# Patient Record
Sex: Male | Born: 1970 | Race: White | Hispanic: No | Marital: Married | Smoking: Current every day smoker
Health system: Southern US, Community
[De-identification: ages and names within clinical notes are randomized; demographics above are authoritative.]

## PROBLEM LIST (undated history)

## (undated) ENCOUNTER — Emergency Department (HOSPITAL_COMMUNITY)

## (undated) DIAGNOSIS — J449 Chronic obstructive pulmonary disease, unspecified: Secondary | ICD-10-CM

## (undated) DIAGNOSIS — J45909 Unspecified asthma, uncomplicated: Secondary | ICD-10-CM

## (undated) DIAGNOSIS — T7840XA Allergy, unspecified, initial encounter: Secondary | ICD-10-CM

## (undated) DIAGNOSIS — E7800 Pure hypercholesterolemia, unspecified: Secondary | ICD-10-CM

## (undated) DIAGNOSIS — J189 Pneumonia, unspecified organism: Secondary | ICD-10-CM

## (undated) HISTORY — DX: Unspecified asthma, uncomplicated: J45.909

## (undated) HISTORY — PX: OTHER SURGICAL HISTORY: SHX169

## (undated) HISTORY — DX: Allergy, unspecified, initial encounter: T78.40XA

## (undated) HISTORY — PX: LEG SURGERY: SHX1003

---

## 2001-02-25 ENCOUNTER — Emergency Department (HOSPITAL_COMMUNITY): Admission: EM | Admit: 2001-02-25 | Discharge: 2001-02-26 | Payer: Self-pay | Admitting: Emergency Medicine

## 2001-02-26 ENCOUNTER — Encounter: Payer: Self-pay | Admitting: Internal Medicine

## 2002-06-18 ENCOUNTER — Emergency Department (HOSPITAL_COMMUNITY): Admission: EM | Admit: 2002-06-18 | Discharge: 2002-06-19 | Payer: Self-pay | Admitting: Internal Medicine

## 2002-07-11 ENCOUNTER — Emergency Department (HOSPITAL_COMMUNITY): Admission: EM | Admit: 2002-07-11 | Discharge: 2002-07-11 | Payer: Self-pay | Admitting: Emergency Medicine

## 2005-02-10 ENCOUNTER — Emergency Department (HOSPITAL_COMMUNITY): Admission: EM | Admit: 2005-02-10 | Discharge: 2005-02-10 | Payer: Self-pay | Admitting: Emergency Medicine

## 2005-04-18 ENCOUNTER — Emergency Department (HOSPITAL_COMMUNITY): Admission: EM | Admit: 2005-04-18 | Discharge: 2005-04-18 | Payer: Self-pay | Admitting: Emergency Medicine

## 2005-05-11 ENCOUNTER — Emergency Department (HOSPITAL_COMMUNITY): Admission: EM | Admit: 2005-05-11 | Discharge: 2005-05-11 | Payer: Self-pay | Admitting: Emergency Medicine

## 2006-10-29 ENCOUNTER — Emergency Department (HOSPITAL_COMMUNITY): Admission: EM | Admit: 2006-10-29 | Discharge: 2006-10-29 | Payer: Self-pay | Admitting: Emergency Medicine

## 2007-02-20 ENCOUNTER — Emergency Department (HOSPITAL_COMMUNITY): Admission: EM | Admit: 2007-02-20 | Discharge: 2007-02-20 | Payer: Self-pay | Admitting: Emergency Medicine

## 2007-09-12 ENCOUNTER — Emergency Department (HOSPITAL_COMMUNITY): Admission: EM | Admit: 2007-09-12 | Discharge: 2007-09-12 | Payer: Self-pay | Admitting: Emergency Medicine

## 2008-02-01 ENCOUNTER — Ambulatory Visit (HOSPITAL_COMMUNITY): Admission: RE | Admit: 2008-02-01 | Discharge: 2008-02-01 | Payer: Self-pay | Admitting: Family Medicine

## 2008-04-11 ENCOUNTER — Emergency Department (HOSPITAL_COMMUNITY): Admission: EM | Admit: 2008-04-11 | Discharge: 2008-04-11 | Payer: Self-pay | Admitting: Emergency Medicine

## 2009-12-07 ENCOUNTER — Emergency Department (HOSPITAL_COMMUNITY): Admission: EM | Admit: 2009-12-07 | Discharge: 2009-12-07 | Payer: Self-pay | Admitting: Emergency Medicine

## 2010-09-19 ENCOUNTER — Ambulatory Visit (HOSPITAL_COMMUNITY): Admission: RE | Admit: 2010-09-19 | Discharge: 2010-09-19 | Payer: Self-pay | Admitting: Family Medicine

## 2011-02-21 ENCOUNTER — Emergency Department (HOSPITAL_COMMUNITY)
Admission: EM | Admit: 2011-02-21 | Discharge: 2011-02-22 | Disposition: A | Discharge status: Home / Self Care | Payer: PRIVATE HEALTH INSURANCE | Attending: Emergency Medicine | Admitting: Emergency Medicine

## 2011-02-21 DIAGNOSIS — Y929 Unspecified place or not applicable: Secondary | ICD-10-CM | POA: Insufficient documentation

## 2011-02-21 DIAGNOSIS — W57XXXA Bitten or stung by nonvenomous insect and other nonvenomous arthropods, initial encounter: Secondary | ICD-10-CM | POA: Insufficient documentation

## 2011-02-21 DIAGNOSIS — S90569A Insect bite (nonvenomous), unspecified ankle, initial encounter: Secondary | ICD-10-CM | POA: Insufficient documentation

## 2011-02-21 DIAGNOSIS — L089 Local infection of the skin and subcutaneous tissue, unspecified: Secondary | ICD-10-CM | POA: Insufficient documentation

## 2011-02-21 DIAGNOSIS — S30860A Insect bite (nonvenomous) of lower back and pelvis, initial encounter: Secondary | ICD-10-CM | POA: Insufficient documentation

## 2011-03-10 LAB — CBC
Hemoglobin: 16.2 g/dL (ref 13.0–17.0)
RDW: 13 % (ref 11.5–15.5)

## 2011-03-10 LAB — DIFFERENTIAL
Basophils Absolute: 0.1 10*3/uL (ref 0.0–0.1)
Lymphocytes Relative: 16 % (ref 12–46)
Monocytes Absolute: 0.6 10*3/uL (ref 0.1–1.0)
Neutro Abs: 8.5 10*3/uL — ABNORMAL HIGH (ref 1.7–7.7)
Neutrophils Relative %: 77 % (ref 43–77)

## 2011-03-10 LAB — BASIC METABOLIC PANEL
BUN: 5 mg/dL — ABNORMAL LOW (ref 6–23)
GFR calc non Af Amer: >60 mL/min (ref >60)
Potassium: 3.9 mEq/L (ref 3.5–5.1)
Sodium: 140 mEq/L (ref 135–145)
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
## 2012-08-13 ENCOUNTER — Emergency Department (HOSPITAL_COMMUNITY): Payer: PRIVATE HEALTH INSURANCE

## 2012-08-13 ENCOUNTER — Encounter (HOSPITAL_COMMUNITY): Payer: Self-pay | Admitting: *Deleted

## 2012-08-13 ENCOUNTER — Emergency Department (HOSPITAL_COMMUNITY)
Admission: EM | Admit: 2012-08-13 | Discharge: 2012-08-13 | Disposition: A | Discharge status: Home / Self Care | Payer: PRIVATE HEALTH INSURANCE | Attending: Emergency Medicine | Admitting: Emergency Medicine

## 2012-08-13 DIAGNOSIS — F17200 Nicotine dependence, unspecified, uncomplicated: Secondary | ICD-10-CM | POA: Insufficient documentation

## 2012-08-13 DIAGNOSIS — IMO0002 Reserved for concepts with insufficient information to code with codable children: Secondary | ICD-10-CM | POA: Insufficient documentation

## 2012-08-13 DIAGNOSIS — S9030XA Contusion of unspecified foot, initial encounter: Secondary | ICD-10-CM

## 2012-08-13 DIAGNOSIS — S90819A Abrasion, unspecified foot, initial encounter: Secondary | ICD-10-CM

## 2012-08-13 DIAGNOSIS — Y92009 Unspecified place in unspecified non-institutional (private) residence as the place of occurrence of the external cause: Secondary | ICD-10-CM | POA: Insufficient documentation

## 2012-08-13 DIAGNOSIS — W208XXA Other cause of strike by thrown, projected or falling object, initial encounter: Secondary | ICD-10-CM | POA: Insufficient documentation

## 2012-08-13 DIAGNOSIS — Z23 Encounter for immunization: Secondary | ICD-10-CM | POA: Insufficient documentation

## 2012-08-13 MED ORDER — TETANUS-DIPHTHERIA TOXOIDS TD 5-2 LFU IM INJ
0.5000 mL | INJECTION | Freq: Once | INTRAMUSCULAR | Status: AC
  Administered 2012-08-13: 0.5 mL via INTRAMUSCULAR
  Filled 2012-08-13: qty 0.5

## 2012-08-13 MED ORDER — HYDROCODONE-ACETAMINOPHEN 5-325 MG PO TABS: ORAL_TABLET | ORAL | Status: DC

## 2012-08-13 MED ORDER — TETANUS-DIPHTH-ACELL PERTUSSIS 5-2.5-18.5 LF-MCG/0.5 IM SUSP: 0.5000 mL | Freq: Once | INTRAMUSCULAR | Status: DC

## 2012-08-13 MED ORDER — TRAMADOL HCL 50 MG PO TABS: 50.0000 mg | ORAL_TABLET | Freq: Four times a day (QID) | ORAL | Status: AC | PRN

## 2012-08-13 MED ORDER — NAPROXEN 500 MG PO TABS: 500.0000 mg | ORAL_TABLET | Freq: Two times a day (BID) | ORAL | Status: DC

## 2012-08-13 NOTE — ED Notes (Signed)
Plywood board fell on pts foot this pm.  Abrasion present.  Alert, NAD

## 2012-08-13 NOTE — ED Notes (Signed)
vicodin dose pack  Given to pt

## 2012-08-13 NOTE — ED Notes (Signed)
Piece of plywood fell on left foot, c/o left foot pain, swelling noted

## 2012-08-13 NOTE — ED Provider Notes (Signed)
History     CSN: [id number]  Arrival date & time 08/13/12  2122   First MD Initiated Contact with Patient 08/13/12 2225      Chief Complaint  Patient presents with  . Foot Pain    (Consider location/radiation/quality/duration/timing/severity/associated sxs/prior treatment) HPI Comments: Patient c/o pain and abrasion to the dorsal left foot.  States a large piece of plywood fell on his foot.  Was wearing sandals at the time.  Pain is worse with weight bearing.  Has not tried anything to help alleviate the pain.  Denies bleeding, numbness or weakness.  Unsure of last tetanus vaccine  Patient is a 41 y.o. male presenting with foot injury. The history is provided by the patient.  Foot Injury  The incident occurred 1 to 2 hours ago. The incident occurred at home. The injury mechanism was a direct blow. The pain is present in the left foot. The quality of the pain is described as aching. The pain is moderate. The pain has been constant since onset. Pertinent negatives include no numbness, no inability to bear weight, no loss of motion, no muscle weakness, no loss of sensation and no tingling. He reports no foreign bodies present. The symptoms are aggravated by activity, bearing weight and palpation. He has tried nothing for the symptoms. The treatment provided no relief.    History reviewed. No pertinent past medical history.  Past Surgical History  Procedure Date  . Arm surgery   . Leg surgery     History reviewed. No pertinent family history.  History  Substance Use Topics  . Smoking status: Current Everyday Smoker  . Smokeless tobacco: Not on file  . Alcohol Use: No      Review of Systems  Constitutional: Negative for fever and chills.  Genitourinary: Negative for dysuria and difficulty urinating.  Musculoskeletal: Positive for joint swelling and arthralgias.  Skin: Positive for wound. Negative for color change.       Abrasion to the left foot  Neurological: Negative for  tingling and numbness.  All other systems reviewed and are negative.    Allergies  Review of patient's allergies indicates no known allergies.  Home Medications  No current outpatient prescriptions on file.  BP 144/76  Pulse 72  Temp 98.2 F (36.8 C) (Oral)  Resp 20  Ht 5\' 5"  (1.651 m)  Wt 215 lb (97.523 kg)  BMI 35.78 kg/m2  SpO2 100%  Physical Exam  Nursing note and vitals reviewed. Constitutional: He is oriented to person, place, and time. He appears well-developed and well-nourished. No distress.  HENT:  Head: Normocephalic and atraumatic.  Cardiovascular: Normal rate, regular rhythm, normal heart sounds and intact distal pulses.   Pulmonary/Chest: Effort normal and breath sounds normal.  Musculoskeletal: He exhibits edema and tenderness.       Left foot: He exhibits tenderness, bony tenderness and swelling. He exhibits normal range of motion, normal capillary refill, no crepitus and no deformity.       Feet:       ttp of the dorsal left foot, mild abrasion present.  moderate STS is present.  ROM is preserved.  DP pulse is brisk, sensation intact.  No erythema, bruising or deformity.    Neurological: He is alert and oriented to person, place, and time. He exhibits normal muscle tone. Coordination normal.  Skin: Skin is warm and dry.    ED Course  Procedures (including critical care time)  Labs Reviewed - No data to display Dg Foot Complete Left  08/13/2012  *RADIOLOGY REPORT*  Clinical Data: Object fell on foot.  Foot pain and swelling.  LEFT FOOT - COMPLETE 3+ VIEW  Comparison: None.  Findings: Soft tissue swelling is seen along the dorsal aspect of the midfoot.  No evidence of fracture or dislocation.  No other significant bone abnormality identified.  No evidence of soft tissue gas or radiopaque foreign body.  IMPRESSION: Dorsal midfoot soft tissue swelling.  No osseous abnormality.   Original Report Authenticated By: Danae Orleans, M.D.       Abrasion cleaned and  bandaged.  MDM    Abrasion to dorsal left foot, with moderate STS.  No focal neuro deficits.  Pt has own crutches.  Agrees to close f/u with orthopedics   The patient appears reasonably screened and/or stabilized for discharge and I doubt any other medical condition or other [facility] requiring further screening, evaluation, or treatment in the ED at this time prior to discharge.    Prescribed: Naprosyn Ultram #15    Kayanna Mckillop L. Lowry Bala, [place] 08/16/12 1720

## 2012-08-17 MED FILL — Hydrocodone-Acetaminophen Tab 5-325 MG: ORAL | Qty: 6 | Status: AC

## 2012-08-19 NOTE — ED Provider Notes (Signed)
Medical screening examination/treatment/procedure(s) were performed by non-physician practitioner and as supervising physician I was immediately available for consultation/collaboration.   Morganna Styles L Bethany Hirt, MD 08/19/12 1027 

## 2012-08-23 ENCOUNTER — Encounter: Payer: Self-pay | Admitting: Orthopedic Surgery

## 2012-08-23 ENCOUNTER — Ambulatory Visit: Payer: PRIVATE HEALTH INSURANCE | Admitting: Orthopedic Surgery

## 2012-08-23 VITALS — BP 116/80 | Ht 65.0 in | Wt 215.0 lb

## 2012-08-23 DIAGNOSIS — S9030XA Contusion of unspecified foot, initial encounter: Secondary | ICD-10-CM | POA: Insufficient documentation

## 2012-08-23 MED ORDER — NAPROXEN 500 MG PO TABS: 500.0000 mg | ORAL_TABLET | Freq: Two times a day (BID) | ORAL | Status: DC

## 2012-08-23 MED ORDER — HYDROCODONE-ACETAMINOPHEN 5-325 MG PO TABS: 1.0000 | ORAL_TABLET | ORAL | Status: DC | PRN

## 2012-08-23 NOTE — Progress Notes (Signed)
Patient ID: Tony Doyle, male   DOB: January 14, 1971, 41 y.o.   MRN: [id number] Chief complaint pain right foot  Date of injury September 6 The patient dropped wood on his foot ER visit negative x-ray foot and ankle Complains of burning 8/10 pain over the dorsum of the foot with swelling and when the swelling is bad numbness and tingling is also or bruise and a superficial laceration/abrasion. There he listed all of his review of systems is normal  The history has been recorded and reviewed  BP 116/80  Ht 5\' 5"  (1.651 m)  Wt 97.523 kg (215 lb)  BMI 35.78 kg/m2 The exam shows a medium to large male adequate grooming. Adequate hygiene. He is oriented x3 his mood and affect are normal exam to the progress crutches nonweightbearing  His left foot has an abrasion on the dorsum is tender and swollen. His ankle range of motion is normal his ankle is stable his muscle tone is normal skin has an abrasion as stated temperature is normal there is mild edema at the ankle lymph nodes are negative sensations intact no pathologic reflexes his balance is good.  X-rays of the ankle and foot are negative  Impression contusion left foot  Recommend short cam walker progressive weightbearing as tolerated use soaks with Epsom salt to control swelling progressive use of crutches and DCS tolerated  If not improved return in 6 weeks

## 2012-08-23 NOTE — Patient Instructions (Addendum)
Walk on foot as tolerated  Apply weight as tolerated in walking boot  Soak foot in warm water with epsom salt do it for 30 minutes  Should get better in 4-6 weeks if not call us back      Foot Contusion A contusion is a deep bruise. Bruises happen when an injury causes bleeding under the skin. Signs of bruising include pain, puffiness (swelling), and discolored skin. The bruise may turn blue, purple, or yellow. A bruised foot may be painful to walk on. Puffiness may make it hard to move the foot. HOME CARE  Put ice on the injured area.   Put ice in a plastic bag.   Place a towel between your skin and the bag.   Leave the ice on for 15 to 20 minutes, 3 to 4 times a day.   Use an elastic bandage to keep the puffiness down.   Keep the foot raised (elevated) above the level of the heart.   Avoid standing or walking on the foot.   Only take medicine as told by your doctor.   Eat healthy.   See your doctor for a follow-up visit as told.  GET HELP RIGHT AWAY IF:    The pain and puffiness get worse.   You have a headache, muscle ache, or you feel dizzy and ill.   The pain is not controlled with medicine.   You have a temperature by mouth above 102 F (38.9 C), not controlled by medicine.   The foot feels warm and it is painful to move your toes.   The bruise is not getting better.   There is yellowish white fluid (pus) coming from the wound.   You lose feeling (numbness) in the foot.   The foot feels cold.   There are new problems.  MAKE SURE YOU:    Understand these instructions.   Will watch this condition.   Will get help right away if you or your child is not doing well or gets worse.  Document Released: 09/02/2008 Document Revised: 11/13/2011 Document Reviewed: 10/28/2011 [facility] Patient Information 2012 [place], [place].

## 2012-08-23 NOTE — Addendum Note (Signed)
Addended by: Vickki Hearing on: 08/23/2012 02:31 PM   Modules accepted: Level of Service

## 2012-09-03 ENCOUNTER — Telehealth: Payer: Self-pay | Admitting: Orthopedic Surgery

## 2012-09-03 NOTE — Telephone Encounter (Signed)
Tony Doyle wants to know how long he needs to wear the brace.  Not scheduled to return unless he is no better. His # [phone]

## 2012-09-29 NOTE — Telephone Encounter (Signed)
No note from doctor °

## 2012-10-08 ENCOUNTER — Other Ambulatory Visit (HOSPITAL_COMMUNITY): Payer: Self-pay | Admitting: Family Medicine

## 2012-10-08 ENCOUNTER — Ambulatory Visit (HOSPITAL_COMMUNITY)
Admission: RE | Admit: 2012-10-08 | Discharge: 2012-10-08 | Disposition: A | Discharge status: Home / Self Care | Payer: PRIVATE HEALTH INSURANCE | Source: Ambulatory Visit | Attending: Family Medicine | Admitting: Family Medicine

## 2012-10-08 DIAGNOSIS — J4489 Other specified chronic obstructive pulmonary disease: Secondary | ICD-10-CM | POA: Insufficient documentation

## 2012-10-08 DIAGNOSIS — J449 Chronic obstructive pulmonary disease, unspecified: Secondary | ICD-10-CM | POA: Insufficient documentation

## 2012-10-08 DIAGNOSIS — F17200 Nicotine dependence, unspecified, uncomplicated: Secondary | ICD-10-CM | POA: Insufficient documentation

## 2012-10-08 DIAGNOSIS — F17210 Nicotine dependence, cigarettes, uncomplicated: Secondary | ICD-10-CM

## 2013-04-17 ENCOUNTER — Emergency Department (HOSPITAL_COMMUNITY)
Admission: EM | Admit: 2013-04-17 | Discharge: 2013-04-17 | Disposition: A | Discharge status: Home / Self Care | Payer: PRIVATE HEALTH INSURANCE | Attending: Emergency Medicine | Admitting: Emergency Medicine

## 2013-04-17 ENCOUNTER — Encounter (HOSPITAL_COMMUNITY): Payer: Self-pay | Admitting: *Deleted

## 2013-04-17 DIAGNOSIS — S81009A Unspecified open wound, unspecified knee, initial encounter: Secondary | ICD-10-CM | POA: Insufficient documentation

## 2013-04-17 DIAGNOSIS — F17200 Nicotine dependence, unspecified, uncomplicated: Secondary | ICD-10-CM | POA: Insufficient documentation

## 2013-04-17 DIAGNOSIS — Y929 Unspecified place or not applicable: Secondary | ICD-10-CM | POA: Insufficient documentation

## 2013-04-17 DIAGNOSIS — W268XXA Contact with other sharp object(s), not elsewhere classified, initial encounter: Secondary | ICD-10-CM | POA: Insufficient documentation

## 2013-04-17 DIAGNOSIS — Z79899 Other long term (current) drug therapy: Secondary | ICD-10-CM | POA: Insufficient documentation

## 2013-04-17 DIAGNOSIS — Y939 Activity, unspecified: Secondary | ICD-10-CM | POA: Insufficient documentation

## 2013-04-17 DIAGNOSIS — L089 Local infection of the skin and subcutaneous tissue, unspecified: Secondary | ICD-10-CM

## 2013-04-17 MED ORDER — SULFAMETHOXAZOLE-TRIMETHOPRIM 800-160 MG PO TABS: 1.0000 | ORAL_TABLET | Freq: Two times a day (BID) | ORAL | Status: DC

## 2013-04-17 NOTE — ED Notes (Signed)
Pt states that he hit his left lower leg against a piece of treated lumber, was able to remove part of the wood, is uncertain if he was able to remove all of it or not, c/o pain, burning sensation to left lower leg, pt has red area with small scab noted to  Center of left lower leg area. Cms intact distal

## 2013-04-17 NOTE — ED Provider Notes (Signed)
History     CSN: [id number]  Arrival date & time 04/17/13  1516   First MD Initiated Contact with Patient 04/17/13 1607      Chief Complaint  Patient presents with  . Leg Pain    (Consider location/radiation/quality/duration/timing/severity/associated sxs/prior treatment) HPI Comments: Tony Doyle is a 42 y.o. Male presenting with pain in his left lower leg since he had a puncture wound from a 6x6 treated piece of lumber 1 week ago.  He had a large caliber sliver which he removed without difficulty,  But now continues to have pain and redness surrounding the site.  He has been using hydrogen peroxide daily to clean the site.  He squeezed it last night and obtained a small amount of pus.  He denies radiation of pain,  No fevers or chills, no nausea.  He has been taking naproxen which he though was an antibiotic and has not obtained improvement.     The history is provided by the patient.    History reviewed. No pertinent past medical history.  Past Surgical History  Procedure Laterality Date  . Arm surgery      LEFT  . Leg surgery      LEFT    Family History  Problem Relation Age of Onset  . Heart disease    . Arthritis    . Lung disease    . Asthma    . Diabetes      History  Substance Use Topics  . Smoking status: Current Every Day Smoker  . Smokeless tobacco: Not on file  . Alcohol Use: No      Review of Systems  Constitutional: Negative for fever and chills.  HENT: Negative for facial swelling.   Respiratory: Negative for shortness of breath and wheezing.   Skin: Positive for wound.  Neurological: Negative for numbness.    Allergies  Review of patient's allergies indicates no known allergies.  Home Medications   Current Outpatient Rx  Name  Route  Sig  Dispense  Refill  . atorvastatin (LIPITOR) 20 MG tablet   Oral   Take 20 mg by mouth at bedtime.         . sulfamethoxazole-trimethoprim (SEPTRA DS) 800-160 MG per tablet   Oral   Take 1  tablet by mouth every 12 (twelve) hours.   20 tablet   0     BP 148/67  Pulse 73  Temp(Src) 97.1 F (36.2 C) (Oral)  Resp 20  SpO2 99%  Physical Exam  Constitutional: He appears well-developed and well-nourished. No distress.  HENT:  Head: Normocephalic.  Neck: Neck supple.  Cardiovascular: Normal rate.   Pulmonary/Chest: Effort normal. He has no wheezes.  Musculoskeletal: Normal range of motion. He exhibits no edema.  Skin: There is erythema.  Central scab surrounding a 1 cm surrounding area of erythema.  No fluctuance,  No induration,  No active drainage, no red streaking.    ED Course  Procedures (including critical care time)  Labs Reviewed - No data to display No results found.   1. Skin infection    Bedside US used to assess for retained fb and abscess pocket,  Neither of which was found.   MDM  Pt advised to stop using peroxide,  Instead,  Warm soap and water,  Warm epsom salt soaks.  Placed on bactrim.  To see pcp for recheck if not improving,  Or if sx worsen over the next several days.        Raynelle Fanning  Arminda Foglio, PA-C 04/17/13 1727

## 2013-04-18 NOTE — ED Provider Notes (Signed)
Medical screening examination/treatment/procedure(s) were performed by non-physician practitioner and as supervising physician I was immediately available for consultation/collaboration.   Carleene Cooper III, MD 04/18/13 1540

## 2013-10-29 ENCOUNTER — Encounter (HOSPITAL_COMMUNITY): Payer: Self-pay | Admitting: Emergency Medicine

## 2013-10-29 ENCOUNTER — Emergency Department (HOSPITAL_COMMUNITY): Payer: Medicare Other

## 2013-10-29 ENCOUNTER — Emergency Department (HOSPITAL_COMMUNITY)
Admission: EM | Admit: 2013-10-29 | Discharge: 2013-10-29 | Disposition: A | Discharge status: Home / Self Care | Payer: Medicare Other | Attending: Emergency Medicine | Admitting: Emergency Medicine

## 2013-10-29 DIAGNOSIS — Z792 Long term (current) use of antibiotics: Secondary | ICD-10-CM | POA: Insufficient documentation

## 2013-10-29 DIAGNOSIS — IMO0001 Reserved for inherently not codable concepts without codable children: Secondary | ICD-10-CM | POA: Insufficient documentation

## 2013-10-29 DIAGNOSIS — J069 Acute upper respiratory infection, unspecified: Secondary | ICD-10-CM | POA: Insufficient documentation

## 2013-10-29 DIAGNOSIS — F17200 Nicotine dependence, unspecified, uncomplicated: Secondary | ICD-10-CM | POA: Insufficient documentation

## 2013-10-29 DIAGNOSIS — R6883 Chills (without fever): Secondary | ICD-10-CM | POA: Insufficient documentation

## 2013-10-29 DIAGNOSIS — Z79899 Other long term (current) drug therapy: Secondary | ICD-10-CM | POA: Insufficient documentation

## 2013-10-29 MED ORDER — DOXYCYCLINE HYCLATE 100 MG PO TABS
100.0000 mg | ORAL_TABLET | Freq: Once | ORAL | Status: AC
  Administered 2013-10-29: 100 mg via ORAL
  Filled 2013-10-29: qty 1

## 2013-10-29 MED ORDER — MINOCYCLINE HCL 100 MG PO CAPS: 100.0000 mg | ORAL_CAPSULE | Freq: Two times a day (BID) | ORAL | Status: DC

## 2013-10-29 MED ORDER — PSEUDOEPHED HCL-CPM-DM HBR TAN 30-4-30 MG/5ML PO SUSP: ORAL | Status: DC

## 2013-10-29 MED ORDER — ALBUTEROL SULFATE HFA 108 (90 BASE) MCG/ACT IN AERS
2.0000 | INHALATION_SPRAY | Freq: Once | RESPIRATORY_TRACT | Status: AC
  Administered 2013-10-29: 2 via RESPIRATORY_TRACT
  Filled 2013-10-29: qty 6.7

## 2013-10-29 MED ORDER — PREDNISONE 10 MG PO TABS: ORAL_TABLET | ORAL | Status: DC

## 2013-10-29 NOTE — ED Notes (Signed)
Pt cough nonproductive for 3 days, fever unknown, deneis N/v/d

## 2013-10-29 NOTE — ED Provider Notes (Signed)
CSN: [id number]     Arrival date & time 10/29/13  1635 History   First MD Initiated Contact with Patient 10/29/13 1648     Chief Complaint  Patient presents with  . Cough   (Consider location/radiation/quality/duration/timing/severity/associated sxs/prior Treatment) Patient is a 42 y.o. male presenting with cough. The history is provided by the patient.  Cough Cough characteristics:  Non-productive Severity:  Moderate Onset quality:  Gradual Duration:  3 days Timing:  Intermittent Progression:  Worsening Chronicity:  New Smoker: yes   Context: upper respiratory infection and weather changes   Relieved by:  Nothing Worsened by:  Nothing tried Ineffective treatments:  None tried Associated symptoms: chills, myalgias, rhinorrhea and sinus congestion   Associated symptoms: no chest pain, no eye discharge, no rash, no shortness of breath and no wheezing     History reviewed. No pertinent past medical history. Past Surgical History  Procedure Laterality Date  . Arm surgery      LEFT  . Leg surgery      LEFT   Family History  Problem Relation Age of Onset  . Heart disease    . Arthritis    . Lung disease    . Asthma    . Diabetes     History  Substance Use Topics  . Smoking status: Current Every Day Smoker    Types: Cigarettes  . Smokeless tobacco: Not on file  . Alcohol Use: No    Review of Systems  Constitutional: Positive for chills. Negative for activity change.       All ROS Neg except as noted in HPI  HENT: Positive for rhinorrhea. Negative for nosebleeds.   Eyes: Negative for photophobia and discharge.  Respiratory: Positive for cough. Negative for shortness of breath and wheezing.   Cardiovascular: Negative for chest pain and palpitations.  Gastrointestinal: Negative for abdominal pain and blood in stool.  Genitourinary: Negative for dysuria, frequency and hematuria.  Musculoskeletal: Positive for arthralgias and myalgias. Negative for back pain and neck  pain.  Skin: Negative.  Negative for rash.  Neurological: Negative for dizziness, seizures and speech difficulty.  Psychiatric/Behavioral: Negative for hallucinations and confusion.    Allergies  Review of patient's allergies indicates no known allergies.  Home Medications   Current Outpatient Rx  Name  Route  Sig  Dispense  Refill  . atorvastatin (LIPITOR) 20 MG tablet   Oral   Take 20 mg by mouth at bedtime.         . sulfamethoxazole-trimethoprim (SEPTRA DS) 800-160 MG per tablet   Oral   Take 1 tablet by mouth every 12 (twelve) hours.   20 tablet   0    BP 131/72  Pulse 78  Temp(Src) 97.9 F (36.6 C) (Oral)  Resp 20  Ht 5\' 3"  (1.6 m)  Wt 220 lb (99.791 kg)  BMI 38.98 kg/m2  SpO2 97% Physical Exam  Nursing note and vitals reviewed. Constitutional: He is oriented to person, place, and time. He appears well-developed and well-nourished.  Non-toxic appearance.  HENT:  Head: Normocephalic.  Right Ear: Tympanic membrane and external ear normal.  Left Ear: Tympanic membrane and external ear normal.  Mouth/Throat: No oropharyngeal exudate.  Nasal congestion.  Eyes: EOM and lids are normal. Pupils are equal, round, and reactive to light.  Neck: Normal range of motion. Neck supple. Carotid bruit is not present.  Cardiovascular: Normal rate, regular rhythm, normal heart sounds, intact distal pulses and normal pulses.   Pulmonary/Chest: Breath sounds normal. No respiratory distress.  Course breath sounds present.  Abdominal: Soft. Bowel sounds are normal. There is no tenderness. There is no guarding.  Musculoskeletal: Normal range of motion.  Lymphadenopathy:       Head (right side): No submandibular adenopathy present.       Head (left side): No submandibular adenopathy present.    He has no cervical adenopathy.  Neurological: He is alert and oriented to person, place, and time. He has normal strength. No cranial nerve deficit or sensory deficit.  Skin: Skin is warm  and dry. No rash noted.  Psychiatric: He has a normal mood and affect. His speech is normal.    ED Course  Procedures (including critical care time) Labs Review Labs Reviewed - No data to display Imaging Review No results found.  EKG Interpretation   None       MDM  No diagnosis found. *I have reviewed nursing notes, vital signs, and all appropriate lab and imaging results for this patient.  Exam/history  is consistent with URI and bronchitis. Vital signs stable. Pulse ox 97% on room air. WNL by my interpretation.  No high fever or heart rate or low pulse ox, doubt pneumonia. Pt is a smoker and has hx of asthma. Plan: Rx for minocycline, dextromethorphan cough medication, and prednisone. Albuterol inhaler given. Pt to follow up with PCP next week.   Kathie Dike, PA-C 10/29/13 1732

## 2013-10-30 NOTE — ED Provider Notes (Signed)
Medical screening examination/treatment/procedure(s) were performed by non-physician practitioner and as supervising physician I was immediately available for consultation/collaboration.  EKG Interpretation   None       Dayan Desa, MD, FACEP   Vona Whiters L Kyleeann Cremeans, MD 10/30/13 0036 

## 2014-02-10 ENCOUNTER — Ambulatory Visit (HOSPITAL_COMMUNITY)
Admission: RE | Admit: 2014-02-10 | Discharge: 2014-02-10 | Disposition: A | Discharge status: Home / Self Care | Payer: Medicare Other | Source: Ambulatory Visit | Attending: Family Medicine | Admitting: Family Medicine

## 2014-02-10 ENCOUNTER — Other Ambulatory Visit (HOSPITAL_COMMUNITY): Payer: Self-pay | Admitting: Family Medicine

## 2014-02-10 DIAGNOSIS — R059 Cough, unspecified: Secondary | ICD-10-CM | POA: Insufficient documentation

## 2014-02-10 DIAGNOSIS — R05 Cough: Secondary | ICD-10-CM | POA: Insufficient documentation

## 2014-02-10 DIAGNOSIS — F17210 Nicotine dependence, cigarettes, uncomplicated: Secondary | ICD-10-CM

## 2014-02-10 DIAGNOSIS — R079 Chest pain, unspecified: Secondary | ICD-10-CM | POA: Insufficient documentation

## 2014-02-19 ENCOUNTER — Emergency Department (HOSPITAL_COMMUNITY): Payer: Medicare Other

## 2014-02-19 ENCOUNTER — Encounter (HOSPITAL_COMMUNITY): Payer: Self-pay | Admitting: Emergency Medicine

## 2014-02-19 ENCOUNTER — Emergency Department (HOSPITAL_COMMUNITY)
Admission: EM | Admit: 2014-02-19 | Discharge: 2014-02-19 | Disposition: A | Discharge status: Home / Self Care | Payer: Medicare Other | Attending: Emergency Medicine | Admitting: Emergency Medicine

## 2014-02-19 DIAGNOSIS — Z79899 Other long term (current) drug therapy: Secondary | ICD-10-CM | POA: Insufficient documentation

## 2014-02-19 DIAGNOSIS — F17200 Nicotine dependence, unspecified, uncomplicated: Secondary | ICD-10-CM | POA: Insufficient documentation

## 2014-02-19 DIAGNOSIS — R079 Chest pain, unspecified: Secondary | ICD-10-CM | POA: Insufficient documentation

## 2014-02-19 DIAGNOSIS — R0781 Pleurodynia: Secondary | ICD-10-CM

## 2014-02-19 LAB — CBC
HCT: 43.2 % (ref 39.0–52.0)
Hemoglobin: 14.9 g/dL (ref 13.0–17.0)
MCH: 29.2 pg (ref 26.0–34.0)
MCHC: 34.5 g/dL (ref 30.0–36.0)
MCV: 84.5 fL (ref 78.0–100.0)
PLATELETS: 169 10*3/uL (ref 150–400)
RBC: 5.11 MIL/uL (ref 4.22–5.81)
RDW: 12.9 % (ref 11.5–15.5)
WBC: 7.2 10*3/uL (ref 4.0–10.5)

## 2014-02-19 LAB — BASIC METABOLIC PANEL
BUN: 8 mg/dL (ref 6–23)
CALCIUM: 9.5 mg/dL (ref 8.4–10.5)
CO2: 27 mEq/L (ref 19–32)
Chloride: 101 mEq/L (ref 96–112)
Creatinine, Ser: 0.88 mg/dL (ref 0.50–1.35)
GLUCOSE: 122 mg/dL — AB (ref 70–99)
Potassium: 3.9 mEq/L (ref 3.7–5.3)
Sodium: 138 mEq/L (ref 137–147)

## 2014-02-19 MED ORDER — KETOROLAC TROMETHAMINE 30 MG/ML IJ SOLN
30.0000 mg | Freq: Once | INTRAMUSCULAR | Status: AC
  Administered 2014-02-19: 30 mg via INTRAMUSCULAR
  Filled 2014-02-19: qty 1

## 2014-02-19 MED ORDER — IBUPROFEN 800 MG PO TABS
800.0000 mg | ORAL_TABLET | Freq: Three times a day (TID) | ORAL | Status: DC
Start: 2014-02-19 — End: 2014-02-21

## 2014-02-19 NOTE — Discharge Instructions (Signed)
As discussed, your evaluation is largely reassuring.  However, with your pain in your x-ray changes it is important that you follow up with our pulmonologists for further evaluation and management.  Return here for concerning changes in your condition, otherwise physical medication as directed, and monitor your condition carefully.   Chest Pain (Nonspecific) It is often hard to give a specific diagnosis for the cause of chest pain. There is always a chance that your pain could be related to something serious, such as a heart attack or a blood clot in the lungs. You need to follow up with your caregiver for further evaluation. CAUSES   Heartburn.  Pneumonia or bronchitis.  Anxiety or stress.  Inflammation around your heart (pericarditis) or lung (pleuritis or pleurisy).  A blood clot in the lung.  A collapsed lung (pneumothorax). It can develop suddenly on its own (spontaneous pneumothorax) or from injury (trauma) to the chest.  Shingles infection (herpes zoster virus). The chest wall is composed of bones, muscles, and cartilage. Any of these can be the source of the pain.  The bones can be bruised by injury.  The muscles or cartilage can be strained by coughing or overwork.  The cartilage can be affected by inflammation and become sore (costochondritis). DIAGNOSIS  Lab tests or other studies, such as X-rays, electrocardiography, stress testing, or cardiac imaging, may be needed to find the cause of your pain.  TREATMENT   Treatment depends on what may be causing your chest pain. Treatment may include:  Acid blockers for heartburn.  Anti-inflammatory medicine.  Pain medicine for inflammatory conditions.  Antibiotics if an infection is present.  You may be advised to change lifestyle habits. This includes stopping smoking and avoiding alcohol, caffeine, and chocolate.  You may be advised to keep your head raised (elevated) when sleeping. This reduces the chance of acid  going backward from your stomach into your esophagus.  Most of the time, nonspecific chest pain will improve within 2 to 3 days with rest and mild pain medicine. HOME CARE INSTRUCTIONS   If antibiotics were prescribed, take your antibiotics as directed. Finish them even if you start to feel better.  For the next few days, avoid physical activities that bring on chest pain. Continue physical activities as directed.  Do not smoke.  Avoid drinking alcohol.  Only take over-the-counter or prescription medicine for pain, discomfort, or fever as directed by your caregiver.  Follow your caregiver's suggestions for further testing if your chest pain does not go away.  Keep any follow-up appointments you made. If you do not go to an appointment, you could develop lasting (chronic) problems with pain. If there is any problem keeping an appointment, you must call to reschedule. SEEK MEDICAL CARE IF:   You think you are having problems from the medicine you are taking. Read your medicine instructions carefully.  Your chest pain does not go away, even after treatment.  You develop a rash with blisters on your chest. SEEK IMMEDIATE MEDICAL CARE IF:   You have increased chest pain or pain that spreads to your arm, neck, jaw, back, or abdomen.  You develop shortness of breath, an increasing cough, or you are coughing up blood.  You have severe back or abdominal pain, feel nauseous, or vomit.  You develop severe weakness, fainting, or chills.  You have a fever. THIS IS AN EMERGENCY. Do not wait to see if the pain will go away. Get medical help at once. Call your local emergency services (  911 in [place] Do not drive yourself to the hospital. MAKE SURE YOU:   Understand these instructions.  Will watch your condition.  Will get help right away if you are not doing well or get worse. Document Released: 09/03/2005 Document Revised: 02/16/2012 Document Reviewed: 06/29/2008 [facility] Patient  Information 2014 [place], [place].

## 2014-02-19 NOTE — ED Provider Notes (Signed)
CSN: [id number]     Arrival date & time 02/19/14  2114 History  This chart was scribed for Gerhard Munchobert Kathyrn Warmuth, MD by Bennett Scrapehristina Taylor, ED Scribe. This patient was seen in room APA12/APA12 and the patient's care was started at 10:08 PM.  Chief Complaint  Patient presents with  . Abdominal Pain     The history is provided by the patient. No language interpreter was used.    HPI Comments: Tony Doyle is a 43 y.o. male who presents to the Emergency Department complaining of LUQ abdominal pain/lower left inferior rib cage. He states that coughing, changing positions make the pain worse. He denies any nausea, vomiting or diarrhea, rash, SOB. He is a current everyday smoker but denies any alcohol use. He denies any recent fall or injury. He is currently on disability for arm and leg fx from [employer]. He states that he was prescribed antibiotics with no improvement. He denies being giving anything for pain. hemoptysis one week ago. CXR was normal. Was advised to try destin with improvement  He denies any pulmonary disease.   History reviewed. No pertinent past medical history. Past Surgical History  Procedure Laterality Date  . Arm surgery      LEFT  . Leg surgery      LEFT   Family History  Problem Relation Age of Onset  . Heart disease    . Arthritis    . Lung disease    . Asthma    . Diabetes     History  Substance Use Topics  . Smoking status: Current Every Day Smoker    Types: Cigarettes  . Smokeless tobacco: Not on file  . Alcohol Use: No    Review of Systems  Constitutional:       Per HPI, otherwise negative  HENT:       Per HPI, otherwise negative  Respiratory:       Per HPI, otherwise negative  Cardiovascular:       Per HPI, otherwise negative  Gastrointestinal: Negative for vomiting.  Endocrine:       Negative aside from HPI  Genitourinary:       Neg aside from HPI   Musculoskeletal:       Per HPI, otherwise negative  Skin: Negative.   Neurological: Negative for  syncope.      Allergies  Review of patient's allergies indicates no known allergies.  Home Medications   Current Outpatient Rx  Name  Route  Sig  Dispense  Refill  . atorvastatin (LIPITOR) 20 MG tablet   Oral   Take 20 mg by mouth at bedtime.         . predniSONE (DELTASONE) 10 MG tablet      5,4,3,2,1 - take with food   15 tablet   0    Triage Vitals: BP 129/81  Pulse 79  Temp(Src) 97.7 F (36.5 C) (Oral)  Resp 20  Ht 5\' 4"  (1.626 m)  Wt 227 lb 2 oz (103.023 kg)  BMI 38.97 kg/m2  SpO2 96%  Physical Exam  Nursing note and vitals reviewed. Constitutional: He is oriented to person, place, and time. He appears well-developed and well-nourished. No distress.  HENT:  Head: Normocephalic and atraumatic.  Eyes: Conjunctivae and EOM are normal.  Cardiovascular: Normal rate and regular rhythm.   Pulmonary/Chest: Effort normal and breath sounds normal. No stridor. No respiratory distress. He exhibits tenderness (tenderness to left inferior rib cage).  Abdominal: He exhibits no distension.  Musculoskeletal: Normal range of  motion. He exhibits no edema.  Neurological: He is alert and oriented to person, place, and time.  Skin: Skin is warm and dry. No rash noted.  Psychiatric: He has a normal mood and affect.    ED Course  Procedures (including critical care time)  DIAGNOSTIC STUDIES: Oxygen Saturation is 96% on RA, adequate by my interpretation.    COORDINATION OF CARE: 10:11 PM-Reviewed pt's prior CXR in the room with pt. interstitial lung disease changes noted. Discussed treatment plan which includes  (CXR, CBC panel, CMP) with pt at bedside and pt agreed to plan.   11:36 PM Patient substantially improved with parietal. I reviewed his x-ray, all findings with him.  With his improvement he will be discharged.  He voices understanding of the followup instructions, return precautions.  Labs Review Labs Reviewed  BASIC METABOLIC PANEL - Abnormal; Notable for the  following:    Glucose, Bld 122 (*)    All other components within normal limits  CBC   Imaging Review Dg Chest 2 View  02/19/2014   CLINICAL DATA:  Pain.  EXAM: CHEST  2 VIEW  COMPARISON:  DG CHEST 2 VIEW dated 02/10/2014; DG CHEST 2 VIEW dated 10/29/2013; DG CHEST 2 VIEW dated 10/08/2012  FINDINGS: Mediastinum and hilar structures are normal. Increased interstitial markings in the lung bases suggesting pneumonitis. Underlying chronic interstitial fibrosis most likely present. No pleural effusion or pneumothorax. Heart size and pulmonary vascularity normal. No acute bony abnormality.  IMPRESSION: 1. Increased interstitial markings both lung bases suggesting pneumonitis. 2. Underlying chronic interstitial lung disease.   Electronically Signed   By: Maisie Fus  Register   On: 02/19/2014 22:33    MDM    I personally performed the services described in this documentation, which was scribed in my presence. The recorded information has been reviewed and is accurate.   Patient presents with ongoing left lower rib pain.  On exam he is awake alert, hemodynamically stable. Patient is tenderness to palpation about the left lower ribs, but no other notable physical exam findings. Patient is a smoker, and there was counseled on the need to stop this. Chart review demonstrates the patient has had x-ray evidence of interstitial changes in the past, though he is unaware of this. I discussed this with the patient at length, and absent distress, with reassuring findings, improvement subjectively, stable labs are discharged in a stable condition. With no other CP, resolution of Sx, and the chronicity of his pain atypical acs seems unlikely. Patient is not hypoxic / tachypnic, tachycardic - all reassuring for the low suspicion of PE as well.    Gerhard Munch, MD 02/19/14 2340

## 2014-02-19 NOTE — ED Notes (Signed)
Pt c/o luq pin that feels like a knife is stabbing him. Pt saw Dr. Renard Matter and had xrays and everything checked out fine. Pt c/o continued pain.

## 2014-02-21 ENCOUNTER — Ambulatory Visit (INDEPENDENT_AMBULATORY_CARE_PROVIDER_SITE_OTHER): Payer: Medicare Other | Admitting: Internal Medicine

## 2014-02-21 ENCOUNTER — Encounter: Payer: Self-pay | Admitting: Internal Medicine

## 2014-02-21 ENCOUNTER — Telehealth: Payer: Self-pay | Admitting: Internal Medicine

## 2014-02-21 VITALS — BP 142/86 | HR 77 | Temp 97.9°F | Ht 69.0 in | Wt 224.6 lb

## 2014-02-21 DIAGNOSIS — F17200 Nicotine dependence, unspecified, uncomplicated: Secondary | ICD-10-CM

## 2014-02-21 DIAGNOSIS — R0789 Other chest pain: Secondary | ICD-10-CM

## 2014-02-21 MED ORDER — HYDROCODONE-ACETAMINOPHEN 5-325 MG PO TABS: 1.0000 | ORAL_TABLET | ORAL | Status: DC | PRN

## 2014-02-21 MED ORDER — HYDROCODONE-ACETAMINOPHEN 5-300 MG PO TABS: ORAL_TABLET | ORAL | Status: DC

## 2014-02-21 NOTE — Telephone Encounter (Signed)
Yes of course

## 2014-02-21 NOTE — Telephone Encounter (Signed)
Called spoke with Tony Doyle. She reports they do not have vicodin 5-300 but they do have the 5-325 mg. Can they use this instead? Please advise thanks

## 2014-02-21 NOTE — Telephone Encounter (Signed)
Tony Doyle w/ [facility] is calling back.  Tony Doyle

## 2014-02-21 NOTE — Telephone Encounter (Signed)
Called spoke with Tammy. Advised her this was fine. Nothing further needed

## 2014-02-21 NOTE — Progress Notes (Signed)
Subjective:     Patient ID: Tony Doyle, male   DOB: 07-Mar-1971     MRN: [id number]  HPI  39 yowm smoker on disability since 1995 for L leg injury with no resp problem until  bad bronchitis in Nov 2014 treated in er with abx/pred/saba and 100% recovered and didn't need to use saba then acute cough/L CP with green mucus around 02/10/14 saw [facility] rx cefnir then to ER 02/19/14 for L CP rx with advil 800 mg tid but much worse am 02/21/2014 > referred for pulmonary eval  02/21/2014 1st [facility] office visit/ Tony Doyle cc severe positional > pleuritic L CP in setting of uri with green mucus turning clearer on omnicef to date. Worst when tries to sit up from a sitting position Not limited by breathing from desired activities .  No obvious day to day or daytime variabilty or assoc  chest tightness, subjective wheeze overt sinus or hb symptoms. No unusual exp hx or h/o childhood pna/ asthma or knowledge of premature birth.  Sleeping ok without nocturnal  or early am exacerbation  of respiratory  c/o's or need for noct saba. Also denies any obvious fluctuation of symptoms with weather or environmental changes or other aggravating or alleviating factors except as outlined above   Current Medications, Allergies, Complete Past Medical History, Past Surgical History, Family History, and Social History were reviewed in Tony Doyle record.  ROS  The following are not active complaints unless bolded sore throat, dysphagia, dental problems, itching, sneezing,  nasal congestion or excess/ purulent secretions, ear ache,   fever, chills, sweats, unintended wt loss, pleuritic or exertional cp, hemoptysis,  orthopnea pnd or leg swelling, presyncope, palpitations, heartburn, abdominal pain, anorexia, nausea, vomiting, diarrhea  or change in bowel or urinary habits, change in stools or urine, dysuria,hematuria,  rash, arthralgias, visual complaints, headache, numbness weakness or ataxia or  problems with walking or coordination,  change in mood/affect or memory.            Review of Systems     Objective:   Physical Exam  amb wm nad clutching L flank below rib cage   Wt Readings from Last 3 Encounters:  02/21/14 224 lb 9.6 oz (101.878 kg)  02/19/14 227 lb 2 oz (103.023 kg)  10/29/13 220 lb (99.791 kg)      HEENT: nl dentition, turbinates, and orophanx. Nl external ear canals without cough reflex   NECK :  without JVD/Nodes/TM/ nl carotid upstrokes bilaterally   LUNGS: no acc muscle use, clear to A and P bilaterally without cough on insp or exp maneuvers   CV:  RRR  no s3 or murmur or increase in P2, no edema   ABD:  soft and nontender with nl excursion in the supine position. No bruits or organomegaly, bowel sounds nl  MS:  warm without deformities, calf tenderness, cyanosis or clubbing  SKIN: warm and dry without lesions    NEURO:  alert, approp, no deficits    cxr 02/19/14 1. Increased interstitial markings both lung bases suggesting  pneumonitis.  2. Underlying chronic interstitial lung disease.     Assessment:

## 2014-02-21 NOTE — Patient Instructions (Addendum)
Vicodin 5 one every 4 hours as needed for cough or pain   The key is to stop smoking completely before smoking completely stops you!   Please schedule a follow up office visit in 4 weeks, sooner if needed with pfts

## 2014-02-22 DIAGNOSIS — F17200 Nicotine dependence, unspecified, uncomplicated: Secondary | ICD-10-CM | POA: Insufficient documentation

## 2014-02-22 DIAGNOSIS — R0789 Other chest pain: Secondary | ICD-10-CM | POA: Insufficient documentation

## 2014-02-22 NOTE — Assessment & Plan Note (Addendum)
Pain more positional than pleuritic but probably brought on by coughing initially secondary to bronchitis which has already been treated appropriately by Dr Megan Mans   Will try to control cough with vicodin short term and f/u with pfts

## 2014-02-22 NOTE — Assessment & Plan Note (Signed)

## 2014-03-02 DIAGNOSIS — F17200 Nicotine dependence, unspecified, uncomplicated: Secondary | ICD-10-CM | POA: Insufficient documentation

## 2014-03-02 DIAGNOSIS — Z79899 Other long term (current) drug therapy: Secondary | ICD-10-CM | POA: Insufficient documentation

## 2014-03-02 DIAGNOSIS — R071 Chest pain on breathing: Secondary | ICD-10-CM | POA: Insufficient documentation

## 2014-03-03 ENCOUNTER — Encounter (HOSPITAL_COMMUNITY): Payer: Self-pay | Admitting: Emergency Medicine

## 2014-03-03 ENCOUNTER — Emergency Department (HOSPITAL_COMMUNITY)
Admission: EM | Admit: 2014-03-03 | Discharge: 2014-03-03 | Disposition: A | Discharge status: Home / Self Care | Payer: Medicare Other | Attending: Emergency Medicine | Admitting: Emergency Medicine

## 2014-03-03 DIAGNOSIS — R0781 Pleurodynia: Secondary | ICD-10-CM

## 2014-03-03 MED ORDER — NAPROXEN 500 MG PO TABS: 500.0000 mg | ORAL_TABLET | Freq: Two times a day (BID) | ORAL | Status: DC

## 2014-03-03 NOTE — Discharge Instructions (Signed)
If your rash becomes blistery, see your PCP or return to the ED. You can take the Hydrocodone-Acetaminophen prescribed by Dr. Sherene SiresWert for more severe pain.  Pleurisy Pleurisy is an inflammation and swelling of the lining of the lungs (pleura). Because of this inflammation, it hurts to breathe. It can be aggravated by coughing, laughing, or deep breathing. Pleurisy is often caused by an underlying infection or disease.  HOME CARE INSTRUCTIONS  Monitor your pleurisy for any changes. The following actions may help to alleviate any discomfort you are experiencing:  Medicine may help with pain. Only take over-the-counter or prescription medicines for pain, discomfort, or fever as directed by your health care provider.  Only take antibiotic medicine as directed. Make sure to finish it even if you start to feel better. SEEK MEDICAL CARE IF:   Your pain is not controlled with medicine or is increasing.  You have an increase in pus-like (purulent) secretions brought up with coughing. SEEK IMMEDIATE MEDICAL CARE IF:   You have blue or dark lips, fingernails, or toenails.  You are coughing up blood.  You have increased difficulty breathing.  You have continuing pain unrelieved by medicine or pain lasting more than 1 week.  You have pain that radiates into your neck, arms, or jaw.  You develop increased shortness of breath or wheezing.  You develop a fever, rash, vomiting, fainting, or other serious symptoms. MAKE SURE YOU:  Understand these instructions.   Will watch your condition.   Will get help right away if you are not doing well or get worse.  Document Released: 11/24/2005 Document Revised: 07/27/2013 Document Reviewed: 05/08/2013 [facility] Patient Information 2014 [employer], [place].  Naproxen and naproxen sodium oral immediate-release tablets What is this medicine? NAPROXEN (na PROX en) is a non-steroidal anti-inflammatory drug (NSAID). It is used to reduce swelling and to treat  pain. This medicine may be used for dental pain, headache, or painful monthly periods. It is also used for painful joint and muscular problems such as arthritis, tendinitis, bursitis, and gout. This medicine may be used for other purposes; ask your health care provider or pharmacist if you have questions. COMMON BRAND NAME(S): Aflaxen, Aleve Arthritis, Aleve, All Day Relief, Anaprox DS, Anaprox, Naprosyn What should I tell my health care provider before I take this medicine? They need to know if you have any of these conditions: -asthma -cigarette smoker -drink more than 3 alcohol containing drinks a day -heart disease or circulation problems such as heart failure or leg edema (fluid retention) -high blood pressure -kidney disease -liver disease -stomach bleeding or ulcers -an unusual or allergic reaction to naproxen, aspirin, other NSAIDs, other medicines, foods, dyes, or preservatives -pregnant or trying to get pregnant -breast-feeding How should I use this medicine? Take this medicine by mouth with a glass of water. Follow the directions on the prescription label. Take it with food if your stomach gets upset. Try to not lie down for at least 10 minutes after you take it. Take your medicine at regular intervals. Do not take your medicine more often than directed. Long-term, continuous use may increase the risk of heart attack or stroke. A special MedGuide will be given to you by the pharmacist with each prescription and refill. Be sure to read this information carefully each time. Talk to your pediatrician regarding the use of this medicine in children. Special care may be needed. Overdosage: If you think you have taken too much of this medicine contact a poison control center or emergency room  at once. NOTE: This medicine is only for you. Do not share this medicine with others. What if I miss a dose? If you miss a dose, take it as soon as you can. If it is almost time for your next dose,  take only that dose. Do not take double or extra doses. What may interact with this medicine? -alcohol -aspirin -cidofovir -diuretics -lithium -methotrexate -other drugs for inflammation like ketorolac or prednisone -pemetrexed -probenecid -warfarin This list may not describe all possible interactions. Give your health care provider a list of all the medicines, herbs, non-prescription drugs, or dietary supplements you use. Also tell them if you smoke, drink alcohol, or use illegal drugs. Some items may interact with your medicine. What should I watch for while using this medicine? Tell your doctor or health care professional if your pain does not get better. Talk to your doctor before taking another medicine for pain. Do not treat yourself. This medicine does not prevent heart attack or stroke. In fact, this medicine may increase the chance of a heart attack or stroke. The chance may increase with longer use of this medicine and in people who have heart disease. If you take aspirin to prevent heart attack or stroke, talk with your doctor or health care professional. Do not take other medicines that contain aspirin, ibuprofen, or naproxen with this medicine. Side effects such as stomach upset, nausea, or ulcers may be more likely to occur. Many medicines available without a prescription should not be taken with this medicine. This medicine can cause ulcers and bleeding in the stomach and intestines at any time during treatment. Do not smoke cigarettes or drink alcohol. These increase irritation to your stomach and can make it more susceptible to damage from this medicine. Ulcers and bleeding can happen without warning symptoms and can cause death. You may get drowsy or dizzy. Do not drive, use machinery, or do anything that needs mental alertness until you know how this medicine affects you. Do not stand or sit up quickly, especially if you are an older patient. This reduces the risk of dizzy or  fainting spells. This medicine can cause you to bleed more easily. Try to avoid damage to your teeth and gums when you brush or floss your teeth. What side effects may I notice from receiving this medicine? Side effects that you should report to your doctor or health care professional as soon as possible: -black or bloody stools, blood in the urine or vomit -blurred vision -chest pain -difficulty breathing or wheezing -nausea or vomiting -severe stomach pain -skin rash, skin redness, blistering or peeling skin, hives, or itching -slurred speech or weakness on one side of the body -swelling of eyelids, throat, lips -unexplained weight gain or swelling -unusually weak or tired -yellowing of eyes or skin Side effects that usually do not require medical attention (report to your doctor or health care professional if they continue or are bothersome): -constipation -headache -heartburn This list may not describe all possible side effects. Call your doctor for medical advice about side effects. You may report side effects to FDA at 1-800-FDA-1088. Where should I keep my medicine? Keep out of the reach of children. Store at room temperature between 15 and 30 degrees C (59 and 86 degrees F). Keep container tightly closed. Throw away any unused medicine after the expiration date. NOTE: This sheet is a summary. It may not cover all possible information. If you have questions about this medicine, talk to your doctor, pharmacist, or  health care provider.  2014, Elsevier/Gold Standard. (2009-11-26 20:10:16)

## 2014-03-03 NOTE — ED Notes (Signed)
Seen here 3/15 for rib pain, now has painful burning rash on left side epigastric area with pain radiating around left torso.

## 2014-03-03 NOTE — ED Provider Notes (Addendum)
CSN: [id number]     Arrival date & time 03/02/14  2341 History   First MD Initiated Contact with Patient 03/03/14 0101     Chief Complaint  Patient presents with  . Rash     (Consider location/radiation/quality/duration/timing/severity/associated sxs/prior Treatment) Patient is a 43 y.o. male presenting with rash. The history is provided by the patient.  Rash He has been having pain across his lower rib cage and upper abdomen for about the last 2 weeks. He was seen by his PCP and then seen in the ED on March 15. He states that the pain was initially on his right side and is now on his left side. His main complain is a sharp pain when he coughs and an adult, burning pain residual. He rates the pain at 7/10 although it goes up to 10/10 with a cough. He denies dyspnea or fever. He's noted a rash developing tonight. He states that he had been taking ibuprofen 800 mg which had been giving him relief but he has run out. He denies fever or chills. Cough is nonproductive. He is and a cigarette smoker.  History reviewed. No pertinent past medical history. Past Surgical History  Procedure Laterality Date  . Arm surgery      LEFT  . Leg surgery      LEFT   Family History  Problem Relation Age of Onset  . Asthma Father   . Asthma Mother   . Emphysema Mother     smoked  . Emphysema Father     smoked   History  Substance Use Topics  . Smoking status: Current Every Day Smoker -- 0.50 packs/day for 27 years    Types: Cigarettes  . Smokeless tobacco: Never Used  . Alcohol Use: No    Review of Systems  Skin: Positive for rash.  All other systems reviewed and are negative.      Allergies  Review of patient's allergies indicates no known allergies.  Home Medications   Current Outpatient Rx  Name  Route  Sig  Dispense  Refill  . cefdinir (OMNICEF) 300 MG capsule   Oral   Take 300 mg by mouth daily.         Marland Kitchen. HYDROcodone-acetaminophen (NORCO) 5-325 MG per tablet   Oral   Take  1 tablet by mouth every 4 (four) hours as needed for moderate pain.   40 tablet   0   . naproxen (NAPROSYN) 500 MG tablet   Oral   Take 1 tablet (500 mg total) by mouth 2 (two) times daily.   30 tablet   0    BP 123/84  Pulse 70  Temp(Src) 98.3 F (36.8 C) (Oral)  Resp 16  Ht 5\' 9"  (1.753 m)  Wt 224 lb (101.606 kg)  BMI 33.06 kg/m2  SpO2 96% Physical Exam  Nursing note and vitals reviewed.  43 year old male, resting comfortably and in no acute distress. Vital signs are normal. Oxygen saturation is 96%, which is normal. Head is normocephalic and atraumatic. PERRLA, EOMI. Oropharynx is clear. Neck is nontender and supple without adenopathy or JVD. Back is nontender and there is no CVA tenderness. Lungs are clear without rales, wheezes, or rhonchi. Chest is nontender. Heart has regular rate and rhythm without murmur. Abdomen is soft, flat, nontender without masses or hepatosplenomegaly and peristalsis is normoactive. Extremities have no cyanosis or edema, full range of motion is present. Skin is warm and dry. There is a very faint rash present in the  left flank area but is nonspecific. It is slightly erythematous but without any macules or papules or vesicles. This area is nontender. Neurologic: Mental status is normal, cranial nerves are intact, there are no motor or sensory deficits.  ED Course  Procedures (including critical care time) Labs Review  MDM   Final diagnoses:  Pleuritic chest pain    Ongoing pleuritic chest pain. Old records are reviewed and in addition to ED visit on March 15, he was seen in the pulmonary clinic on March 17 and given a prescription for hydrocodone acetaminophen. Prescription was written for 40 tablets. I asked the patient he had been taken any of these and he states that the pharmacy had not filled the prescription for him. I reviewed his record on [place] controlled substance reporting website and found that the prescription had  indeed been filled and advised him of this. I have advised him that he should take the hydrocodone-acetaminophen for more severe pain in his given a prescription for naproxen. Although there is a rash present over this area, it is not typical of herpes zoster. He is advised to keep an eye on the rash and she did develop into the vesicles, that he should seek medical attention at that point to be started on antiviral treatment.    Dione Booze, MD 03/03/14 [id number]  Dione Booze, MD 03/03/14 [phone]

## 2014-03-28 ENCOUNTER — Ambulatory Visit: Payer: Medicare Other | Admitting: Internal Medicine

## 2014-07-10 ENCOUNTER — Other Ambulatory Visit (HOSPITAL_COMMUNITY): Payer: Self-pay | Admitting: Family Medicine

## 2014-07-10 ENCOUNTER — Ambulatory Visit (HOSPITAL_COMMUNITY)
Admission: RE | Admit: 2014-07-10 | Discharge: 2014-07-10 | Disposition: A | Discharge status: Home / Self Care | Payer: Medicare Other | Source: Ambulatory Visit | Attending: Family Medicine | Admitting: Family Medicine

## 2014-07-10 DIAGNOSIS — R0602 Shortness of breath: Secondary | ICD-10-CM | POA: Diagnosis not present

## 2014-07-10 DIAGNOSIS — F17210 Nicotine dependence, cigarettes, uncomplicated: Secondary | ICD-10-CM

## 2014-07-10 DIAGNOSIS — F17200 Nicotine dependence, unspecified, uncomplicated: Secondary | ICD-10-CM | POA: Diagnosis not present

## 2015-04-26 ENCOUNTER — Other Ambulatory Visit (HOSPITAL_COMMUNITY): Payer: Self-pay | Admitting: Family Medicine

## 2015-04-26 ENCOUNTER — Ambulatory Visit (HOSPITAL_COMMUNITY)
Admission: RE | Admit: 2015-04-26 | Discharge: 2015-04-26 | Disposition: A | Discharge status: Home / Self Care | Payer: Medicare Other | Source: Ambulatory Visit | Attending: Family Medicine | Admitting: Family Medicine

## 2015-04-26 DIAGNOSIS — M25552 Pain in left hip: Secondary | ICD-10-CM | POA: Insufficient documentation

## 2015-04-26 DIAGNOSIS — M79652 Pain in left thigh: Secondary | ICD-10-CM | POA: Diagnosis not present

## 2015-04-26 DIAGNOSIS — R52 Pain, unspecified: Secondary | ICD-10-CM

## 2015-05-03 ENCOUNTER — Ambulatory Visit (HOSPITAL_COMMUNITY)
Admission: RE | Admit: 2015-05-03 | Discharge: 2015-05-03 | Disposition: A | Discharge status: Home / Self Care | Payer: Medicare Other | Source: Ambulatory Visit | Attending: Family Medicine | Admitting: Family Medicine

## 2015-05-03 ENCOUNTER — Other Ambulatory Visit (HOSPITAL_COMMUNITY): Payer: Self-pay | Admitting: Family Medicine

## 2015-05-03 DIAGNOSIS — R05 Cough: Secondary | ICD-10-CM | POA: Insufficient documentation

## 2015-05-03 DIAGNOSIS — R058 Other specified cough: Secondary | ICD-10-CM

## 2015-05-15 ENCOUNTER — Other Ambulatory Visit (HOSPITAL_COMMUNITY): Payer: Self-pay | Admitting: Family Medicine

## 2015-05-15 ENCOUNTER — Ambulatory Visit (HOSPITAL_COMMUNITY)
Admission: RE | Admit: 2015-05-15 | Discharge: 2015-05-15 | Disposition: A | Discharge status: Home / Self Care | Payer: Medicare Other | Source: Ambulatory Visit | Attending: Family Medicine | Admitting: Family Medicine

## 2015-05-15 DIAGNOSIS — J189 Pneumonia, unspecified organism: Secondary | ICD-10-CM | POA: Diagnosis present

## 2015-05-15 DIAGNOSIS — R918 Other nonspecific abnormal finding of lung field: Secondary | ICD-10-CM | POA: Diagnosis not present

## 2015-08-31 ENCOUNTER — Ambulatory Visit (HOSPITAL_COMMUNITY)
Admission: RE | Admit: 2015-08-31 | Discharge: 2015-08-31 | Disposition: A | Discharge status: Home / Self Care | Payer: Medicare Other | Source: Ambulatory Visit | Attending: Family Medicine | Admitting: Family Medicine

## 2015-08-31 ENCOUNTER — Other Ambulatory Visit (HOSPITAL_COMMUNITY): Payer: Self-pay | Admitting: Family Medicine

## 2015-08-31 DIAGNOSIS — R509 Fever, unspecified: Secondary | ICD-10-CM

## 2015-08-31 DIAGNOSIS — R05 Cough: Secondary | ICD-10-CM | POA: Insufficient documentation

## 2015-08-31 DIAGNOSIS — R053 Chronic cough: Secondary | ICD-10-CM

## 2015-09-01 ENCOUNTER — Encounter (HOSPITAL_COMMUNITY): Payer: Self-pay | Admitting: *Deleted

## 2015-09-01 ENCOUNTER — Emergency Department (HOSPITAL_COMMUNITY)
Admission: EM | Admit: 2015-09-01 | Discharge: 2015-09-01 | Disposition: A | Discharge status: Home / Self Care | Payer: Medicare Other | Attending: Emergency Medicine | Admitting: Emergency Medicine

## 2015-09-01 ENCOUNTER — Emergency Department (HOSPITAL_COMMUNITY): Payer: Medicare Other

## 2015-09-01 DIAGNOSIS — Z8701 Personal history of pneumonia (recurrent): Secondary | ICD-10-CM | POA: Diagnosis not present

## 2015-09-01 DIAGNOSIS — Z79899 Other long term (current) drug therapy: Secondary | ICD-10-CM | POA: Insufficient documentation

## 2015-09-01 DIAGNOSIS — J209 Acute bronchitis, unspecified: Secondary | ICD-10-CM | POA: Diagnosis not present

## 2015-09-01 DIAGNOSIS — Z8639 Personal history of other endocrine, nutritional and metabolic disease: Secondary | ICD-10-CM | POA: Diagnosis not present

## 2015-09-01 DIAGNOSIS — Z791 Long term (current) use of non-steroidal anti-inflammatories (NSAID): Secondary | ICD-10-CM | POA: Insufficient documentation

## 2015-09-01 DIAGNOSIS — Z720 Tobacco use: Secondary | ICD-10-CM | POA: Diagnosis not present

## 2015-09-01 DIAGNOSIS — R05 Cough: Secondary | ICD-10-CM | POA: Diagnosis present

## 2015-09-01 HISTORY — DX: Pneumonia, unspecified organism: J18.9

## 2015-09-01 HISTORY — DX: Pure hypercholesterolemia, unspecified: E78.00

## 2015-09-01 MED ORDER — BENZONATATE 100 MG PO CAPS
100.0000 mg | ORAL_CAPSULE | Freq: Three times a day (TID) | ORAL | Status: DC
Start: 2015-09-01 — End: 2016-10-27

## 2015-09-01 MED ORDER — AZITHROMYCIN 250 MG PO TABS
250.0000 mg | ORAL_TABLET | Freq: Every day | ORAL | Status: DC
Start: 2015-09-01 — End: 2016-02-08

## 2015-09-01 NOTE — Discharge Instructions (Signed)

## 2015-09-01 NOTE — ED Notes (Signed)
Pt states Dr. Lorenso Courier office called him on Friday stating he had pneumonia. Pt has not been started on antibiotics. Pt states the nurse called and told him but the physician had left the office for the day.

## 2015-09-01 NOTE — ED Provider Notes (Signed)
CSN: [id number]     Arrival date & time 09/01/15  1835 History   First MD Initiated Contact with Patient 09/01/15 1847     Chief Complaint  Patient presents with  . Cough     (Consider location/radiation/quality/duration/timing/severity/associated sxs/prior Treatment) Patient is a 44 y.o. male presenting with cough. The history is provided by the patient.  Cough Cough characteristics:  Non-productive Severity:  Moderate Onset quality:  Gradual Duration:  1 week Timing:  Constant Progression:  Worsening Chronicity:  Recurrent Smoker: yes   Context: smoke exposure   Relieved by:  Nothing Worsened by:  Nothing tried Ineffective treatments:  None tried Associated symptoms: wheezing   Associated symptoms: no fever and no shortness of breath     Past Medical History  Diagnosis Date  . Pneumonia   . Hypercholesterolemia    Past Surgical History  Procedure Laterality Date  . Arm surgery      LEFT  . Leg surgery      LEFT   Family History  Problem Relation Age of Onset  . Asthma Father   . Asthma Mother   . Emphysema Mother     smoked  . Emphysema Father     smoked   Social History  Substance Use Topics  . Smoking status: Current Every Day Smoker -- 0.50 packs/day for 27 years    Types: Cigarettes  . Smokeless tobacco: Never Used  . Alcohol Use: No    Review of Systems  Constitutional: Negative for fever.  Respiratory: Positive for cough and wheezing. Negative for shortness of breath.   All other systems reviewed and are negative.     Allergies  Review of patient's allergies indicates no known allergies.  Home Medications   Prior to Admission medications   Medication Sig Start Date End Date Taking? Authorizing Jamila Slatten  azithromycin (ZITHROMAX) 250 MG tablet Take 1 tablet (250 mg total) by mouth daily. Take first 2 tablets together, then 1 every day until finished. 09/01/15   Lyndal Pulley, MD  benzonatate (TESSALON) 100 MG capsule Take 1 capsule (100 mg  total) by mouth every 8 (eight) hours. 09/01/15   Lyndal Pulley, MD  cefdinir (OMNICEF) 300 MG capsule Take 300 mg by mouth daily. 02/16/14   Historical Shrita Thien, MD  HYDROcodone-acetaminophen (NORCO) 5-325 MG per tablet Take 1 tablet by mouth every 4 (four) hours as needed for moderate pain. 02/21/14   Nyoka Cowden, MD  naproxen (NAPROSYN) 500 MG tablet Take 1 tablet (500 mg total) by mouth 2 (two) times daily. 03/03/14   Dione Booze, MD   BP 139/74 mmHg  Pulse 92  Temp(Src) 97.7 F (36.5 C) (Oral)  Resp 18  Ht  (1.727 m)  Wt 224 lb (101.606 kg)  BMI 34.07 kg/m2  SpO2 92% Physical Exam  Constitutional: He is oriented to person, place, and time. He appears well-developed and well-nourished. No distress.  HENT:  Head: Normocephalic and atraumatic.  Eyes: Conjunctivae are normal.  Neck: Neck supple. No tracheal deviation present.  Cardiovascular: Normal rate, regular rhythm and normal heart sounds.   Pulmonary/Chest: Effort normal. No respiratory distress. He has wheezes (diffuse end espiratory on forsed exhalation). He has no rhonchi. He has no rales.  Abdominal: Soft. He exhibits no distension.  Neurological: He is alert and oriented to person, place, and time.  Skin: Skin is warm and dry.  Psychiatric: He has a normal mood and affect.    ED Course  Procedures (including critical care time) Labs Review Labs  Reviewed - No data to display  Imaging Review Dg Chest 2 View  08/31/2015   CLINICAL DATA:  Productive cough.  EXAM: CHEST  2 VIEW  COMPARISON:  05/15/2015.  07/10/2014 .  FINDINGS: Mediastinum hilar structures normal. No focal infiltrate. Stable mild interstitial prominence noted bilaterally most likely related to chronic interstitial lung disease. Active pneumonitis cannot be excluded. Heart size normal. No pleural effusion or pneumothorax. No acute bony abnormality .  IMPRESSION: Persistent mild bilateral interstitial prominence, a component which is most likely chronic.  Active interstitial pneumonitis cannot be completely excluded . No focal alveolar infiltrate noted.   Electronically Signed   By: Maisie Fus  Register   On: 08/31/2015 11:04   I have personally reviewed and evaluated these images and lab results as part of my medical decision-making.   EKG Interpretation None      MDM   Final diagnoses:  Acute bronchitis, unspecified organism    44 year old male presents with low-grade temperature, cough, mild shortness of breath. He was told he had possible pneumonia during a recent outpatient visit and is presenting to ask about possible need for therapy. Patient well-appearing, breathing appropriately, oxygen saturation is normal given his smoking status, has mild end expiratory wheezing but is taking in normal tidal volumes.  Recent x-ray from yesterday demonstrates some interstitial prominence concerning for acute bronchitis given his clinical picture. He will be placed on azithromycin to cover atypicals and can follow-up with his primary care physician after supportive care. Patient advised on smoking cessation as the only effective remedy for this condition.  Lyndal Pulley, MD 09/01/15 [phone]

## 2015-09-01 NOTE — ED Notes (Signed)
Discharge instructions given, pt demonstrated teach back and verbal understanding. No concerns voiced.  

## 2016-02-06 ENCOUNTER — Other Ambulatory Visit (HOSPITAL_COMMUNITY): Payer: Self-pay | Admitting: Internal Medicine

## 2016-02-06 ENCOUNTER — Ambulatory Visit (HOSPITAL_COMMUNITY)
Admission: RE | Admit: 2016-02-06 | Discharge: 2016-02-06 | Disposition: A | Discharge status: Home / Self Care | Payer: Medicare Other | Source: Ambulatory Visit | Attending: Internal Medicine | Admitting: Internal Medicine

## 2016-02-06 DIAGNOSIS — R05 Cough: Secondary | ICD-10-CM | POA: Insufficient documentation

## 2016-02-06 DIAGNOSIS — R059 Cough, unspecified: Secondary | ICD-10-CM

## 2016-02-08 ENCOUNTER — Encounter (HOSPITAL_COMMUNITY): Payer: Self-pay | Admitting: Emergency Medicine

## 2016-02-08 ENCOUNTER — Emergency Department (HOSPITAL_COMMUNITY)
Admission: EM | Admit: 2016-02-08 | Discharge: 2016-02-08 | Disposition: A | Discharge status: Home / Self Care | Payer: Medicare Other | Attending: Emergency Medicine | Admitting: Emergency Medicine

## 2016-02-08 ENCOUNTER — Emergency Department (HOSPITAL_COMMUNITY): Payer: Medicare Other

## 2016-02-08 DIAGNOSIS — F17210 Nicotine dependence, cigarettes, uncomplicated: Secondary | ICD-10-CM | POA: Insufficient documentation

## 2016-02-08 DIAGNOSIS — Z8639 Personal history of other endocrine, nutritional and metabolic disease: Secondary | ICD-10-CM | POA: Diagnosis not present

## 2016-02-08 DIAGNOSIS — Z791 Long term (current) use of non-steroidal anti-inflammatories (NSAID): Secondary | ICD-10-CM | POA: Diagnosis not present

## 2016-02-08 DIAGNOSIS — Z8701 Personal history of pneumonia (recurrent): Secondary | ICD-10-CM | POA: Insufficient documentation

## 2016-02-08 DIAGNOSIS — J40 Bronchitis, not specified as acute or chronic: Secondary | ICD-10-CM | POA: Diagnosis not present

## 2016-02-08 DIAGNOSIS — R197 Diarrhea, unspecified: Secondary | ICD-10-CM | POA: Insufficient documentation

## 2016-02-08 DIAGNOSIS — Z792 Long term (current) use of antibiotics: Secondary | ICD-10-CM | POA: Insufficient documentation

## 2016-02-08 DIAGNOSIS — R05 Cough: Secondary | ICD-10-CM | POA: Diagnosis present

## 2016-02-08 LAB — CBC WITH DIFFERENTIAL/PLATELET
BASOS PCT: 0 %
Basophils Absolute: 0 10*3/uL (ref 0.0–0.1)
Eosinophils Absolute: 0.1 10*3/uL (ref 0.0–0.7)
Eosinophils Relative: 2 %
HEMATOCRIT: 49.2 % (ref 39.0–52.0)
HEMOGLOBIN: 17.4 g/dL — AB (ref 13.0–17.0)
Lymphocytes Relative: 41 %
Lymphs Abs: 3 10*3/uL (ref 0.7–4.0)
MCH: 29.9 pg (ref 26.0–34.0)
MCHC: 35.4 g/dL (ref 30.0–36.0)
MCV: 84.5 fL (ref 78.0–100.0)
MONOS PCT: 6 %
Monocytes Absolute: 0.4 10*3/uL (ref 0.1–1.0)
NEUTROS ABS: 3.8 10*3/uL (ref 1.7–7.7)
NEUTROS PCT: 51 %
Platelets: 165 10*3/uL (ref 150–400)
RBC: 5.82 MIL/uL — ABNORMAL HIGH (ref 4.22–5.81)
RDW: 12.9 % (ref 11.5–15.5)
WBC: 7.5 10*3/uL (ref 4.0–10.5)

## 2016-02-08 LAB — BRAIN NATRIURETIC PEPTIDE: B Natriuretic Peptide: 12 pg/mL (ref 0.0–100.0)

## 2016-02-08 LAB — COMPREHENSIVE METABOLIC PANEL
ALBUMIN: 4.1 g/dL (ref 3.5–5.0)
ALK PHOS: 77 U/L (ref 38–126)
ALT: 59 U/L (ref 17–63)
ANION GAP: 7 (ref 5–15)
AST: 73 U/L — ABNORMAL HIGH (ref 15–41)
BUN: 9 mg/dL (ref 6–20)
CHLORIDE: 99 mmol/L — AB (ref 101–111)
CO2: 30 mmol/L (ref 22–32)
Calcium: 9.4 mg/dL (ref 8.9–10.3)
Creatinine, Ser: 0.84 mg/dL (ref 0.61–1.24)
GFR calc Af Amer: >60 mL/min (ref >60)
GFR calc non Af Amer: >60 mL/min (ref >60)
GLUCOSE: 95 mg/dL (ref 65–99)
Potassium: 3.9 mmol/L (ref 3.5–5.1)
SODIUM: 136 mmol/L (ref 135–145)
TOTAL PROTEIN: 8.2 g/dL — AB (ref 6.5–8.1)
Total Bilirubin: 0.6 mg/dL (ref 0.3–1.2)

## 2016-02-08 LAB — LIPASE, BLOOD: Lipase: 34 U/L (ref 11–51)

## 2016-02-08 MED ORDER — SODIUM CHLORIDE 0.9 % IV BOLUS (SEPSIS)
1000.0000 mL | Freq: Once | INTRAVENOUS | Status: AC
  Administered 2016-02-08: 1000 mL via INTRAVENOUS

## 2016-02-08 MED ORDER — ALBUTEROL SULFATE HFA 108 (90 BASE) MCG/ACT IN AERS
2.0000 | INHALATION_SPRAY | Freq: Four times a day (QID) | RESPIRATORY_TRACT | Status: DC
  Administered 2016-02-08: 2 via RESPIRATORY_TRACT
  Filled 2016-02-08 (×2): qty 6.7

## 2016-02-08 MED ORDER — ALBUTEROL SULFATE (2.5 MG/3ML) 0.083% IN NEBU
2.5000 mg | INHALATION_SOLUTION | Freq: Once | RESPIRATORY_TRACT | Status: AC
  Administered 2016-02-08: 2.5 mg via RESPIRATORY_TRACT

## 2016-02-08 MED ORDER — PREDNISONE 20 MG PO TABS: 40.0000 mg | ORAL_TABLET | Freq: Every day | ORAL | Status: DC

## 2016-02-08 MED ORDER — METHYLPREDNISOLONE SODIUM SUCC 125 MG IJ SOLR
125.0000 mg | Freq: Once | INTRAMUSCULAR | Status: AC
  Administered 2016-02-08: 125 mg via INTRAVENOUS
  Filled 2016-02-08: qty 2

## 2016-02-08 MED ORDER — ALBUTEROL SULFATE (2.5 MG/3ML) 0.083% IN NEBU
INHALATION_SOLUTION | RESPIRATORY_TRACT | Status: AC
  Filled 2016-02-08: qty 3

## 2016-02-08 MED ORDER — IPRATROPIUM-ALBUTEROL 0.5-2.5 (3) MG/3ML IN SOLN
RESPIRATORY_TRACT | Status: AC
  Filled 2016-02-08: qty 3

## 2016-02-08 MED ORDER — IPRATROPIUM-ALBUTEROL 0.5-2.5 (3) MG/3ML IN SOLN
3.0000 mL | Freq: Once | RESPIRATORY_TRACT | Status: AC
  Administered 2016-02-08: 3 mL via RESPIRATORY_TRACT

## 2016-02-08 NOTE — ED Notes (Addendum)
Pt reports productive cough with green sputum x 2 weeks. Pt states he saw PCP and received a chest xray, but has not heard any results. States cough is only getting worse. Pt also reports general malaise and fever/chills.

## 2016-02-08 NOTE — Discharge Instructions (Signed)
How to Use an Inhaler °Proper inhaler technique is very important. Good technique ensures that the medicine reaches the lungs. Poor technique results in depositing the medicine on the tongue and back of the throat rather than in the airways. If you do not use the inhaler with good technique, the medicine will not help you. °STEPS TO FOLLOW IF USING AN INHALER WITHOUT AN EXTENSION TUBE °1. Remove the cap from the inhaler. °2. If you are using the inhaler for the first time, you will need to prime it. Shake the inhaler for 5 seconds and release four puffs into the air, away from your face. Ask your health care provider or pharmacist if you have questions about priming your inhaler. °3. Shake the inhaler for 5 seconds before each breath in (inhalation). °4. Position the inhaler so that the top of the canister faces up. °5. Put your index finger on the top of the medicine canister. Your thumb supports the bottom of the inhaler. °6. Open your mouth. °7. Either place the inhaler between your teeth and place your lips tightly around the mouthpiece, or hold the inhaler 1-2 inches away from your open mouth. If you are unsure of which technique to use, ask your health care provider. °8. Breathe out (exhale) normally and as completely as possible. °9. Press the canister down with your index finger to release the medicine. °10. At the same time as the canister is pressed, inhale deeply and slowly until your lungs are completely filled. This should take 4-6 seconds. Keep your tongue down. °11. Hold the medicine in your lungs for 5-10 seconds (10 seconds is best). This helps the medicine get into the small airways of your lungs. °12. Breathe out slowly, through pursed lips. Whistling is an example of pursed lips. °13. Wait at least 15-30 seconds between puffs. Continue with the above steps until you have taken the number of puffs your health care provider has ordered. Do not use the inhaler more than your health care provider  tells you. °14. Replace the cap on the inhaler. °15. Follow the directions from your health care provider or the inhaler insert for cleaning the inhaler. °STEPS TO FOLLOW IF USING AN INHALER WITH AN EXTENSION (SPACER) °1. Remove the cap from the inhaler. °2. If you are using the inhaler for the first time, you will need to prime it. Shake the inhaler for 5 seconds and release four puffs into the air, away from your face. Ask your health care provider or pharmacist if you have questions about priming your inhaler. °3. Shake the inhaler for 5 seconds before each breath in (inhalation). °4. Place the open end of the spacer onto the mouthpiece of the inhaler. °5. Position the inhaler so that the top of the canister faces up and the spacer mouthpiece faces you. °6. Put your index finger on the top of the medicine canister. Your thumb supports the bottom of the inhaler and the spacer. °7. Breathe out (exhale) normally and as completely as possible. °8. Immediately after exhaling, place the spacer between your teeth and into your mouth. Close your lips tightly around the spacer. °9. Press the canister down with your index finger to release the medicine. °10. At the same time as the canister is pressed, inhale deeply and slowly until your lungs are completely filled. This should take 4-6 seconds. Keep your tongue down and out of the way. °11. Hold the medicine in your lungs for 5-10 seconds (10 seconds is best). This helps the   medicine get into the small airways of your lungs. Exhale. °12. Repeat inhaling deeply through the spacer mouthpiece. Again hold that breath for up to 10 seconds (10 seconds is best). Exhale slowly. If it is difficult to take this second deep breath through the spacer, breathe normally several times through the spacer. Remove the spacer from your mouth. °13. Wait at least 15-30 seconds between puffs. Continue with the above steps until you have taken the number of puffs your health care provider has  ordered. Do not use the inhaler more than your health care provider tells you. °14. Remove the spacer from the inhaler, and place the cap on the inhaler. °15. Follow the directions from your health care provider or the inhaler insert for cleaning the inhaler and spacer. °If you are using different kinds of inhalers, use your quick relief medicine to open the airways 10-15 minutes before using a steroid if instructed to do so by your health care provider. If you are unsure which inhalers to use and the order of using them, ask your health care provider, nurse, or respiratory therapist. °If you are using a steroid inhaler, always rinse your mouth with water after your last puff, then gargle and spit out the water. Do not swallow the water. °AVOID: °· Inhaling before or after starting the spray of medicine. It takes practice to coordinate your breathing with triggering the spray. °· Inhaling through the nose (rather than the mouth) when triggering the spray. °HOW TO DETERMINE IF YOUR INHALER IS FULL OR NEARLY EMPTY °You cannot know when an inhaler is empty by shaking it. A few inhalers are now being made with dose counters. Ask your health care provider for a prescription that has a dose counter if you feel you need that extra help. If your inhaler does not have a counter, ask your health care provider to help you determine the date you need to refill your inhaler. Write the refill date on a calendar or your inhaler canister. Refill your inhaler 7-10 days before it runs out. Be sure to keep an adequate supply of medicine. This includes making sure it is not expired, and that you have a spare inhaler.  °SEEK MEDICAL CARE IF:  °· Your symptoms are only partially relieved with your inhaler. °· You are having trouble using your inhaler. °· You have some increase in phlegm. °SEEK IMMEDIATE MEDICAL CARE IF:  °· You feel little or no relief with your inhalers. You are still wheezing and are feeling shortness of breath or  tightness in your chest or both. °· You have dizziness, headaches, or a fast heart rate. °· You have chills, fever, or night sweats. °· You have a noticeable increase in phlegm production, or there is blood in the phlegm. °MAKE SURE YOU:  °· Understand these instructions. °· Will watch your condition. °· Will get help right away if you are not doing well or get worse. °  °This information is not intended to replace advice given to you by your health care provider. Make sure you discuss any questions you have with your health care provider. °  °Document Released: 11/21/2000 Document Revised: 09/14/2013 Document Reviewed: 06/23/2013 °Elsevier Interactive Patient Education ©2016 [employer] ° °

## 2016-02-08 NOTE — ED Provider Notes (Signed)
CSN: [id number]     Arrival date & time 02/08/16  1711 History   First MD Initiated Contact with Patient 02/08/16 1720     Chief Complaint  Patient presents with  . Cough     HPI Patient presents with 2 weeks of generalized illness, and persistent cough. Since onset, patient has had persistent symptoms, no relief with OTC medication, nor with initiation of levofloxacin yesterday. He denies fever, acknowledges chills. He denies abdominal pain, vomiting, acknowledges diarrhea. Patient continues to smoke, but denies chest pain.   Smoking cessation provided, particularly in light of this patient's evaluation in the ED.     Past Medical History  Diagnosis Date  . Pneumonia   . Hypercholesterolemia    Past Surgical History  Procedure Laterality Date  . Arm surgery      LEFT  . Leg surgery      LEFT   Family History  Problem Relation Age of Onset  . Asthma Father   . Asthma Mother   . Emphysema Mother     smoked  . Emphysema Father     smoked   Social History  Substance Use Topics  . Smoking status: Current Every Day Smoker -- 0.50 packs/day for 27 years    Types: Cigarettes  . Smokeless tobacco: Never Used  . Alcohol Use: No    Review of Systems  Constitutional:       Per HPI, otherwise negative  HENT:       Per HPI, otherwise negative  Respiratory: Positive for wheezing.   Cardiovascular:       Per HPI, otherwise negative  Gastrointestinal: Positive for diarrhea. Negative for vomiting and abdominal pain.  Endocrine:       Negative aside from HPI  Genitourinary:       Neg aside from HPI   Musculoskeletal:       Per HPI, otherwise negative  Skin: Negative.   Neurological: Negative for syncope.      Allergies  Review of patient's allergies indicates no known allergies.  Home Medications   Prior to Admission medications   Medication Sig Start Date End Date Taking? Authorizing Provider  azithromycin (ZITHROMAX) 250 MG tablet Take 1 tablet (250 mg  total) by mouth daily. Take first 2 tablets together, then 1 every day until finished. 09/01/15   Lyndal Pulley, MD  benzonatate (TESSALON) 100 MG capsule Take 1 capsule (100 mg total) by mouth every 8 (eight) hours. 09/01/15   Lyndal Pulley, MD  cefdinir (OMNICEF) 300 MG capsule Take 300 mg by mouth daily. 02/16/14   Historical Provider, MD  HYDROcodone-acetaminophen (NORCO) 5-325 MG per tablet Take 1 tablet by mouth every 4 (four) hours as needed for moderate pain. 02/21/14   Nyoka Cowden, MD  naproxen (NAPROSYN) 500 MG tablet Take 1 tablet (500 mg total) by mouth 2 (two) times daily. 03/03/14   Dione Booze, MD   BP 156/84 mmHg  Pulse 93  Temp(Src) 98 F (36.7 C) (Oral)  Resp 22  Ht 5\' 7"  (1.702 m)  Wt 225 lb (102.059 kg)  BMI 35.23 kg/m2  SpO2 97% Physical Exam  Constitutional: He is oriented to person, place, and time. He appears well-developed. No distress.  HENT:  Head: Normocephalic and atraumatic.  Eyes: Conjunctivae and EOM are normal.  Cardiovascular: Normal rate and regular rhythm.   Pulmonary/Chest: No stridor. Tachypnea noted. He has wheezes.  Abdominal: He exhibits no distension.  Musculoskeletal: He exhibits no edema.  Neurological: He is alert and oriented  to person, place, and time.  Skin: Skin is warm and dry.  Psychiatric: He has a normal mood and affect.  Nursing note and vitals reviewed.   ED Course  Procedures (including critical care time) Labs Review Labs Reviewed  COMPREHENSIVE METABOLIC PANEL - Abnormal; Notable for the following:    Chloride 99 (*)    Total Protein 8.2 (*)    AST 73 (*)    All other components within normal limits  CBC WITH DIFFERENTIAL/PLATELET - Abnormal; Notable for the following:    RBC 5.82 (*)    Hemoglobin 17.4 (*)    All other components within normal limits  LIPASE, BLOOD  BRAIN NATRIURETIC PEPTIDE    Imaging Review Dg Chest 2 View  02/08/2016  CLINICAL DATA:  Two weeks of coughing. EXAM: CHEST  2 VIEW COMPARISON:   02/06/2016 FINDINGS: Cardiac silhouette normal in size and configuration. Normal mediastinal and hilar contours. Prominent bronchovascular markings are similar to the prior exam. There is no evidence of pneumonia or pulmonary edema. No pleural effusion or pneumothorax. Skeletal structures are unremarkable. IMPRESSION: No acute cardiopulmonary disease. Electronically Signed   By: Amie Portland M.D.   On: 02/08/2016 17:53   I have personally reviewed and evaluated these images and lab results as part of my medical decision-making.  8:37 PM Patient appears better, breath sounds are improved, he states that he feels better as well. We reviewed all findings, need to follow-up with primary care, need to stop smoking, again.  MDM  Patient presents with concern of ongoing cough, and most discomfort, weakness. Here the patient is awake, alert, but has tachypnea, wheezing on exam. Patient is a smoker. Here, no evidence for pneumonia, but there is evidence for bronchitis. Patient improved here after initiation of steroids, albuterol. Patient discharged in stable condition with outpatient follow-up.   Gerhard Munch, MD 02/08/16 2037

## 2016-03-01 IMAGING — DX DG CHEST 2V
2 series · 2 of 2 positions shown · non-contrast
Comparison: 07/10/2014.

CLINICAL DATA: Cough.

EXAM:
CHEST  2 VIEW

[chest pa]
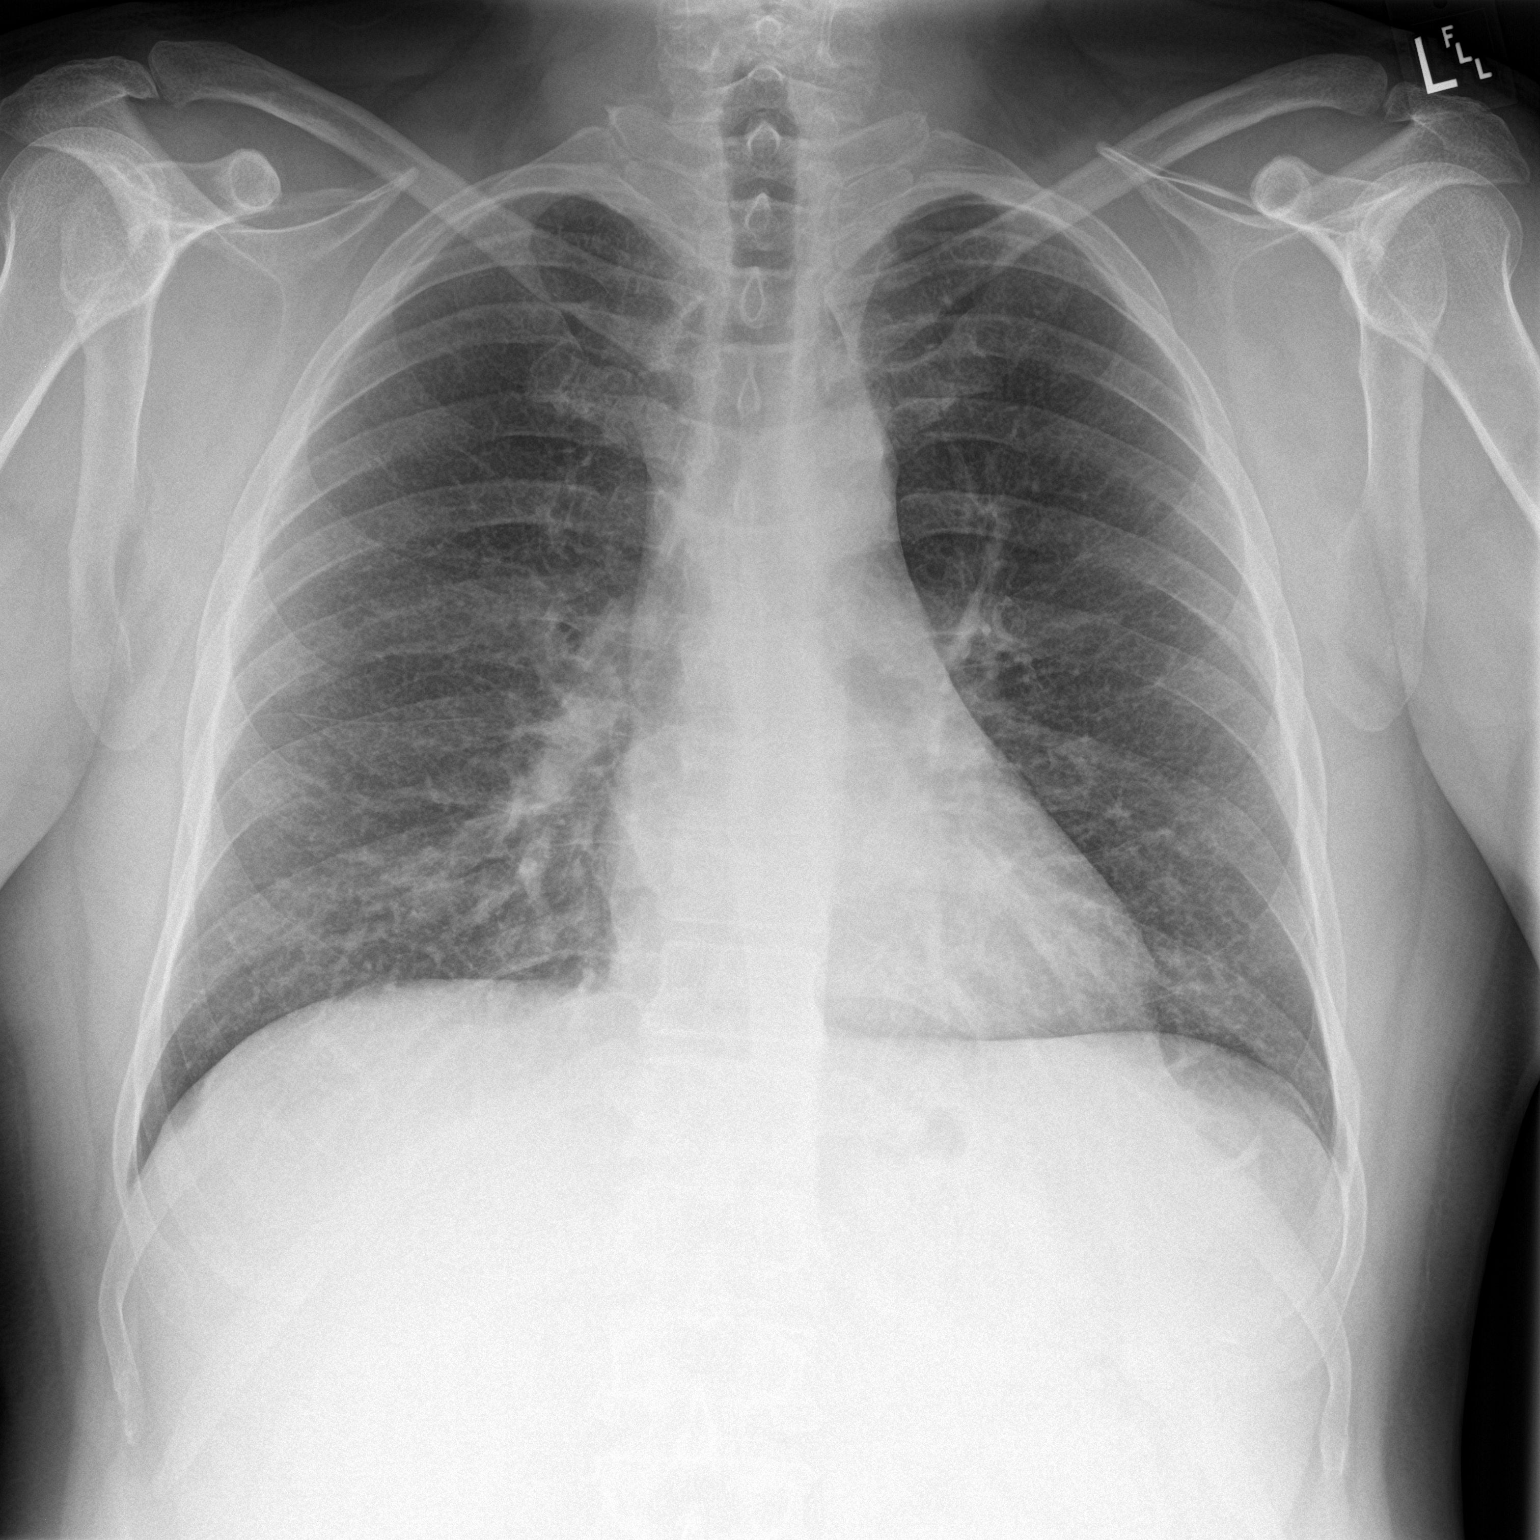

[chest lat]
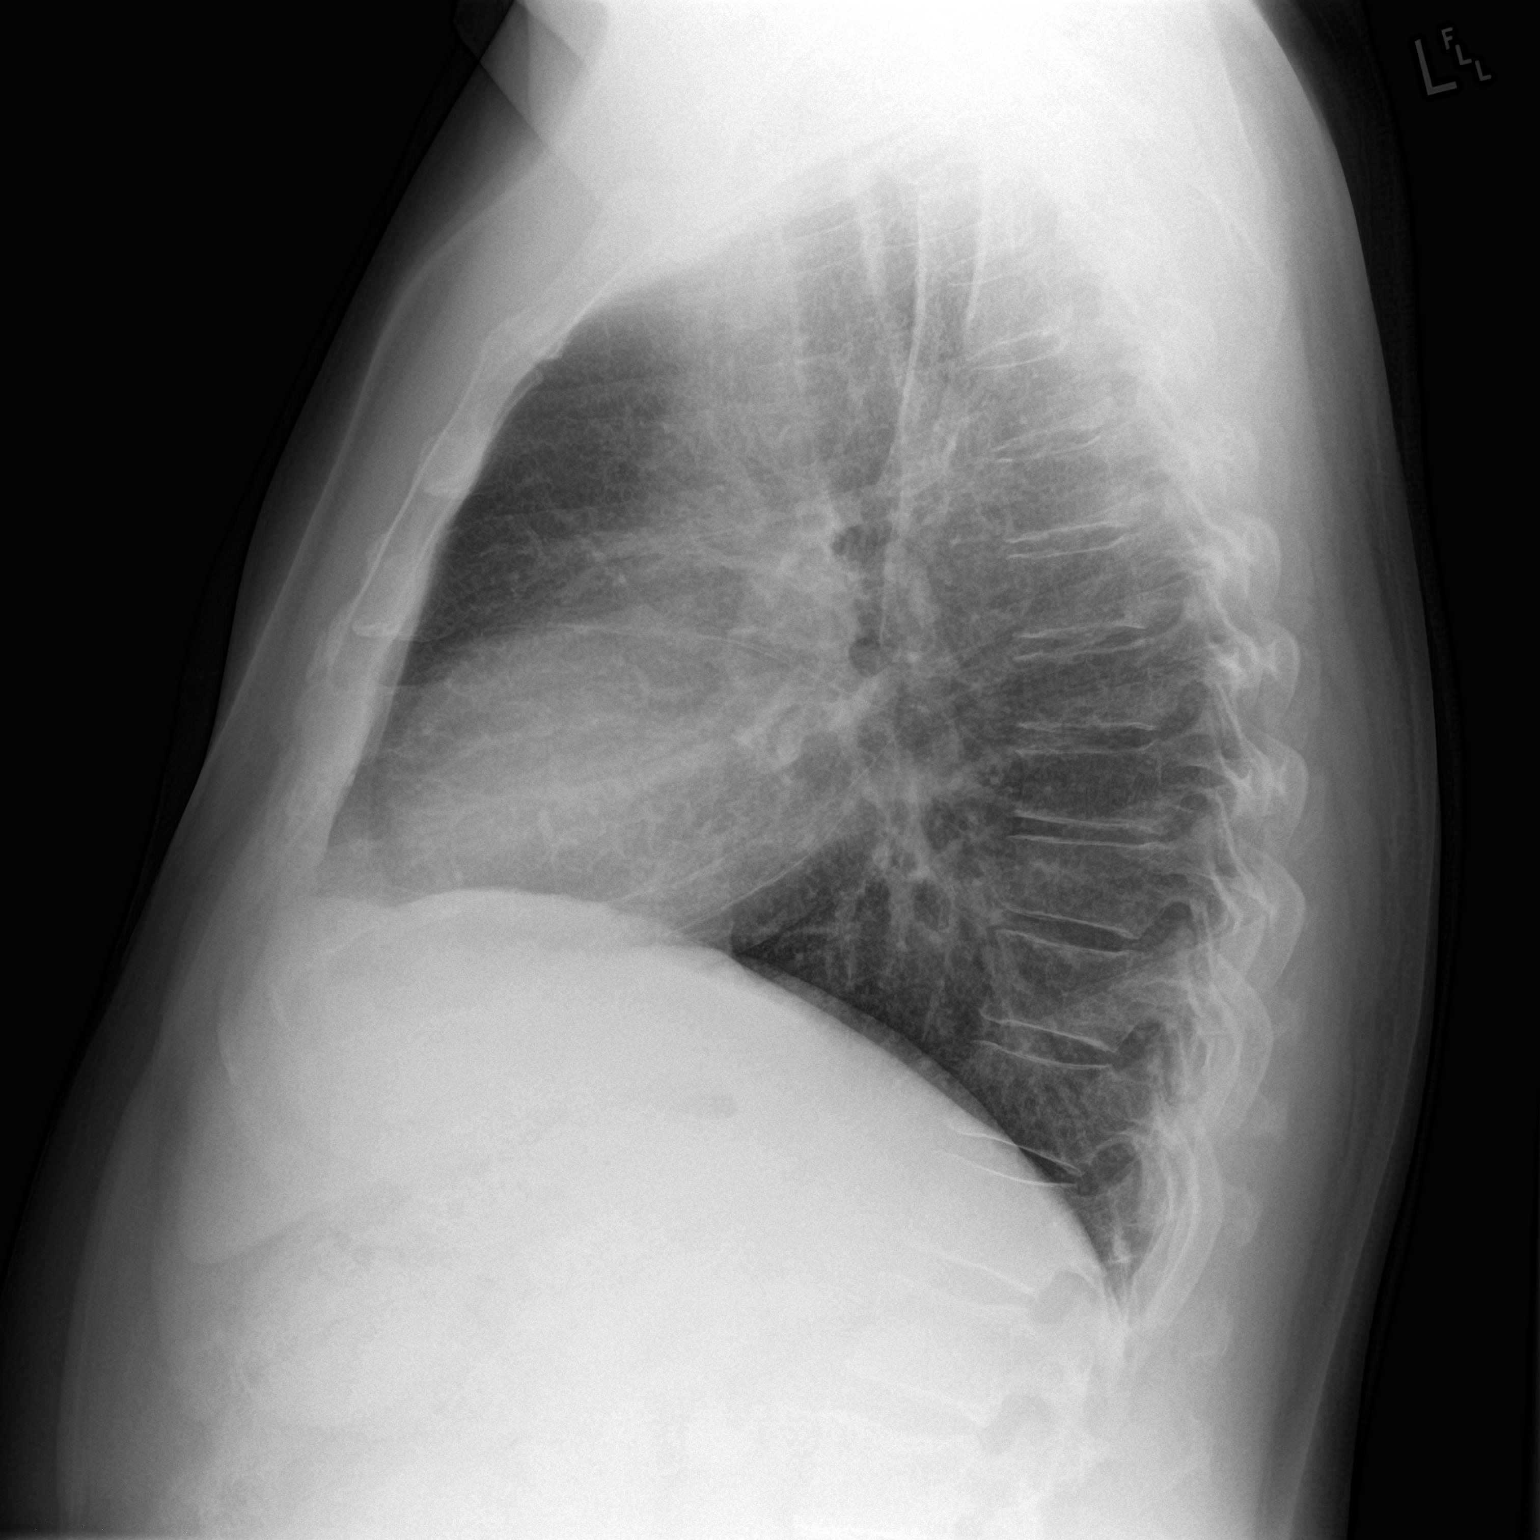

[2 of 2 positions shown; findings below may reference images not displayed]

FINDINGS: Mediastinum and hilar structures are normal. Mild bilateral from
interstitial prominence noted consistent pneumonitis. Heart size
normal. No pleural effusion or pneumothorax. No acute bony
abnormality identified.
IMPRESSION: Mild bilateral pulmonary interstitial prominence noted consistent
with pneumonitis.

## 2016-04-13 ENCOUNTER — Emergency Department (HOSPITAL_COMMUNITY)
Admission: EM | Admit: 2016-04-13 | Discharge: 2016-04-13 | Disposition: A | Discharge status: Home / Self Care | Payer: Medicare Other | Attending: Emergency Medicine | Admitting: Emergency Medicine

## 2016-04-13 ENCOUNTER — Encounter (HOSPITAL_COMMUNITY): Payer: Self-pay | Admitting: Emergency Medicine

## 2016-04-13 ENCOUNTER — Emergency Department (HOSPITAL_COMMUNITY): Payer: Medicare Other

## 2016-04-13 DIAGNOSIS — R109 Unspecified abdominal pain: Secondary | ICD-10-CM | POA: Diagnosis present

## 2016-04-13 DIAGNOSIS — F17210 Nicotine dependence, cigarettes, uncomplicated: Secondary | ICD-10-CM | POA: Diagnosis not present

## 2016-04-13 DIAGNOSIS — R1083 Colic: Secondary | ICD-10-CM

## 2016-04-13 DIAGNOSIS — R1084 Generalized abdominal pain: Secondary | ICD-10-CM | POA: Insufficient documentation

## 2016-04-13 DIAGNOSIS — R197 Diarrhea, unspecified: Secondary | ICD-10-CM | POA: Insufficient documentation

## 2016-04-13 LAB — URINE MICROSCOPIC-ADD ON

## 2016-04-13 LAB — CBC
HEMATOCRIT: 45.4 % (ref 39.0–52.0)
Hemoglobin: 15.2 g/dL (ref 13.0–17.0)
MCH: 29.4 pg (ref 26.0–34.0)
MCHC: 33.5 g/dL (ref 30.0–36.0)
MCV: 87.8 fL (ref 78.0–100.0)
PLATELETS: 150 10*3/uL (ref 150–400)
RBC: 5.17 MIL/uL (ref 4.22–5.81)
RDW: 13.7 % (ref 11.5–15.5)
WBC: 6 10*3/uL (ref 4.0–10.5)

## 2016-04-13 LAB — COMPREHENSIVE METABOLIC PANEL
ALBUMIN: 4 g/dL (ref 3.5–5.0)
ALT: 40 U/L (ref 17–63)
AST: 29 U/L (ref 15–41)
Alkaline Phosphatase: 66 U/L (ref 38–126)
Anion gap: 7 (ref 5–15)
BUN: 8 mg/dL (ref 6–20)
CHLORIDE: 104 mmol/L (ref 101–111)
CO2: 28 mmol/L (ref 22–32)
CREATININE: 0.99 mg/dL (ref 0.61–1.24)
Calcium: 9.2 mg/dL (ref 8.9–10.3)
GFR calc Af Amer: >60 mL/min (ref >60)
GFR calc non Af Amer: >60 mL/min (ref >60)
GLUCOSE: 73 mg/dL (ref 65–99)
POTASSIUM: 3.8 mmol/L (ref 3.5–5.1)
Sodium: 139 mmol/L (ref 135–145)
Total Bilirubin: 0.6 mg/dL (ref 0.3–1.2)
Total Protein: 7.5 g/dL (ref 6.5–8.1)

## 2016-04-13 LAB — URINALYSIS, ROUTINE W REFLEX MICROSCOPIC
BILIRUBIN URINE: NEGATIVE
GLUCOSE, UA: NEGATIVE mg/dL
HGB URINE DIPSTICK: NEGATIVE
KETONES UR: NEGATIVE mg/dL
LEUKOCYTES UA: NEGATIVE
Nitrite: NEGATIVE
Specific Gravity, Urine: 1.025 (ref 1.005–1.030)
pH: 6 (ref 5.0–8.0)

## 2016-04-13 LAB — LIPASE, BLOOD: LIPASE: 31 U/L (ref 11–51)

## 2016-04-13 MED ORDER — SODIUM CHLORIDE 0.9 % IV BOLUS (SEPSIS)
1000.0000 mL | Freq: Once | INTRAVENOUS | Status: AC
  Administered 2016-04-13: 1000 mL via INTRAVENOUS

## 2016-04-13 MED ORDER — DICYCLOMINE HCL 20 MG PO TABS: 20.0000 mg | ORAL_TABLET | Freq: Two times a day (BID) | ORAL | Status: DC

## 2016-04-13 MED ORDER — DIPHENOXYLATE-ATROPINE 2.5-0.025 MG PO TABS: 1.0000 | ORAL_TABLET | Freq: Four times a day (QID) | ORAL | Status: DC | PRN

## 2016-04-13 MED ORDER — ONDANSETRON HCL 4 MG/2ML IJ SOLN
4.0000 mg | Freq: Once | INTRAMUSCULAR | Status: AC
  Administered 2016-04-13: 4 mg via INTRAVENOUS
  Filled 2016-04-13: qty 2

## 2016-04-13 MED ORDER — HYDROMORPHONE HCL 1 MG/ML IJ SOLN
1.0000 mg | Freq: Once | INTRAMUSCULAR | Status: AC
  Administered 2016-04-13: 1 mg via INTRAVENOUS
  Filled 2016-04-13: qty 1

## 2016-04-13 NOTE — ED Notes (Signed)
E-signature pad is not working, pt verbalized understanding of d/c instructions and medications

## 2016-04-13 NOTE — ED Provider Notes (Signed)
CSN: [id number]     Arrival date & time 04/13/16  1424 History   First MD Initiated Contact with Patient 04/13/16 1501     Chief Complaint  Patient presents with  . Flank Pain    HPI  Patient presents for evaluation of left flank and anterior abdominal pain. This had diarrhea for last 3-4 days. Worsening pain for the last 24 hours in his left midabdomen. He goes to his axillary line but not into his flank. It is crampy and intermittent. He has no nausea vomiting or poor appetite. No dysuria or hematuria. Diarrhea for the last 4 days. No blood pus or mucus. No history of stones. No history of known diverticular disease.  Past Medical History  Diagnosis Date  . Pneumonia   . Hypercholesterolemia    Past Surgical History  Procedure Laterality Date  . Arm surgery      LEFT  . Leg surgery      LEFT   Family History  Problem Relation Age of Onset  . Asthma Father   . Asthma Mother   . Emphysema Mother     smoked  . Emphysema Father     smoked   Social History  Substance Use Topics  . Smoking status: Current Every Day Smoker -- 0.50 packs/day for 27 years    Types: Cigarettes  . Smokeless tobacco: Never Used  . Alcohol Use: No    Review of Systems  Constitutional: Negative for fever, chills, diaphoresis, appetite change and fatigue.  HENT: Negative for mouth sores, sore throat and trouble swallowing.   Eyes: Negative for visual disturbance.  Respiratory: Negative for cough, chest tightness, shortness of breath and wheezing.   Cardiovascular: Negative for chest pain.  Gastrointestinal: Positive for abdominal pain and diarrhea. Negative for nausea, vomiting and abdominal distention.  Endocrine: Negative for polydipsia, polyphagia and polyuria.  Genitourinary: Negative for dysuria, frequency and hematuria.  Musculoskeletal: Negative for gait problem.  Skin: Negative for color change, pallor and rash.  Neurological: Negative for dizziness, syncope, light-headedness and  headaches.  Hematological: Does not bruise/bleed easily.  Psychiatric/Behavioral: Negative for behavioral problems and confusion.      Allergies  Review of patient's allergies indicates no known allergies.  Home Medications   Prior to Admission medications   Medication Sig Start Date End Date Taking? Authorizing Provider  benzonatate (TESSALON) 100 MG capsule Take 1 capsule (100 mg total) by mouth every 8 (eight) hours. 09/01/15   Lyndal Pulley, MD  dicyclomine (BENTYL) 20 MG tablet Take 1 tablet (20 mg total) by mouth 2 (two) times daily. 04/13/16   Rolland Porter, MD  diphenoxylate-atropine (LOMOTIL) 2.5-0.025 MG tablet Take 1 tablet by mouth 4 (four) times daily as needed for diarrhea or loose stools. 04/13/16   Rolland Porter, MD  predniSONE (DELTASONE) 20 MG tablet Take 2 tablets (40 mg total) by mouth daily with breakfast. For the next four days 02/08/16   Gerhard Munch, MD   BP 104/61 mmHg  Pulse 62  Temp(Src) 97.5 F (36.4 C) (Oral)  Resp 16  Ht  (1.676 m)  Wt 216 lb 4.8 oz (98.113 kg)  BMI 34.93 kg/m2  SpO2 96% Physical Exam  Constitutional: He is oriented to person, place, and time. He appears well-developed and well-nourished. No distress.  HENT:  Head: Normocephalic.  Eyes: Conjunctivae are normal. Pupils are equal, round, and reactive to light. No scleral icterus.  Neck: Normal range of motion. Neck supple. No thyromegaly present.  Cardiovascular: Normal rate and regular  rhythm.  Exam reveals no gallop and no friction rub.   No murmur heard. Pulmonary/Chest: Effort normal and breath sounds normal. No respiratory distress. He has no wheezes. He has no rales.  Abdominal: Soft. Bowel sounds are normal. He exhibits no distension. There is no tenderness. There is no rebound.    Musculoskeletal: Normal range of motion.  Neurological: He is alert and oriented to person, place, and time.  Skin: Skin is warm and dry. No rash noted.  Psychiatric: He has a normal mood and affect.  His behavior is normal.    ED Course  Procedures (including critical care time) Labs Review Labs Reviewed  URINALYSIS, ROUTINE W REFLEX MICROSCOPIC (NOT AT [facility]) - Abnormal; Notable for the following:    Protein, ur TRACE (*)    All other components within normal limits  URINE MICROSCOPIC-ADD ON - Abnormal; Notable for the following:    Squamous Epithelial / LPF 0-5 (*)    Bacteria, UA FEW (*)    All other components within normal limits  LIPASE, BLOOD  COMPREHENSIVE METABOLIC PANEL  CBC    Imaging Review Ct Abdomen Pelvis Wo Contrast  04/13/2016  CLINICAL DATA:  45 year old male with acute left flank and abdominal pain with diarrhea for 4 days. EXAM: CT ABDOMEN AND PELVIS WITHOUT CONTRAST TECHNIQUE: Multidetector CT imaging of the abdomen and pelvis was performed following the standard protocol without IV contrast. COMPARISON:  04/11/2008 CT FINDINGS: Please note that parenchymal abnormalities may be missed without intravenous contrast. Lower chest:  No acute abnormality. Hepatobiliary: Hepatic steatosis noted without focal hepatic abnormality. The gallbladder is unremarkable. There is no evidence of biliary dilatation. Pancreas: Unremarkable Spleen: Unremarkable Adrenals/Urinary Tract: The kidneys, adrenal glands and bladder are unremarkable. Stomach/Bowel: There is no evidence of bowel obstruction or definite focal bowel wall thickening. The appendix is normal. Vascular/Lymphatic: No aneurysm or suspicious enlarged lymph nodes. Reproductive: Unremarkable Other: No free fluid, focal collection or pneumoperitoneum. Musculoskeletal: No acute or suspicious focal bony abnormality. IMPRESSION: No evidence of acute abnormality. Hepatic steatosis. Electronically Signed   By: Harmon Pier M.D.   On: 04/13/2016 16:30   I have personally reviewed and evaluated these images and lab results as part of my medical decision-making.   EKG Interpretation None      MDM   Final diagnoses:  Diarrhea,  unspecified type  Intestinal colic    Patient describes his pain as "15/10".  I have taken this non-reduced fraction to mean severe pain. He does not appear distressed he is conversant and smiling and interactive. He does not have appreciable tenderness. Does not have difficulty holding still, is not writhing.  Plan will be blood in urine CT scan pain control reevaluation.  17:20:  Reassuring studies. Relief of symptoms. Plans discharge home Lomotil and Bentyl for diarrhea and intestinal colic    Rolland Porter, MD 04/13/16 1720

## 2016-04-13 NOTE — ED Notes (Signed)
Pt c/o Lt sided flank pain that has progressively increased with diarrhea x 4 days. Denies hx of kidney stones. Denies n/v. Denies urinary symptoms.

## 2016-04-13 NOTE — Discharge Instructions (Signed)
Diarrhea  Diarrhea is watery poop (stool). It can make you feel weak, tired, thirsty, or give you a dry mouth (signs of dehydration). Watery poop is a sign of another problem, most often an infection. It often lasts 2-3 days. It can last longer if it is a sign of something serious. Take care of yourself as told by your doctor.  HOME CARE   · Drink 1 cup (8 ounces) of fluid each time you have watery poop.  · Do not drink the following fluids:    Those that contain simple sugars (fructose, glucose, galactose, lactose, sucrose, maltose).    Sports drinks.    Fruit juices.    Whole milk products.    Sodas.    Drinks with caffeine (coffee, tea, soda) or alcohol.  · Oral rehydration solution may be used if the doctor says it is okay. You may make your own solution. Follow this recipe:    ?-? teaspoon table salt.    ¾ teaspoon baking soda.    ? teaspoon salt substitute containing potassium chloride.    1 ? tablespoons sugar.    1 liter (34 ounces) of water.  · Avoid the following foods:    High fiber foods, such as raw fruits and vegetables.    Nuts, seeds, and whole grain breads and cereals.     Those that are sweetened with sugar alcohols (xylitol, sorbitol, mannitol).  · Try eating the following foods:    Starchy foods, such as rice, toast, pasta, low-sugar cereal, oatmeal, baked potatoes, crackers, and bagels.    Bananas.    Applesauce.  · Eat probiotic-rich foods, such as yogurt and milk products that are fermented.  · Wash your hands well after each time you have watery poop.  · Only take medicine as told by your doctor.  · Take a warm bath to help lessen burning or pain from having watery poop.  GET HELP RIGHT AWAY IF:   · You cannot drink fluids without throwing up (vomiting).  · You keep throwing up.  · You have blood in your poop, or your poop looks black and tarry.  · You do not pee (urinate) in 6-8 hours, or there is only a small amount of very dark pee.  · You have belly (abdominal) pain that gets worse or  stays in the same spot (localizes).  · You are weak, dizzy, confused, or light-headed.  · You have a very bad headache.  · Your watery poop gets worse or does not get better.  · You have a fever or lasting symptoms for more than 2-3 days.  · You have a fever and your symptoms suddenly get worse.  MAKE SURE YOU:   · Understand these instructions.  · Will watch your condition.  · Will get help right away if you are not doing well or get worse.     This information is not intended to replace advice given to you by your health care provider. Make sure you discuss any questions you have with your health care provider.     Document Released: 05/12/2008 Document Revised: 12/15/2014 Document Reviewed: 08/01/2012  Elsevier Interactive Patient Education ©2016 [employer]

## 2016-10-27 ENCOUNTER — Emergency Department (HOSPITAL_COMMUNITY): Payer: Medicare Other

## 2016-10-27 ENCOUNTER — Emergency Department (HOSPITAL_COMMUNITY)
Admission: EM | Admit: 2016-10-27 | Discharge: 2016-10-27 | Disposition: A | Discharge status: Home / Self Care | Payer: Medicare Other | Attending: Emergency Medicine | Admitting: Emergency Medicine

## 2016-10-27 DIAGNOSIS — F17210 Nicotine dependence, cigarettes, uncomplicated: Secondary | ICD-10-CM | POA: Insufficient documentation

## 2016-10-27 DIAGNOSIS — J329 Chronic sinusitis, unspecified: Secondary | ICD-10-CM | POA: Diagnosis not present

## 2016-10-27 DIAGNOSIS — R05 Cough: Secondary | ICD-10-CM | POA: Diagnosis present

## 2016-10-27 DIAGNOSIS — J069 Acute upper respiratory infection, unspecified: Secondary | ICD-10-CM | POA: Insufficient documentation

## 2016-10-27 MED ORDER — BENZONATATE 200 MG PO CAPS: 200.0000 mg | ORAL_CAPSULE | Freq: Two times a day (BID) | ORAL | 1 refills | Status: DC

## 2016-10-27 MED ORDER — DEXAMETHASONE 4 MG PO TABS: 4.0000 mg | ORAL_TABLET | Freq: Two times a day (BID) | ORAL | 0 refills | Status: DC

## 2016-10-27 MED ORDER — FEXOFENADINE-PSEUDOEPHED ER 60-120 MG PO TB12: 1.0000 | ORAL_TABLET | Freq: Two times a day (BID) | ORAL | 0 refills | Status: DC

## 2016-10-27 NOTE — ED Triage Notes (Signed)
Cough x 1 week green sputum pain in left side of chest

## 2016-10-27 NOTE — ED Provider Notes (Signed)
[facility]-EMER[facility]Y DEPT Provider Note    By signing my name below, I, Tony Doyle, attest that this documentation has been prepared under the direction and in the presence of Tony Quale, PA-C. Electronically Signed: Earmon Doyle, ED Scribe. 10/27/16. 7:40 PM.    History   Chief Complaint Chief Complaint  Patient presents with  . Cough    The history is provided by the patient and medical records. No language interpreter was used.    HPI Comments:  Tony Doyle is a 45 y.o. male who presents to the Emergency Department complaining of productive cough of green sputum that began about one week ago. He reports associated body aches with the cough. He has been using his MDI with minimal relief of the symptoms. There are no modifying factors noted. He denies fever, chills, nausea, vomiting. He is a smoker. His PCP is Dr. Sherryll Burger and he does not have an appt with him until next month. He states he has "a little" asthma and "a tiny bit" of COPD.   Past Medical History:  Diagnosis Date  . Hypercholesterolemia   . Pneumonia     Patient Active Problem List   Diagnosis Date Noted  . Chest pain, musculoskeletal 02/22/2014  . Smoker 02/22/2014  . Foot contusion 08/23/2012    Past Surgical History:  Procedure Laterality Date  . arm surgery     LEFT  . LEG SURGERY     LEFT       Home Medications    Prior to Admission medications   Medication Sig Start Date End Date Taking? Authorizing Provider  benzonatate (TESSALON) 100 MG capsule Take 1 capsule (100 mg total) by mouth every 8 (eight) hours. 09/01/15   Lyndal Pulley, MD  dicyclomine (BENTYL) 20 MG tablet Take 1 tablet (20 mg total) by mouth 2 (two) times daily. 04/13/16   Rolland Porter, MD  diphenoxylate-atropine (LOMOTIL) 2.5-0.025 MG tablet Take 1 tablet by mouth 4 (four) times daily as needed for diarrhea or loose stools. 04/13/16   Rolland Porter, MD  predniSONE (DELTASONE) 20 MG tablet Take 2 tablets (40 mg total) by mouth  daily with breakfast. For the next four days 02/08/16   Gerhard Munch, MD    Family History Family History  Problem Relation Age of Onset  . Asthma Father   . Asthma Mother   . Emphysema Mother     smoked  . Emphysema Father     smoked    Social History Social History  Substance Use Topics  . Smoking status: Current Every Day Smoker    Packs/day: 0.50    Years: 27.00    Types: Cigarettes  . Smokeless tobacco: Never Used  . Alcohol use No     Allergies   Patient has no known allergies.   Review of Systems Review of Systems  Constitutional: Negative for chills and fever.  HENT: Positive for congestion.   Respiratory: Positive for cough.   Gastrointestinal: Negative for nausea and vomiting.  Musculoskeletal: Positive for myalgias.  All other systems reviewed and are negative.    Physical Exam Updated Vital Signs BP 136/79 (BP Location: Left Arm)   Pulse 70   Temp 97.8 F (36.6 C) (Oral)   Resp 20   Ht 5\' 8"  (1.727 m)   Wt 216 lb (98 kg)   SpO2 98%   BMI 32.84 kg/m   Physical Exam  Constitutional: He is oriented to person, place, and time. He appears well-developed and well-nourished.  HENT:  Head:  Normocephalic and atraumatic.  Nasal congestion present.  Neck: Normal range of motion.  Cardiovascular: Normal rate.   Pulmonary/Chest: Effort normal.  Pt speaks in complete sentences. Symmetrical rise and fall of the chest. No accessory muscle use. Lungs clear bilaterally.  Musculoskeletal: Normal range of motion.  Neurological: He is alert and oriented to person, place, and time.  Skin: Skin is warm and dry. Capillary refill takes less than 2 seconds.  Psychiatric: He has a normal mood and affect. His behavior is normal.  Nursing note and vitals reviewed.    ED Treatments / Results  DIAGNOSTIC STUDIES: Oxygen Saturation is 98% on RA, normal by my interpretation.   COORDINATION OF CARE: 7:37 PM- Will prescribe decongestant and steroid. Pt verbalizes  understanding and agrees to plan.  Medications - No data to display  Labs (all labs ordered are listed, but only abnormal results are displayed) Labs Reviewed - No data to display  EKG  EKG Interpretation  Date/Time:  Monday October 27 2016 17:26:44 EST Ventricular Rate:  65 PR Interval:  150 QRS Duration: 96 QT Interval:  402 QTC Calculation: 418 R Axis:   59 Text Interpretation:  Normal sinus rhythm Normal ECG No previous ECGs available Confirmed by ZACKOWSKI  MD, SCOTT [phone]([id number]) on 10/27/2016 5:47:17 PM       Radiology Dg Chest 2 View  Result Date: 10/27/2016 CLINICAL DATA:  Productive cough with green sputum EXAM: CHEST  2 VIEW COMPARISON:  02/08/2016 FINDINGS: Normal heart size. Lungs clear. No pneumothorax. No pleural effusion. IMPRESSION: No active cardiopulmonary disease. Electronically Signed   By: Jolaine ClickArthur  Hoss M.D.   On: 10/27/2016 18:17    Procedures Procedures (including critical care time)  Medications Ordered in ED Medications - No data to display   Initial Impression / Assessment and Plan / ED Course  I have reviewed the triage vital signs and the nursing notes.  Pertinent labs & imaging results that were available during my care of the patient were reviewed by me and considered in my medical decision making (see chart for details).  Clinical Course     **I have reviewed nursing notes, vital signs, and all appropriate lab and imaging results for this patient.*  I personally performed the services described in this documentation, which was scribed in my presence. The recorded information has been reviewed and is accurate.  Final Clinical Impressions(s) / ED Diagnoses  Chest x-ray is negative for acute problem. The exam favors sinusitis and upper respiratory infection. Prescription for Decadron and Allegra-D given to the patient. Patient is to continue current medications. Patient is to follow-up with primary physician or return to the emergency  department if not improving.    Final diagnoses:  Other sinusitis, unspecified chronicity  Upper respiratory tract infection, unspecified type    New Prescriptions Discharge Medication List as of 10/27/2016  8:55 PM    START taking these medications   Details  dexamethasone (DECADRON) 4 MG tablet Take 1 tablet (4 mg total) by mouth 2 (two) times daily with a meal., Starting Mon 10/27/2016, Print    fexofenadine-pseudoephedrine (ALLEGRA-D) 60-120 MG 12 hr tablet Take 1 tablet by mouth every 12 (twelve) hours., Starting Mon 10/27/2016, Print         Tony QualeHobson Nakiyah Beverley, PA-C 10/28/16 0100    Vanetta MuldersScott Zackowski, MD 11/03/16 1343

## 2016-10-27 NOTE — Discharge Instructions (Signed)
Your chest x-ray is negative for acute problem. Your vital signs within normal limits. Your oxygen level is also within normal limits. Please use Tessalon and Decadron 2 times daily with food. Use Allegra-D for congestion. Please increase fluids, wash hands frequently.

## 2016-11-03 ENCOUNTER — Emergency Department (HOSPITAL_COMMUNITY)
Admission: EM | Admit: 2016-11-03 | Discharge: 2016-11-03 | Disposition: A | Discharge status: Home / Self Care | Payer: Medicare Other | Attending: Dermatology | Admitting: Dermatology

## 2016-11-03 ENCOUNTER — Encounter (HOSPITAL_COMMUNITY): Payer: Self-pay | Admitting: *Deleted

## 2016-11-03 DIAGNOSIS — Z5321 Procedure and treatment not carried out due to patient leaving prior to being seen by health care provider: Secondary | ICD-10-CM | POA: Insufficient documentation

## 2016-11-03 DIAGNOSIS — F17210 Nicotine dependence, cigarettes, uncomplicated: Secondary | ICD-10-CM | POA: Insufficient documentation

## 2016-11-03 DIAGNOSIS — J069 Acute upper respiratory infection, unspecified: Secondary | ICD-10-CM | POA: Insufficient documentation

## 2016-11-03 NOTE — ED Triage Notes (Signed)
Pt is here for follow up as his URI has not improved.  Pt continues to have congestion and doesn't feel well.  Pt is a smoker.  No acute distress at this time.  No fever or sob

## 2016-11-03 NOTE — ED Notes (Signed)
Called pt. No answer °

## 2016-11-03 NOTE — ED Notes (Signed)
Called for pt no answer

## 2016-11-03 NOTE — ED Notes (Signed)
Call for pt, no answer 

## 2017-06-30 ENCOUNTER — Emergency Department (HOSPITAL_COMMUNITY)
Admission: EM | Admit: 2017-06-30 | Discharge: 2017-06-30 | Disposition: A | Discharge status: Home / Self Care | Payer: Medicare Other | Attending: Emergency Medicine | Admitting: Emergency Medicine

## 2017-06-30 ENCOUNTER — Encounter (HOSPITAL_COMMUNITY): Payer: Self-pay | Admitting: Emergency Medicine

## 2017-06-30 DIAGNOSIS — F17210 Nicotine dependence, cigarettes, uncomplicated: Secondary | ICD-10-CM | POA: Insufficient documentation

## 2017-06-30 DIAGNOSIS — K0889 Other specified disorders of teeth and supporting structures: Secondary | ICD-10-CM | POA: Diagnosis present

## 2017-06-30 DIAGNOSIS — Z79899 Other long term (current) drug therapy: Secondary | ICD-10-CM | POA: Insufficient documentation

## 2017-06-30 DIAGNOSIS — K047 Periapical abscess without sinus: Secondary | ICD-10-CM | POA: Diagnosis not present

## 2017-06-30 MED ORDER — HYDROCODONE-ACETAMINOPHEN 5-325 MG PO TABS: 1.0000 | ORAL_TABLET | Freq: Four times a day (QID) | ORAL | 0 refills | Status: DC | PRN

## 2017-06-30 MED ORDER — HYDROCODONE-ACETAMINOPHEN 5-325 MG PO TABS
1.0000 | ORAL_TABLET | Freq: Once | ORAL | Status: AC
  Administered 2017-06-30: 1 via ORAL
  Filled 2017-06-30: qty 1

## 2017-06-30 NOTE — Discharge Instructions (Signed)
Take the entire course of the antibiotic your doctor prescribed your yesterday.  You may take the hydrocodone prescribed for pain relief.  This will make you drowsy - do not drive within 4 hours of taking this medication.

## 2017-06-30 NOTE — ED Notes (Signed)
Pt alert & oriented x4, stable gait. Patient given discharge instructions, paperwork & prescription(s). Patient informed not to drive, operate any equipment & handel any important documents 4 hours after taking pain medication. Patient  instructed to stop at the registration desk to finish any additional paperwork. Patient  verbalized understanding. Pt left department w/ no further questions. 

## 2017-06-30 NOTE — ED Triage Notes (Signed)
Pt c/o dental pain x 3 days and has pcp for the same. Pt states the ibuprofen is not helping his pain.

## 2017-06-30 NOTE — ED Provider Notes (Signed)
[facility] Provider Note   CSN: [id number] Arrival date & time: 06/30/17  1949     History   Chief Complaint Chief Complaint  Patient presents with  . Dental Pain    HPI Tony Doyle is a 46 y.o. male presenting with a 3 day history of dental pain and gingival swelling.   The patient has a history of advanced dental decay in the tooth involved which fractured last week and started to cause increased  pain.  There has been no fevers, chills, nausea or vomiting, also no complaint of difficulty swallowing, although chewing makes pain worse.  The patient has tried ibuprofen without relief of symptoms.  He was placed on clindamycin by his pcp yesterday in anticipation of infection improvement so he can have his teeth pulled and fitted for dentures which should start taking place next week once the infection is better.  He has had 6 doses of the clindamycin since yesterday.  .  The history is provided by the patient.    Past Medical History:  Diagnosis Date  . Hypercholesterolemia   . Pneumonia     Patient Active Problem List   Diagnosis Date Noted  . Chest pain, musculoskeletal 02/22/2014  . Smoker 02/22/2014  . Foot contusion 08/23/2012    Past Surgical History:  Procedure Laterality Date  . arm surgery     LEFT  . LEG SURGERY     LEFT       Home Medications    Prior to Admission medications   Medication Sig Start Date End Date Taking? Authorizing Provider  benzonatate (TESSALON) 200 MG capsule Take 1 capsule (200 mg total) by mouth 2 (two) times daily. 10/27/16   [name], PA-C  dexamethasone (DECADRON) 4 MG tablet Take 1 tablet (4 mg total) by mouth 2 (two) times daily with a meal. 10/27/16   [name], PA-C  dicyclomine (BENTYL) 20 MG tablet Take 1 tablet (20 mg total) by mouth 2 (two) times daily. 04/13/16   Rolland Porter, MD  diphenoxylate-atropine (LOMOTIL) 2.5-0.025 MG tablet Take 1 tablet by mouth 4 (four) times daily as needed for diarrhea  or loose stools. 04/13/16   Rolland Porter, MD  fexofenadine-pseudoephedrine (ALLEGRA-D) 60-120 MG 12 hr tablet Take 1 tablet by mouth every 12 (twelve) hours. 10/27/16   [name], PA-C  HYDROcodone-acetaminophen (NORCO/VICODIN) 5-325 MG tablet Take 1 tablet by mouth every 6 (six) hours as needed. 06/30/17   Burgess Amor, PA-C  predniSONE (DELTASONE) 20 MG tablet Take 2 tablets (40 mg total) by mouth daily with breakfast. For the next four days 02/08/16   Gerhard Munch, MD    Family History Family History  Problem Relation Age of Onset  . Asthma Father   . Emphysema Father        smoked  . Asthma Mother   . Emphysema Mother        smoked    Social History Social History  Substance Use Topics  . Smoking status: Current Every Day Smoker    Packs/day: 0.50    Years: 27.00    Types: Cigarettes  . Smokeless tobacco: Never Used  . Alcohol use No     Allergies   Penicillins   Review of Systems Review of Systems  Constitutional: Negative for fever.  HENT: Positive for dental problem. Negative for facial swelling and sore throat.   Respiratory: Negative for shortness of breath.   Musculoskeletal: Negative for neck pain and neck stiffness.     Physical Exam  Updated Vital Signs BP (!) 146/100 (BP Location: Right Arm)   Pulse (!) 57   Temp 97.6 F (36.4 C) (Oral)   Resp 18   Ht 5\' 9"  (1.753 m)   Wt 97.1 kg (214 lb)   SpO2 97%   BMI 31.60 kg/m   Physical Exam  Constitutional: He is oriented to person, place, and time. He appears well-developed and well-nourished. No distress.  HENT:  Head: Normocephalic and atraumatic.  Right Ear: Tympanic membrane and external ear normal.  Left Ear: Tympanic membrane and external ear normal.  Mouth/Throat: Oropharynx is clear and moist and mucous membranes are normal. No oral lesions. No trismus in the jaw. Dental abscesses present.    Eyes: Conjunctivae are normal.  Neck: Normal range of motion. Neck supple.  Cardiovascular:  Normal rate and normal heart sounds.   Pulmonary/Chest: Effort normal.  Abdominal: He exhibits no distension.  Musculoskeletal: Normal range of motion.  Lymphadenopathy:    He has no cervical adenopathy.  Neurological: He is alert and oriented to person, place, and time.  Skin: Skin is warm and dry. No erythema.  Psychiatric: He has a normal mood and affect.     ED Treatments / Results  Labs (all labs ordered are listed, but only abnormal results are displayed) Labs Reviewed - No data to display  EKG  EKG Interpretation None       Radiology No results found.  Procedures Procedures (including critical care time)  Medications Ordered in ED Medications  HYDROcodone-acetaminophen (NORCO/VICODIN) 5-325 MG per tablet 1 tablet (not administered)     Initial Impression / Assessment and Plan / ED Course  I have reviewed the triage vital signs and the nursing notes.  Pertinent labs & imaging results that were available during my care of the patient were reviewed by me and considered in my medical decision making (see chart for details).     Planned f/u with A1 Dentures as planned for definitive tx and dentures. Finish abx, pt was given a small quantity of hydrocodone.   controlled substance database reviewed, no entries.  Obvious painful abscess. No trismus or oral edema compromising oropharynx.  Final Clinical Impressions(s) / ED Diagnoses   Final diagnoses:  Dental abscess    New Prescriptions New Prescriptions   HYDROCODONE-ACETAMINOPHEN (NORCO/VICODIN) 5-325 MG TABLET    Take 1 tablet by mouth every 6 (six) hours as needed.     Burgess Amor, PA-C 06/30/17 2131    Samuel Jester, DO 07/03/17 [phone]

## 2017-06-30 NOTE — ED Notes (Signed)
Pt c/o of left lower dental pain. Seen by PCP & started on abx. And ibuprofen. Pt says it feels like its swelling down throat.

## 2017-11-30 ENCOUNTER — Emergency Department (HOSPITAL_COMMUNITY): Payer: Medicare Other

## 2017-11-30 ENCOUNTER — Encounter (HOSPITAL_COMMUNITY): Payer: Self-pay | Admitting: *Deleted

## 2017-11-30 ENCOUNTER — Emergency Department (HOSPITAL_COMMUNITY)
Admission: EM | Admit: 2017-11-30 | Discharge: 2017-11-30 | Disposition: A | Discharge status: Home / Self Care | Payer: Medicare Other | Attending: Emergency Medicine | Admitting: Emergency Medicine

## 2017-11-30 DIAGNOSIS — J209 Acute bronchitis, unspecified: Secondary | ICD-10-CM | POA: Diagnosis not present

## 2017-11-30 DIAGNOSIS — Z79899 Other long term (current) drug therapy: Secondary | ICD-10-CM | POA: Diagnosis not present

## 2017-11-30 DIAGNOSIS — F17210 Nicotine dependence, cigarettes, uncomplicated: Secondary | ICD-10-CM | POA: Insufficient documentation

## 2017-11-30 DIAGNOSIS — R05 Cough: Secondary | ICD-10-CM | POA: Diagnosis present

## 2017-11-30 DIAGNOSIS — J449 Chronic obstructive pulmonary disease, unspecified: Secondary | ICD-10-CM

## 2017-11-30 HISTORY — DX: Chronic obstructive pulmonary disease, unspecified: J44.9

## 2017-11-30 MED ORDER — PREDNISONE 20 MG PO TABS
ORAL_TABLET | ORAL | Status: AC
  Administered 2017-11-30: 40 mg
  Filled 2017-11-30: qty 2

## 2017-11-30 MED ORDER — AZITHROMYCIN 250 MG PO TABS: 250.0000 mg | ORAL_TABLET | Freq: Every day | ORAL | 0 refills | Status: DC

## 2017-11-30 MED ORDER — PREDNISONE 20 MG PO TABS
40.0000 mg | ORAL_TABLET | Freq: Once | ORAL | Status: AC
  Administered 2017-11-30: 40 mg via ORAL
  Filled 2017-11-30: qty 2

## 2017-11-30 MED ORDER — PREDNISONE 20 MG PO TABS: 40.0000 mg | ORAL_TABLET | Freq: Every day | ORAL | 0 refills | Status: DC

## 2017-11-30 MED ORDER — ALBUTEROL SULFATE HFA 108 (90 BASE) MCG/ACT IN AERS
2.0000 | INHALATION_SPRAY | RESPIRATORY_TRACT | Status: DC | PRN
  Administered 2017-11-30: 2 via RESPIRATORY_TRACT
  Filled 2017-11-30: qty 6.7

## 2017-11-30 NOTE — ED Provider Notes (Signed)
[facility] EMERGENCY DEPARTMENT Provider Note   CSN: [id number] Arrival date & time: 11/30/17  6644     History   Chief Complaint Chief Complaint  Patient presents with  . Cough    HPI Tony Doyle is a 46 y.o. male.  HPI Patient with history of COPD.  Has been coughing for the last 2-1/2 days with increased sputum production.  States it started clear then went to green and is now clear again.  States his voice has gotten more harsh also.  Slight chest tightness.  No edema in his legs.  He does continue to smoke.  States he is only been able to smoke around 7 cigarettes in the last 2 days however. Past Medical History:  Diagnosis Date  . COPD (chronic obstructive pulmonary disease) (HCC)   . Hypercholesterolemia   . Pneumonia     Patient Active Problem List   Diagnosis Date Noted  . Chest pain, musculoskeletal 02/22/2014  . Smoker 02/22/2014  . Foot contusion 08/23/2012    Past Surgical History:  Procedure Laterality Date  . arm surgery     LEFT  . LEG SURGERY     LEFT       Home Medications    Prior to Admission medications   Medication Sig Start Date End Date Taking? Authorizing Provider  azithromycin (ZITHROMAX) 250 MG tablet Take 1 tablet (250 mg total) by mouth daily. Take first 2 tablets together, then 1 every day until finished. 11/30/17   Benjiman Core, MD  benzonatate (TESSALON) 200 MG capsule Take 1 capsule (200 mg total) by mouth 2 (two) times daily. 10/27/16   [name], PA-C  dicyclomine (BENTYL) 20 MG tablet Take 1 tablet (20 mg total) by mouth 2 (two) times daily. 04/13/16   Rolland Porter, MD  diphenoxylate-atropine (LOMOTIL) 2.5-0.025 MG tablet Take 1 tablet by mouth 4 (four) times daily as needed for diarrhea or loose stools. 04/13/16   Rolland Porter, MD  fexofenadine-pseudoephedrine (ALLEGRA-D) 60-120 MG 12 hr tablet Take 1 tablet by mouth every 12 (twelve) hours. 10/27/16   [name], PA-C  HYDROcodone-acetaminophen (NORCO/VICODIN)  5-325 MG tablet Take 1 tablet by mouth every 6 (six) hours as needed. 06/30/17   Burgess Amor, PA-C  predniSONE (DELTASONE) 20 MG tablet Take 2 tablets (40 mg total) by mouth daily. 12/01/17   Benjiman Core, MD  predniSONE (DELTASONE) 20 MG tablet Take 2 tablets (40 mg total) by mouth daily. 12/02/17   Benjiman Core, MD    Family History Family History  Problem Relation Age of Onset  . Asthma Father   . Emphysema Father        smoked  . Asthma Mother   . Emphysema Mother        smoked    Social History Social History   Tobacco Use  . Smoking status: Current Every Day Smoker    Packs/day: 0.50    Years: 27.00    Pack years: 13.50    Types: Cigarettes  . Smokeless tobacco: Never Used  Substance Use Topics  . Alcohol use: No  . Drug use: No     Allergies   Penicillins   Review of Systems Review of Systems  Constitutional: Negative for chills and fatigue.  HENT: Positive for voice change. Negative for sinus pain and trouble swallowing.   Respiratory: Positive for cough and shortness of breath.   Cardiovascular: Negative for chest pain and leg swelling.  Gastrointestinal: Negative for abdominal pain.  Genitourinary: Negative for flank pain.  Musculoskeletal: Negative for back pain.  Psychiatric/Behavioral: Negative for confusion.     Physical Exam Updated Vital Signs BP (!) 141/84   Pulse 73   Temp 98.7 F (37.1 C) (Temporal)   Resp 18   Ht 5\' 8"  (1.727 m)   Wt 98.9 kg (218 lb)   SpO2 97%   BMI 33.15 kg/m   Physical Exam  Constitutional: He appears well-developed.  HENT:  Head: Normocephalic.  Patient has a hoarse voice.  Minimal posterior pharyngeal erythema without exudate.  Eyes: Pupils are equal, round, and reactive to light.  Neck: Neck supple.  Cardiovascular: Normal rate.  Pulmonary/Chest:  Harsh voice without focal rales or rhonchi.  No respiratory distress.  Abdominal: Soft. There is no tenderness.  Musculoskeletal: He exhibits no  edema.  Neurological: He is alert.  Skin: Skin is warm. Capillary refill takes less than 2 seconds.     ED Treatments / Results  Labs (all labs ordered are listed, but only abnormal results are displayed) Labs Reviewed - No data to display  EKG  EKG Interpretation None       Radiology Dg Chest 2 View  Result Date: 11/30/2017 CLINICAL DATA:  Chest pain shortness of breath EXAM: CHEST  2 VIEW COMPARISON:  10/27/2016 FINDINGS: The heart size and mediastinal contours are within normal limits. Both lungs are clear. The visualized skeletal structures are unremarkable. IMPRESSION: No active cardiopulmonary disease. Electronically Signed   By: Jasmine PangKim  Fujinaga M.D.   On: 11/30/2017 19:47    Procedures Procedures (including critical care time)  Medications Ordered in ED Medications  albuterol (PROVENTIL HFA;VENTOLIN HFA) 108 (90 Base) MCG/ACT inhaler 2 puff (not administered)  predniSONE (DELTASONE) tablet 40 mg (not administered)     Initial Impression / Assessment and Plan / ED Course  I have reviewed the triage vital signs and the nursing notes.  Pertinent labs & imaging results that were available during my care of the patient were reviewed by me and considered in my medical decision making (see chart for details).     Patient with shortness of breath.  COPD history.  With increased sputum production will treat with steroids and antibiotics.  Steroids given here and for home.  Given new inhaler since his albuterol is run out.  Discharge home.  Well-appearing. Final Clinical Impressions(s) / ED Diagnoses   Final diagnoses:  Acute bronchitis, unspecified organism  Chronic obstructive pulmonary disease, unspecified COPD type [facility](HCC)    ED Discharge Orders        Ordered    predniSONE (DELTASONE) 20 MG tablet  Daily     11/30/17 2045    predniSONE (DELTASONE) 20 MG tablet  Daily     11/30/17 2043    azithromycin (ZITHROMAX) 250 MG tablet  Daily     11/30/17 2045         Benjiman CorePickering, Arely Tinner, MD 11/30/17 2057

## 2017-11-30 NOTE — ED Triage Notes (Signed)
Pt with cough-productive at times, chest pain with coughing, voice hoarse for 2.5 days.

## 2018-03-08 DIAGNOSIS — R69 Illness, unspecified: Secondary | ICD-10-CM | POA: Diagnosis not present

## 2018-03-08 DIAGNOSIS — E7800 Pure hypercholesterolemia, unspecified: Secondary | ICD-10-CM | POA: Diagnosis not present

## 2018-03-08 DIAGNOSIS — J449 Chronic obstructive pulmonary disease, unspecified: Secondary | ICD-10-CM | POA: Diagnosis not present

## 2018-04-25 ENCOUNTER — Emergency Department (HOSPITAL_COMMUNITY): Payer: Medicare HMO

## 2018-04-25 ENCOUNTER — Encounter (HOSPITAL_COMMUNITY): Payer: Self-pay | Admitting: Emergency Medicine

## 2018-04-25 ENCOUNTER — Emergency Department (HOSPITAL_COMMUNITY)
Admission: EM | Admit: 2018-04-25 | Discharge: 2018-04-25 | Disposition: A | Discharge status: Home / Self Care | Payer: Medicare HMO | Attending: Emergency Medicine | Admitting: Emergency Medicine

## 2018-04-25 DIAGNOSIS — Z79899 Other long term (current) drug therapy: Secondary | ICD-10-CM | POA: Insufficient documentation

## 2018-04-25 DIAGNOSIS — M25512 Pain in left shoulder: Secondary | ICD-10-CM | POA: Diagnosis not present

## 2018-04-25 DIAGNOSIS — W2209XA Striking against other stationary object, initial encounter: Secondary | ICD-10-CM | POA: Insufficient documentation

## 2018-04-25 DIAGNOSIS — Y92007 Garden or yard of unspecified non-institutional (private) residence as the place of occurrence of the external cause: Secondary | ICD-10-CM | POA: Diagnosis not present

## 2018-04-25 DIAGNOSIS — J449 Chronic obstructive pulmonary disease, unspecified: Secondary | ICD-10-CM | POA: Insufficient documentation

## 2018-04-25 DIAGNOSIS — Y93H9 Activity, other involving exterior property and land maintenance, building and construction: Secondary | ICD-10-CM | POA: Insufficient documentation

## 2018-04-25 DIAGNOSIS — Y999 Unspecified external cause status: Secondary | ICD-10-CM | POA: Diagnosis not present

## 2018-04-25 DIAGNOSIS — R69 Illness, unspecified: Secondary | ICD-10-CM | POA: Diagnosis not present

## 2018-04-25 DIAGNOSIS — F17210 Nicotine dependence, cigarettes, uncomplicated: Secondary | ICD-10-CM | POA: Diagnosis not present

## 2018-04-25 DIAGNOSIS — S4992XA Unspecified injury of left shoulder and upper arm, initial encounter: Secondary | ICD-10-CM | POA: Diagnosis not present

## 2018-04-25 MED ORDER — TRAMADOL HCL 50 MG PO TABS
50.0000 mg | ORAL_TABLET | Freq: Once | ORAL | Status: AC
  Administered 2018-04-25: 50 mg via ORAL
  Filled 2018-04-25: qty 1

## 2018-04-25 MED ORDER — IBUPROFEN 800 MG PO TABS
800.0000 mg | ORAL_TABLET | Freq: Once | ORAL | Status: AC
  Administered 2018-04-25: 800 mg via ORAL
  Filled 2018-04-25: qty 1

## 2018-04-25 NOTE — ED Triage Notes (Signed)
Pt was mowing grass and hit L. Shoulder on side of a tree and thinks he may have dislocated it. Pt can raise shoulder but heard a popping sound.

## 2018-04-25 NOTE — Discharge Instructions (Addendum)
Your x-rays are negative tonight for fracture or dislocation.  I suspect you may have strained or sprained your upper bicep tendons.  Rest your arm using the sling as discussed.  Use medications as prescribed in addition to icing this area 10 minutes every 2 hours through the day.  Plan to see Dr. Romeo Apple for follow-up evaluation if your pain does not improve with this treatment.

## 2018-04-26 DIAGNOSIS — R51 Headache: Secondary | ICD-10-CM | POA: Diagnosis not present

## 2018-04-26 DIAGNOSIS — Z803 Family history of malignant neoplasm of breast: Secondary | ICD-10-CM | POA: Diagnosis not present

## 2018-04-26 DIAGNOSIS — Z791 Long term (current) use of non-steroidal anti-inflammatories (NSAID): Secondary | ICD-10-CM | POA: Diagnosis not present

## 2018-04-26 DIAGNOSIS — Z8249 Family history of ischemic heart disease and other diseases of the circulatory system: Secondary | ICD-10-CM | POA: Diagnosis not present

## 2018-04-26 DIAGNOSIS — Z825 Family history of asthma and other chronic lower respiratory diseases: Secondary | ICD-10-CM | POA: Diagnosis not present

## 2018-04-26 DIAGNOSIS — R69 Illness, unspecified: Secondary | ICD-10-CM | POA: Diagnosis not present

## 2018-04-26 DIAGNOSIS — E785 Hyperlipidemia, unspecified: Secondary | ICD-10-CM | POA: Diagnosis not present

## 2018-04-26 DIAGNOSIS — J449 Chronic obstructive pulmonary disease, unspecified: Secondary | ICD-10-CM | POA: Diagnosis not present

## 2018-04-26 DIAGNOSIS — Z833 Family history of diabetes mellitus: Secondary | ICD-10-CM | POA: Diagnosis not present

## 2018-04-26 DIAGNOSIS — Z720 Tobacco use: Secondary | ICD-10-CM | POA: Diagnosis not present

## 2018-04-27 NOTE — ED Provider Notes (Signed)
[facility] EMERGENCY DEPARTMENT Provider Note   CSN: [id number] Arrival date & time: 04/25/18  1914     History   Chief Complaint Chief Complaint  Patient presents with  . Shoulder Injury    HPI Tony Doyle is a 47 y.o. male describing persistent left shoulder pain which started from an injury 1 week ago.  He was on his riding lawn more and struck his left shoulder against a tree trunk as he was trying to mow underneath the low limits of his tree.  At that time he truly felt he had a shoulder dislocation but with movement it popped back into place.  He has been sore in the shoulder over this week but felt it was improving until he tried to lift an object above shoulder height level today.  With this activity he had a sharp popping sensation and now has worsening pain at the site.  His pain is worsened with attempts at overhead movements, improved but not gone with rest.  There is no radiation of pain he denies weakness or numbness in the arm or hand.  Patient is right-handed.  He has had no treatment prior to arrival..  The history is provided by the patient.    Past Medical History:  Diagnosis Date  . COPD (chronic obstructive pulmonary disease) (HCC)   . Hypercholesterolemia   . Pneumonia     Patient Active Problem List   Diagnosis Date Noted  . Chest pain, musculoskeletal 02/22/2014  . Smoker 02/22/2014  . Foot contusion 08/23/2012    Past Surgical History:  Procedure Laterality Date  . arm surgery     LEFT  . LEG SURGERY     LEFT        Home Medications    Prior to Admission medications   Medication Sig Start Date End Date Taking? Authorizing Provider  azithromycin (ZITHROMAX) 250 MG tablet Take 1 tablet (250 mg total) by mouth daily. Take first 2 tablets together, then 1 every day until finished. 11/30/17   Benjiman Core, MD  benzonatate (TESSALON) 200 MG capsule Take 1 capsule (200 mg total) by mouth 2 (two) times daily. 10/27/16   [name],  PA-C  dicyclomine (BENTYL) 20 MG tablet Take 1 tablet (20 mg total) by mouth 2 (two) times daily. 04/13/16   Rolland Porter, MD  diphenoxylate-atropine (LOMOTIL) 2.5-0.025 MG tablet Take 1 tablet by mouth 4 (four) times daily as needed for diarrhea or loose stools. 04/13/16   Rolland Porter, MD  fexofenadine-pseudoephedrine (ALLEGRA-D) 60-120 MG 12 hr tablet Take 1 tablet by mouth every 12 (twelve) hours. 10/27/16   [name], PA-C  HYDROcodone-acetaminophen (NORCO/VICODIN) 5-325 MG tablet Take 1 tablet by mouth every 6 (six) hours as needed. 06/30/17   Burgess Amor, PA-C  predniSONE (DELTASONE) 20 MG tablet Take 2 tablets (40 mg total) by mouth daily. 12/01/17   Benjiman Core, MD  predniSONE (DELTASONE) 20 MG tablet Take 2 tablets (40 mg total) by mouth daily. 12/02/17   Benjiman Core, MD    Family History Family History  Problem Relation Age of Onset  . Asthma Father   . Emphysema Father        smoked  . Asthma Mother   . Emphysema Mother        smoked    Social History Social History   Tobacco Use  . Smoking status: Current Every Day Smoker    Packs/day: 0.50    Years: 27.00    Pack years: 13.50  Types: Cigarettes  . Smokeless tobacco: Never Used  Substance Use Topics  . Alcohol use: No  . Drug use: No     Allergies   Penicillins   Review of Systems Review of Systems  Constitutional: Negative for fever.  Musculoskeletal: Positive for arthralgias. Negative for joint swelling and myalgias.  Neurological: Negative for weakness and numbness.     Physical Exam Updated Vital Signs BP 129/79 (BP Location: Right Arm)   Pulse 62   Temp 97.6 F (36.4 C) (Oral)   Resp 16   Ht 5\' 8"  (1.727 m)   Wt 92.1 kg (203 lb)   SpO2 98%   BMI 30.87 kg/m   Physical Exam  Constitutional: He appears well-developed and well-nourished.  HENT:  Head: Atraumatic.  Neck: Normal range of motion.  Cardiovascular:  Pulses equal bilaterally  Musculoskeletal: He exhibits  tenderness. He exhibits no deformity.       Left shoulder: He exhibits tenderness. He exhibits no bony tenderness, no swelling, no deformity, no spasm and normal pulse.  Pt is ttp left anterior shoulder.  He has pain with attempts shoulder flexion above shoulder level. Bicep flexion with resistance with minimal pain, no deformity of the bicep muscle or tendon noted.  Neurological: He is alert. He has normal strength. He displays normal reflexes. No sensory deficit.  Skin: Skin is warm and dry.  Psychiatric: He has a normal mood and affect.     ED Treatments / Results  Labs (all labs ordered are listed, but only abnormal results are displayed) Labs Reviewed - No data to display  EKG None  Radiology Dg Shoulder Left  Result Date: 04/25/2018 CLINICAL DATA:  Hit left shoulder on tree branch.  Pain. EXAM: LEFT SHOULDER - 2+ VIEW COMPARISON:  None. FINDINGS: There is no evidence of fracture or dislocation. There is no evidence of arthropathy or other focal bone abnormality. Soft tissues are unremarkable. IMPRESSION: Negative. Electronically Signed   By: Charlett Nose M.D.   On: 04/25/2018 20:13    Procedures Procedures (including critical care time)  Medications Ordered in ED Medications  traMADol (ULTRAM) tablet 50 mg (50 mg Oral Given 04/25/18 2216)  ibuprofen (ADVIL,MOTRIN) tablet 800 mg (800 mg Oral Given 04/25/18 2216)     Initial Impression / Assessment and Plan / ED Course  I have reviewed the triage vital signs and the nursing notes.  Pertinent labs & imaging results that were available during my care of the patient were reviewed by me and considered in my medical decision making (see chart for details).     Imaging was reviewed and discussed with patient.  Suspect he has a tendon strain, cannot rule out rotator cuff injury.  Discussed rest, ice, ibuprofen.  Given arm sling to rest the joint.  Referral to Ortho given for follow-up care for worsening or persistent  symptoms.  Final Clinical Impressions(s) / ED Diagnoses   Final diagnoses:  Acute pain of left shoulder    ED Discharge Orders    None       Victoriano Lain 04/27/18 1409    Doug Sou, MD 04/27/18 1530

## 2018-05-19 DIAGNOSIS — R69 Illness, unspecified: Secondary | ICD-10-CM | POA: Diagnosis not present

## 2018-05-19 DIAGNOSIS — Z6832 Body mass index (BMI) 32.0-32.9, adult: Secondary | ICD-10-CM | POA: Diagnosis not present

## 2018-05-19 DIAGNOSIS — J449 Chronic obstructive pulmonary disease, unspecified: Secondary | ICD-10-CM | POA: Diagnosis not present

## 2018-05-19 DIAGNOSIS — M7061 Trochanteric bursitis, right hip: Secondary | ICD-10-CM | POA: Diagnosis not present

## 2018-05-19 DIAGNOSIS — Z299 Encounter for prophylactic measures, unspecified: Secondary | ICD-10-CM | POA: Diagnosis not present

## 2018-05-19 DIAGNOSIS — E7800 Pure hypercholesterolemia, unspecified: Secondary | ICD-10-CM | POA: Diagnosis not present

## 2018-07-09 DIAGNOSIS — Z299 Encounter for prophylactic measures, unspecified: Secondary | ICD-10-CM | POA: Diagnosis not present

## 2018-07-09 DIAGNOSIS — J449 Chronic obstructive pulmonary disease, unspecified: Secondary | ICD-10-CM | POA: Diagnosis not present

## 2018-07-09 DIAGNOSIS — Z7189 Other specified counseling: Secondary | ICD-10-CM | POA: Diagnosis not present

## 2018-07-09 DIAGNOSIS — Z1339 Encounter for screening examination for other mental health and behavioral disorders: Secondary | ICD-10-CM | POA: Diagnosis not present

## 2018-07-09 DIAGNOSIS — Z1331 Encounter for screening for depression: Secondary | ICD-10-CM | POA: Diagnosis not present

## 2018-07-09 DIAGNOSIS — Z0000 Encounter for general adult medical examination without abnormal findings: Secondary | ICD-10-CM | POA: Diagnosis not present

## 2018-07-09 DIAGNOSIS — R5383 Other fatigue: Secondary | ICD-10-CM | POA: Diagnosis not present

## 2018-07-09 DIAGNOSIS — E7800 Pure hypercholesterolemia, unspecified: Secondary | ICD-10-CM | POA: Diagnosis not present

## 2018-07-09 DIAGNOSIS — Z79899 Other long term (current) drug therapy: Secondary | ICD-10-CM | POA: Diagnosis not present

## 2018-07-09 DIAGNOSIS — Z6831 Body mass index (BMI) 31.0-31.9, adult: Secondary | ICD-10-CM | POA: Diagnosis not present

## 2018-09-08 DIAGNOSIS — R202 Paresthesia of skin: Secondary | ICD-10-CM | POA: Diagnosis not present

## 2018-09-08 DIAGNOSIS — Z299 Encounter for prophylactic measures, unspecified: Secondary | ICD-10-CM | POA: Diagnosis not present

## 2018-09-08 DIAGNOSIS — R69 Illness, unspecified: Secondary | ICD-10-CM | POA: Diagnosis not present

## 2018-09-08 DIAGNOSIS — Z23 Encounter for immunization: Secondary | ICD-10-CM | POA: Diagnosis not present

## 2018-09-08 DIAGNOSIS — Z6831 Body mass index (BMI) 31.0-31.9, adult: Secondary | ICD-10-CM | POA: Diagnosis not present

## 2018-09-08 DIAGNOSIS — E7800 Pure hypercholesterolemia, unspecified: Secondary | ICD-10-CM | POA: Diagnosis not present

## 2018-09-12 ENCOUNTER — Emergency Department (HOSPITAL_COMMUNITY)
Admission: EM | Admit: 2018-09-12 | Discharge: 2018-09-12 | Disposition: A | Discharge status: Home / Self Care | Payer: Medicare HMO | Attending: Emergency Medicine | Admitting: Emergency Medicine

## 2018-09-12 ENCOUNTER — Other Ambulatory Visit: Payer: Self-pay

## 2018-09-12 ENCOUNTER — Encounter (HOSPITAL_COMMUNITY): Payer: Self-pay | Admitting: Emergency Medicine

## 2018-09-12 DIAGNOSIS — Y9389 Activity, other specified: Secondary | ICD-10-CM | POA: Diagnosis not present

## 2018-09-12 DIAGNOSIS — Y9289 Other specified places as the place of occurrence of the external cause: Secondary | ICD-10-CM | POA: Insufficient documentation

## 2018-09-12 DIAGNOSIS — Y998 Other external cause status: Secondary | ICD-10-CM | POA: Insufficient documentation

## 2018-09-12 DIAGNOSIS — F17210 Nicotine dependence, cigarettes, uncomplicated: Secondary | ICD-10-CM | POA: Diagnosis not present

## 2018-09-12 DIAGNOSIS — S61011A Laceration without foreign body of right thumb without damage to nail, initial encounter: Secondary | ICD-10-CM | POA: Diagnosis not present

## 2018-09-12 DIAGNOSIS — W458XXA Other foreign body or object entering through skin, initial encounter: Secondary | ICD-10-CM | POA: Insufficient documentation

## 2018-09-12 DIAGNOSIS — E7800 Pure hypercholesterolemia, unspecified: Secondary | ICD-10-CM | POA: Insufficient documentation

## 2018-09-12 DIAGNOSIS — Z79899 Other long term (current) drug therapy: Secondary | ICD-10-CM | POA: Insufficient documentation

## 2018-09-12 DIAGNOSIS — J449 Chronic obstructive pulmonary disease, unspecified: Secondary | ICD-10-CM | POA: Insufficient documentation

## 2018-09-12 DIAGNOSIS — R69 Illness, unspecified: Secondary | ICD-10-CM | POA: Diagnosis not present

## 2018-09-12 MED ORDER — LIDOCAINE HCL (PF) 1 % IJ SOLN
INTRAMUSCULAR | Status: AC
  Filled 2018-09-12: qty 2

## 2018-09-12 MED ORDER — LIDOCAINE HCL (PF) 1 % IJ SOLN
2.0000 mL | Freq: Once | INTRAMUSCULAR | Status: DC
Start: 2018-09-12 — End: 2018-09-12

## 2018-09-12 MED ORDER — POVIDONE-IODINE 10 % OINT PACKET: TOPICAL_OINTMENT | Freq: Once | CUTANEOUS | Status: DC

## 2018-09-12 MED ORDER — POVIDONE-IODINE 10 % EX SOLN
CUTANEOUS | Status: AC
  Filled 2018-09-12: qty 15

## 2018-09-12 MED ORDER — TETANUS-DIPHTH-ACELL PERTUSSIS 5-2.5-18.5 LF-MCG/0.5 IM SUSP
0.5000 mL | Freq: Once | INTRAMUSCULAR | Status: AC
  Administered 2018-09-12: 0.5 mL via INTRAMUSCULAR
  Filled 2018-09-12: qty 0.5

## 2018-09-12 NOTE — ED Triage Notes (Signed)
Patient c/o laceration to right thumb. Per patient slipped and cut finger while changing tin roof. Patient unsure of last tetanus vaccination. Denies taking any type of anticoagulants.

## 2018-09-12 NOTE — ED Notes (Signed)
Telfa non stick dressing applied. 

## 2018-09-12 NOTE — Discharge Instructions (Addendum)
Your wound was repaired with 4 absorbable sutures.  The should come out and about 7 to 10 days.  Please see your primary physician or return to the emergency department if there is any unusual redness, red streaking going up the arm, or pus like material draining from the sutured area.  Please keep the wound clean and dry.  Please change the dressing daily.

## 2018-09-12 NOTE — ED Provider Notes (Signed)
[facility] EMERGENCY DEPARTMENT Provider Note   CSN: [id number] Arrival date & time: 09/12/18  1012     History   Chief Complaint Chief Complaint  Patient presents with  . Laceration    HPI Tony Doyle is a 47 y.o. male.  Patient is a 47 year old male who presents to the emergency department with a complaint of laceration to the right thumb.  Patient states that earlier this morning he slipped while working with a 10 roof, and cut his thumb.  He says the roof was rusty, and he was unsure of his last tetanus so he came in to have it evaluated.  He was able to control the bleeding with pressure.  No other injury reported.  The history is provided by the patient.  Laceration      Past Medical History:  Diagnosis Date  . COPD (chronic obstructive pulmonary disease) (HCC)   . Hypercholesterolemia   . Pneumonia     Patient Active Problem List   Diagnosis Date Noted  . Chest pain, musculoskeletal 02/22/2014  . Smoker 02/22/2014  . Foot contusion 08/23/2012    Past Surgical History:  Procedure Laterality Date  . arm surgery     LEFT  . LEG SURGERY     LEFT        Home Medications    Prior to Admission medications   Medication Sig Start Date End Date Taking? Authorizing Provider  azithromycin (ZITHROMAX) 250 MG tablet Take 1 tablet (250 mg total) by mouth daily. Take first 2 tablets together, then 1 every day until finished. 11/30/17   Benjiman Core, MD  benzonatate (TESSALON) 200 MG capsule Take 1 capsule (200 mg total) by mouth 2 (two) times daily. 10/27/16   [name], PA-C  dicyclomine (BENTYL) 20 MG tablet Take 1 tablet (20 mg total) by mouth 2 (two) times daily. 04/13/16   Rolland Porter, MD  diphenoxylate-atropine (LOMOTIL) 2.5-0.025 MG tablet Take 1 tablet by mouth 4 (four) times daily as needed for diarrhea or loose stools. 04/13/16   Rolland Porter, MD  fexofenadine-pseudoephedrine (ALLEGRA-D) 60-120 MG 12 hr tablet Take 1 tablet by mouth every 12  (twelve) hours. 10/27/16   [name], PA-C  HYDROcodone-acetaminophen (NORCO/VICODIN) 5-325 MG tablet Take 1 tablet by mouth every 6 (six) hours as needed. 06/30/17   Burgess Amor, PA-C  predniSONE (DELTASONE) 20 MG tablet Take 2 tablets (40 mg total) by mouth daily. 12/01/17   Benjiman Core, MD  predniSONE (DELTASONE) 20 MG tablet Take 2 tablets (40 mg total) by mouth daily. 12/02/17   Benjiman Core, MD    Family History Family History  Problem Relation Age of Onset  . Asthma Father   . Emphysema Father        smoked  . Asthma Mother   . Emphysema Mother        smoked    Social History Social History   Tobacco Use  . Smoking status: Current Every Day Smoker    Packs/day: 0.50    Years: 27.00    Pack years: 13.50    Types: Cigarettes  . Smokeless tobacco: Never Used  Substance Use Topics  . Alcohol use: No  . Drug use: No     Allergies   Penicillins   Review of Systems Review of Systems  Constitutional: Negative for activity change.       All ROS Neg except as noted in HPI  HENT: Negative for nosebleeds.   Eyes: Negative for photophobia and discharge.  Respiratory:  Negative for cough, shortness of breath and wheezing.   Cardiovascular: Negative for chest pain and palpitations.  Gastrointestinal: Negative for abdominal pain and blood in stool.  Genitourinary: Negative for dysuria, frequency and hematuria.  Musculoskeletal: Negative for arthralgias, back pain and neck pain.  Skin: Negative.   Neurological: Negative for dizziness, seizures and speech difficulty.  Psychiatric/Behavioral: Negative for confusion and hallucinations.     Physical Exam Updated Vital Signs BP 136/88 (BP Location: Left Arm)   Pulse 85   Temp 97.6 F (36.4 C) (Oral)   Resp 18   Ht 5\' 5"  (1.651 m)   Wt 97.5 kg   SpO2 97%   BMI 35.78 kg/m   Physical Exam  Constitutional: He is oriented to person, place, and time. He appears well-developed and well-nourished.  Non-toxic  appearance.  HENT:  Head: Normocephalic.  Right Ear: Tympanic membrane and external ear normal.  Left Ear: Tympanic membrane and external ear normal.  Eyes: Pupils are equal, round, and reactive to light. EOM and lids are normal.  Neck: Normal range of motion. Neck supple. Carotid bruit is not present.  Cardiovascular: Normal rate, regular rhythm, normal heart sounds, intact distal pulses and normal pulses.  Pulmonary/Chest: Breath sounds normal. No respiratory distress.  Abdominal: Soft. Bowel sounds are normal. There is no tenderness. There is no guarding.  Musculoskeletal: Normal range of motion.       Right hand: He exhibits laceration.       Hands: Lymphadenopathy:       Head (right side): No submandibular adenopathy present.       Head (left side): No submandibular adenopathy present.    He has no cervical adenopathy.  Neurological: He is alert and oriented to person, place, and time. He has normal strength. No cranial nerve deficit or sensory deficit.  Skin: Skin is warm and dry.  Psychiatric: He has a normal mood and affect. His speech is normal.  Nursing note and vitals reviewed.    ED Treatments / Results  Labs (all labs ordered are listed, but only abnormal results are displayed) Labs Reviewed - No data to display  EKG None  Radiology No results found.  Procedures .Marland KitchenLaceration Repair Date/Time: 09/12/2018 12:31 PM Performed by: [name], PA-C Authorized by: [name], PA-C   Consent:    Consent obtained:  Verbal   Consent given by:  Patient   Risks discussed:  Infection, pain, poor cosmetic result and poor wound healing Universal protocol:    Procedure explained and questions answered to patient or proxy's satisfaction: yes     Immediately prior to procedure, a time out was called: yes     Patient identity confirmed:  Arm band Anesthesia (see MAR for exact dosages):    Anesthesia method:  Nerve block   Block location:  Right thumb   Block  needle gauge:  25 G   Block anesthetic:  Lidocaine 2% w/o epi   Block technique:  Digital   Block injection procedure:  Anatomic landmarks identified, introduced needle, incremental injection and negative aspiration for blood   Block outcome:  Anesthesia achieved Laceration details:    Location:  Finger   Finger location:  R thumb   Length (cm):  2.3 Repair type:    Repair type:  Simple Pre-procedure details:    Preparation:  Patient was prepped and draped in usual sterile fashion Exploration:    Wound exploration: wound explored through full range of motion     Wound extent: no foreign bodies/material noted,  no tendon damage noted and no underlying fracture noted     Contaminated: no   Treatment:    Area cleansed with:  Betadine   Amount of cleaning:  Extensive   Irrigation solution:  Sterile saline Skin repair:    Repair method:  Sutures   Suture size:  4-0   Wound skin closure material used: Vicryl-Rapide.   Suture technique:  Simple interrupted   Number of sutures:  4 Approximation:    Approximation:  Close Post-procedure details:    Dressing:  Sterile dressing   Patient tolerance of procedure:  Tolerated well, no immediate complications   (including critical care time)  Medications Ordered in ED Medications  povidone-iodine (BETADINE) 10 % external solution (has no administration in time range)  lidocaine (PF) (XYLOCAINE) 1 % injection 2 mL (has no administration in time range)  lidocaine (PF) (XYLOCAINE) 1 % injection 2 mL (has no administration in time range)  povidone-iodine (BETADINE) 10 % ointment (has no administration in time range)  Tdap (BOOSTRIX) injection 0.5 mL (0.5 mLs Intramuscular Given 09/12/18 1029)     Initial Impression / Assessment and Plan / ED Course  I have reviewed the triage vital signs and the nursing notes.  Pertinent labs & imaging results that were available during my care of the patient were reviewed by me and considered in my medical  decision making (see chart for details).       Final Clinical Impressions(s) / ED Diagnoses MDM  Vital signs are within normal limits.  Pulse oximetry is 97% on room air.  Within normal limits by my interpretation.  Patient has a laceration to the right thumb.  This was irrigated copiously and cleaned with Betadine surgical brush.  No bone or tendon involvement noted.  Wound was closed with 4 sutures of Vicryl-Rapide.  I discussed with the patient the need to keep this wound clean and dry.  We also discussed that the sutures were dissolvable.  The patient will return to the emergency department if any signs of advancing infection.  He is in agreement with this plan.   Final diagnoses:  Laceration of right thumb without foreign body without damage to nail, initial encounter    ED Discharge Orders    None       [name], PA-C 09/12/18 1516    Terrilee Files, MD 09/12/18 [phone]

## 2018-10-25 ENCOUNTER — Ambulatory Visit: Payer: Self-pay | Admitting: Family Medicine

## 2018-11-02 ENCOUNTER — Ambulatory Visit: Payer: Medicare HMO | Admitting: Neurology

## 2018-11-02 ENCOUNTER — Encounter: Payer: Medicare HMO | Admitting: Neurology

## 2018-12-13 ENCOUNTER — Encounter: Payer: Self-pay | Admitting: Family Medicine

## 2018-12-13 ENCOUNTER — Ambulatory Visit (INDEPENDENT_AMBULATORY_CARE_PROVIDER_SITE_OTHER): Payer: Medicare HMO | Admitting: Family Medicine

## 2018-12-13 ENCOUNTER — Ambulatory Visit (HOSPITAL_COMMUNITY)
Admission: RE | Admit: 2018-12-13 | Discharge: 2018-12-13 | Disposition: A | Discharge status: Home / Self Care | Payer: Medicare HMO | Source: Ambulatory Visit | Attending: Family Medicine | Admitting: Family Medicine

## 2018-12-13 VITALS — BP 118/80 | Ht 69.0 in | Wt 223.0 lb

## 2018-12-13 DIAGNOSIS — E7849 Other hyperlipidemia: Secondary | ICD-10-CM

## 2018-12-13 DIAGNOSIS — M5431 Sciatica, right side: Secondary | ICD-10-CM | POA: Diagnosis not present

## 2018-12-13 DIAGNOSIS — F17200 Nicotine dependence, unspecified, uncomplicated: Secondary | ICD-10-CM | POA: Diagnosis not present

## 2018-12-13 DIAGNOSIS — R69 Illness, unspecified: Secondary | ICD-10-CM | POA: Diagnosis not present

## 2018-12-13 DIAGNOSIS — E785 Hyperlipidemia, unspecified: Secondary | ICD-10-CM | POA: Insufficient documentation

## 2018-12-13 DIAGNOSIS — M5416 Radiculopathy, lumbar region: Secondary | ICD-10-CM | POA: Diagnosis not present

## 2018-12-13 MED ORDER — GABAPENTIN 100 MG PO CAPS: 100.0000 mg | ORAL_CAPSULE | Freq: Three times a day (TID) | ORAL | 4 refills | Status: DC

## 2018-12-13 MED ORDER — ATORVASTATIN CALCIUM 10 MG PO TABS: 10.0000 mg | ORAL_TABLET | Freq: Every day | ORAL | 1 refills | Status: DC

## 2018-12-13 NOTE — Progress Notes (Signed)
   Subjective:    Patient ID: Tony Doyle, male    DOB: 1971-02-14, 48 y.o.   MRN: [id number]  HPI Patient is here today due having some right leg burning pain. He states it burns and tingles from hip down to knee cap. He states this has been on going 6-7 weeks now. He has not been taking any medications for this,but states after up for a while the pain eases.  Burning pain discomfort anterior thigh right side lateral thigh hurts more when he is laying on his side denies any injury to it.  Denies back pain denies weakness to the leg.  Hurts when he lays on it hurts to sit up and move been going on for about 6 weeks  Patient does smoke he knows he needs to quit  Cholesterol issue in the past followed by specialist in [facility] but he is moving his care to [place] takes Lipitor 10 mg daily Review of Systems  Constitutional: Negative for activity change, fatigue and fever.  HENT: Negative for congestion and rhinorrhea.   Respiratory: Negative for cough and shortness of breath.   Cardiovascular: Negative for chest pain and leg swelling.  Gastrointestinal: Negative for abdominal pain, diarrhea and nausea.  Genitourinary: Negative for dysuria and hematuria.  Neurological: Negative for weakness and headaches.  Psychiatric/Behavioral: Negative for agitation and behavioral problems.       Objective:   Physical Exam Vitals signs reviewed.  Constitutional:      General: He is not in acute distress. HENT:     Head: Normocephalic and atraumatic.  Eyes:     General:        Right eye: No discharge.        Left eye: No discharge.  Neck:     Trachea: No tracheal deviation.  Cardiovascular:     Rate and Rhythm: Normal rate and regular rhythm.     Heart sounds: Normal heart sounds. No murmur.  Pulmonary:     Effort: Pulmonary effort is normal. No respiratory distress.     Breath sounds: Normal breath sounds.  Lymphadenopathy:     Cervical: No cervical adenopathy.  Skin:    General:  Skin is warm and dry.  Neurological:     Mental Status: He is alert.     Coordination: Coordination normal.  Psychiatric:        Behavior: Behavior normal.   Has good strength in both legs subjective discomfort lateral aspect on the right leg not tender no back pain        Assessment & Plan:  More than likely an impingement of a nerve recommend lumbar spine x-rays also recommend gabapentin slowly titrate up to 100 mg 3 times daily see if this helps may use Tylenol or ibuprofen as needed  Patient encouraged to quit smoking discussed the possible use of Wellbutrin patient will hold off on this currently until the back issue doing better then he will let us know Chantix caused severe side effects Patient encouraged to quit smoking lessen the risk of heart attack Continue Lipitor for cholesterol repeat lab work in 6 months along with wellness exam

## 2018-12-31 ENCOUNTER — Encounter: Payer: Self-pay | Admitting: Family Medicine

## 2019-01-04 ENCOUNTER — Other Ambulatory Visit: Payer: Self-pay | Admitting: Family Medicine

## 2019-01-09 NOTE — Telephone Encounter (Signed)
This will be increased to taking 2 of these 3 times daily Please see my chart message 30-day with 5 refills

## 2019-01-09 NOTE — Telephone Encounter (Signed)
Nurses the gabapentin can be increased I recommend 100 mg Take 2 with these, 3 times daily Hopefully this should help if he is not seeing significant improvement within 2 weeks notify us and we can increase it again  Caution drowsiness Send in 30 days with 5 refills Patient has follow-up office visit in several weeks he should keep this

## 2019-02-11 ENCOUNTER — Encounter: Payer: Self-pay | Admitting: Family Medicine

## 2019-02-11 ENCOUNTER — Ambulatory Visit (INDEPENDENT_AMBULATORY_CARE_PROVIDER_SITE_OTHER): Payer: Medicare HMO | Admitting: Family Medicine

## 2019-02-11 VITALS — BP 120/76 | Ht 69.0 in | Wt 231.0 lb

## 2019-02-11 DIAGNOSIS — M5431 Sciatica, right side: Secondary | ICD-10-CM

## 2019-02-11 MED ORDER — GABAPENTIN 100 MG PO CAPS: ORAL_CAPSULE | ORAL | 1 refills | Status: DC

## 2019-02-11 NOTE — Progress Notes (Signed)
   Subjective:    Patient ID: Tony Doyle, male    DOB: Jan 28, 1971, 48 y.o.   MRN: [id number]  HPI  Patient is here today to follow up on his back pain. Last seen here in the clinic on 12/13/2018. Significant back pain discomfort down the right leg intermittent burning pain discomfort denies any other particular troubles.  Denies any weakness with it no loss of bowel or bladder control On Gabapentin 100 mg three times per day. He was hesitant to go up on the dose without speaking with me face-to-face He states he is still having some pain . He states the pain starts on his right side lower back and radiates down the right leg.  He reports that the pain woke him this am.  He has not quit smoking as of yet,but reports he has decreased the amount he uses.  Review of Systems  Constitutional: Negative for activity change.  HENT: Negative for congestion and rhinorrhea.   Respiratory: Negative for cough and shortness of breath.   Cardiovascular: Negative for chest pain.  Gastrointestinal: Negative for abdominal pain, diarrhea, nausea and vomiting.  Genitourinary: Negative for dysuria and hematuria.  Musculoskeletal: Positive for back pain. Negative for arthralgias.  Neurological: Negative for weakness and headaches.  Psychiatric/Behavioral: Negative for behavioral problems and confusion.       Objective:   Physical Exam Vitals signs reviewed.  Cardiovascular:     Rate and Rhythm: Normal rate and regular rhythm.     Heart sounds: Normal heart sounds. No murmur.  Pulmonary:     Effort: Pulmonary effort is normal.     Breath sounds: Normal breath sounds.  Lymphadenopathy:     Cervical: No cervical adenopathy.  Neurological:     Mental Status: He is alert.  Psychiatric:        Behavior: Behavior normal.     Negative straight leg raise on the left mild increase on the right      Assessment & Plan:  Mild sciatica Recommend increase gabapentin 2 tablets 3 times daily If  progressive troubles or if worse to follow-up Otherwise recheck in a couple months time.  Hold off on MRI currently.

## 2019-04-13 ENCOUNTER — Ambulatory Visit: Payer: Medicare HMO | Admitting: Family Medicine

## 2019-04-14 ENCOUNTER — Ambulatory Visit (INDEPENDENT_AMBULATORY_CARE_PROVIDER_SITE_OTHER): Payer: Medicare HMO | Admitting: Family Medicine

## 2019-04-14 ENCOUNTER — Other Ambulatory Visit: Payer: Self-pay

## 2019-04-14 DIAGNOSIS — Z79899 Other long term (current) drug therapy: Secondary | ICD-10-CM | POA: Diagnosis not present

## 2019-04-14 DIAGNOSIS — J019 Acute sinusitis, unspecified: Secondary | ICD-10-CM

## 2019-04-14 DIAGNOSIS — J301 Allergic rhinitis due to pollen: Secondary | ICD-10-CM | POA: Diagnosis not present

## 2019-04-14 DIAGNOSIS — M5431 Sciatica, right side: Secondary | ICD-10-CM | POA: Diagnosis not present

## 2019-04-14 DIAGNOSIS — E7849 Other hyperlipidemia: Secondary | ICD-10-CM

## 2019-04-14 MED ORDER — HYDROCODONE-ACETAMINOPHEN 5-325 MG PO TABS: ORAL_TABLET | ORAL | 0 refills | Status: DC

## 2019-04-14 MED ORDER — AZITHROMYCIN 250 MG PO TABS: ORAL_TABLET | ORAL | 0 refills | Status: DC

## 2019-04-14 MED ORDER — FLUTICASONE PROPIONATE 50 MCG/ACT NA SUSP: 2.0000 | Freq: Every day | NASAL | 5 refills | Status: DC

## 2019-04-14 NOTE — Progress Notes (Signed)
   Subjective:    Patient ID: Tony Doyle, male    DOB: 07-07-1971, 48 y.o.   MRN: [id number]  Hyperlipidemia  This is a chronic problem. The current episode started more than 1 year ago. Treatments tried: lipitor 10 mg. There are no compliance problems.  Risk factors for coronary artery disease include dyslipidemia.   Patient reports he is still having the same problems with his leg and he stopped the Gabapentin because he states it was not helping him  The patient relates that he has had a lot of leg pain discomfort this does wake him up at night makes it hard for him to go to sleep pain goes from his back down the leg.  Cause a lot of pain discomfort.  He has been dealing with it with conservative measures for over 3 months without success that is on his right side    denies high fever chills sweats wheezing difficulty breathing  Patient also having issues with sinus issues does relate a lot of sinus pain drainage coughing bringing up some yellow-green phlegm  Virtual Visit via Video Note  I connected with Tony Doyle on 04/14/19 at  9:30 AM EDT by a video enabled telemedicine application and verified that I am speaking with the correct person using two identifiers.  Location: Patient: home Provider: office   I discussed the limitations of evaluation and management by telemedicine and the availability of in person appointments. The patient expressed understanding and agreed to proceed.  History of Present Illness:    Observations/Objective:   Assessment and Plan:   Follow Up Instructions:    I discussed the assessment and treatment plan with the patient. The patient was provided an opportunity to ask questions and all were answered. The patient agreed with the plan and demonstrated an understanding of the instructions.   The patient was advised to call back or seek an in-person evaluation if the symptoms worsen or if the condition fails to improve as anticipated.  I  provided 17 minutes of non-face-to-face time during this encounter.       Review of Systems     Objective:   Physical Exam        Assessment & Plan:  Right leg sciatica has not responded to conservative measures I recommend MRI of the lumbar spine await the results of this  Pain medication for occasional use in the evening time to help him with discomfort  Hyperlipidemia check lab work later this summer face-to-face visit in 4 to 6 months continue medication  Sinusitis antibiotic prescribed warning signs discussed follow-up if ongoing troubles

## 2019-05-11 ENCOUNTER — Other Ambulatory Visit: Payer: Self-pay

## 2019-05-11 ENCOUNTER — Ambulatory Visit (HOSPITAL_COMMUNITY)
Admission: RE | Admit: 2019-05-11 | Discharge: 2019-05-11 | Disposition: A | Discharge status: Home / Self Care | Payer: Medicare HMO | Source: Ambulatory Visit | Attending: Family Medicine | Admitting: Family Medicine

## 2019-05-11 DIAGNOSIS — M5127 Other intervertebral disc displacement, lumbosacral region: Secondary | ICD-10-CM | POA: Diagnosis not present

## 2019-05-11 DIAGNOSIS — M5431 Sciatica, right side: Secondary | ICD-10-CM | POA: Insufficient documentation

## 2019-05-13 NOTE — Addendum Note (Signed)
Addended by: Marlowe Shores on: 05/13/2019 02:43 PM   Modules accepted: Orders

## 2019-05-31 ENCOUNTER — Encounter: Payer: Self-pay | Admitting: Family Medicine

## 2019-06-13 DIAGNOSIS — M5136 Other intervertebral disc degeneration, lumbar region: Secondary | ICD-10-CM | POA: Diagnosis not present

## 2019-07-01 ENCOUNTER — Other Ambulatory Visit: Payer: Self-pay

## 2019-07-01 ENCOUNTER — Encounter (HOSPITAL_COMMUNITY): Payer: Self-pay | Admitting: Physical Therapy

## 2019-07-01 ENCOUNTER — Ambulatory Visit (HOSPITAL_COMMUNITY): Payer: Medicare HMO | Attending: Physical Medicine and Rehabilitation | Admitting: Physical Therapy

## 2019-07-01 DIAGNOSIS — R29898 Other symptoms and signs involving the musculoskeletal system: Secondary | ICD-10-CM | POA: Diagnosis not present

## 2019-07-01 DIAGNOSIS — M79604 Pain in right leg: Secondary | ICD-10-CM | POA: Diagnosis not present
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
## 2019-07-01 NOTE — Patient Instructions (Signed)
HIP: Hamstrings - Supine    Place strap around foot. Raise leg up, keep knee straight. Hold _30__ seconds. _3__ reps per set, __1-2_ sets per day, _7__ days per week   Copyright  VHI. All rights reserved.

## 2019-07-06 ENCOUNTER — Other Ambulatory Visit: Payer: Self-pay

## 2019-07-06 ENCOUNTER — Encounter (HOSPITAL_COMMUNITY): Payer: Self-pay

## 2019-07-06 ENCOUNTER — Ambulatory Visit (HOSPITAL_COMMUNITY): Payer: Medicare HMO

## 2019-07-06 DIAGNOSIS — R29898 Other symptoms and signs involving the musculoskeletal system: Secondary | ICD-10-CM | POA: Diagnosis not present

## 2019-07-06 DIAGNOSIS — M79604 Pain in right leg: Secondary | ICD-10-CM
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
## 2019-07-08 ENCOUNTER — Other Ambulatory Visit: Payer: Self-pay | Admitting: Family Medicine

## 2019-07-08 MED ORDER — HYDROCODONE-ACETAMINOPHEN 5-325 MG PO TABS: ORAL_TABLET | ORAL | 0 refills | Status: DC

## 2019-07-12 ENCOUNTER — Other Ambulatory Visit: Payer: Self-pay

## 2019-07-12 ENCOUNTER — Ambulatory Visit (HOSPITAL_COMMUNITY): Payer: Medicare HMO | Attending: Physical Medicine and Rehabilitation

## 2019-07-12 ENCOUNTER — Encounter (HOSPITAL_COMMUNITY): Payer: Self-pay

## 2019-07-12 DIAGNOSIS — R29898 Other symptoms and signs involving the musculoskeletal system: Secondary | ICD-10-CM

## 2019-07-12 DIAGNOSIS — M79604 Pain in right leg: Secondary | ICD-10-CM
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
## 2019-07-15 ENCOUNTER — Ambulatory Visit (HOSPITAL_COMMUNITY): Payer: Medicare HMO | Admitting: Physical Therapy

## 2019-07-19 ENCOUNTER — Other Ambulatory Visit: Payer: Self-pay

## 2019-07-19 ENCOUNTER — Ambulatory Visit (HOSPITAL_COMMUNITY): Payer: Medicare HMO | Admitting: Physical Therapy

## 2019-07-19 ENCOUNTER — Encounter (HOSPITAL_COMMUNITY): Payer: Self-pay | Admitting: Physical Therapy

## 2019-07-19 DIAGNOSIS — M79604 Pain in right leg: Secondary | ICD-10-CM | POA: Diagnosis not present

## 2019-07-19 DIAGNOSIS — R29898 Other symptoms and signs involving the musculoskeletal system: Secondary | ICD-10-CM
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
## 2019-07-19 NOTE — Patient Instructions (Signed)
Access Code: [email or web address]  URL: [email or web address]  Date: 07/19/2019  Prepared by: Harlyn Italiano Bing Matter - 3 reps - 30 second hold - 1x daily - 7x weekly Standing Hamstring Stretch with Step - 3 reps - 30 seconds hold - 1x daily - 7x weekly Seated Piriformis Stretch - 3 reps - 30 second hold - 1x daily - 7x weekly

## 2019-07-21 ENCOUNTER — Ambulatory Visit (HOSPITAL_COMMUNITY): Payer: Medicare HMO | Admitting: Physical Therapy

## 2019-07-25 ENCOUNTER — Other Ambulatory Visit: Payer: Self-pay | Admitting: *Deleted

## 2019-07-25 ENCOUNTER — Telehealth: Payer: Self-pay | Admitting: *Deleted

## 2019-07-25 ENCOUNTER — Encounter: Payer: Self-pay | Admitting: Family Medicine

## 2019-07-25 MED ORDER — PREDNISONE 20 MG PO TABS: ORAL_TABLET | ORAL | 0 refills | Status: DC

## 2019-07-25 NOTE — Telephone Encounter (Signed)
See my chart message from today. Pt called back and wanted prednisone. Med sent to pharm per dr Nicki Reaper. [facility]. Pt was notified.

## 2019-07-25 NOTE — Telephone Encounter (Signed)
Nurses 1 option for the patient would be prednisone over the next 9 days Prednisone, 20 mg, #18 3qd for 3d then 2qd for 3d then 1qd for 3d If he does not want to do prednisone other option would be steroid cream

## 2019-07-26 ENCOUNTER — Ambulatory Visit (HOSPITAL_COMMUNITY): Payer: Medicare HMO | Admitting: Physical Therapy

## 2019-07-29 ENCOUNTER — Encounter (HOSPITAL_COMMUNITY): Payer: Medicare HMO | Admitting: Physical Therapy

## 2019-08-16 ENCOUNTER — Other Ambulatory Visit: Payer: Self-pay | Admitting: Family Medicine

## 2019-08-31 ENCOUNTER — Emergency Department (HOSPITAL_COMMUNITY): Payer: Medicare HMO

## 2019-08-31 ENCOUNTER — Emergency Department (HOSPITAL_COMMUNITY)
Admission: EM | Admit: 2019-08-31 | Discharge: 2019-08-31 | Disposition: A | Discharge status: Home / Self Care | Payer: Medicare HMO | Attending: Emergency Medicine | Admitting: Emergency Medicine

## 2019-08-31 ENCOUNTER — Other Ambulatory Visit: Payer: Self-pay

## 2019-08-31 ENCOUNTER — Encounter (HOSPITAL_COMMUNITY): Payer: Self-pay | Admitting: Emergency Medicine

## 2019-08-31 DIAGNOSIS — Y999 Unspecified external cause status: Secondary | ICD-10-CM | POA: Diagnosis not present

## 2019-08-31 DIAGNOSIS — W01198A Fall on same level from slipping, tripping and stumbling with subsequent striking against other object, initial encounter: Secondary | ICD-10-CM | POA: Insufficient documentation

## 2019-08-31 DIAGNOSIS — Y939 Activity, unspecified: Secondary | ICD-10-CM | POA: Diagnosis not present

## 2019-08-31 DIAGNOSIS — S6991XA Unspecified injury of right wrist, hand and finger(s), initial encounter: Secondary | ICD-10-CM | POA: Diagnosis not present

## 2019-08-31 DIAGNOSIS — F17210 Nicotine dependence, cigarettes, uncomplicated: Secondary | ICD-10-CM | POA: Diagnosis not present

## 2019-08-31 DIAGNOSIS — Y929 Unspecified place or not applicable: Secondary | ICD-10-CM | POA: Diagnosis not present

## 2019-08-31 DIAGNOSIS — S60221A Contusion of right hand, initial encounter: Secondary | ICD-10-CM | POA: Insufficient documentation

## 2019-08-31 DIAGNOSIS — S60921A Unspecified superficial injury of right hand, initial encounter: Secondary | ICD-10-CM | POA: Diagnosis present

## 2019-08-31 DIAGNOSIS — J449 Chronic obstructive pulmonary disease, unspecified: Secondary | ICD-10-CM | POA: Insufficient documentation

## 2019-08-31 DIAGNOSIS — R69 Illness, unspecified: Secondary | ICD-10-CM | POA: Diagnosis not present

## 2019-08-31 MED ORDER — IBUPROFEN 800 MG PO TABS: 800.0000 mg | ORAL_TABLET | Freq: Three times a day (TID) | ORAL | 0 refills | Status: DC

## 2019-08-31 NOTE — ED Provider Notes (Signed)
[facility] EMERGENCY DEPARTMENT Provider Note   CSN: [id number] Arrival date & time: 08/31/19  1525     History   Chief Complaint Chief Complaint  Patient presents with  . Hand Pain    HPI Tony Doyle is a 48 y.o. male presenting with pain and injury to his right fifth MCP region after tripping and falling yesterday.  He describes accidentally hitting the wall as he fell forward and has had pain and swelling since the event.  He denies weakness or numbness distal to the injury site.  He is right-handed.  He denies any wrist or forearm pain.  He has had no treatments prior to arrival.     The history is provided by the patient.    Past Medical History:  Diagnosis Date  . COPD (chronic obstructive pulmonary disease) ([place])   . Hypercholesterolemia   . Pneumonia     Patient Active Problem List   Diagnosis Date Noted  . Hyperlipidemia 12/13/2018  . Chest pain, musculoskeletal 02/22/2014  . Smoker 02/22/2014  . Foot contusion 08/23/2012    Past Surgical History:  Procedure Laterality Date  . arm surgery     LEFT  . LEG SURGERY     LEFT        Home Medications    Prior to Admission medications   Medication Sig Start Date End Date Taking? Authorizing Provider  atorvastatin (LIPITOR) 10 MG tablet TAKE 1 TABLET BY MOUTH EVERY DAY Patient taking differently: Take 10 mg by mouth every evening.  08/16/19  Yes Luking, Elayne Snare, MD  fluticasone (FLONASE) 50 MCG/ACT nasal spray Place 2 sprays into both nostrils daily. Patient taking differently: Place 2 sprays into both nostrils daily as needed for allergies.  04/14/19  Yes Kathyrn Drown, MD  ibuprofen (ADVIL) 800 MG tablet Take 1 tablet (800 mg total) by mouth 3 (three) times daily. 08/31/19   Evalee Jefferson, PA-C    Family History Family History  Problem Relation Age of Onset  . Asthma Father   . Emphysema Father        smoked  . Asthma Mother   . Emphysema Mother        smoked    Social History Social History    Tobacco Use  . Smoking status: Current Every Day Smoker    Packs/day: 0.50    Years: 27.00    Pack years: 13.50    Types: Cigarettes  . Smokeless tobacco: Never Used  Substance Use Topics  . Alcohol use: No  . Drug use: No     Allergies   Penicillins and Chantix [varenicline tartrate]   Review of Systems Review of Systems  Constitutional: Negative for fever.  Musculoskeletal: Positive for arthralgias and joint swelling. Negative for myalgias.  Neurological: Negative for weakness and numbness.     Physical Exam Updated Vital Signs BP 131/71 (BP Location: Right Arm)   Pulse 78   Temp 97.8 F (36.6 C) (Oral)   Resp 18   Ht 5\' 8"  (1.727 m)   Wt 97.5 kg   SpO2 97%   BMI 32.69 kg/m   Physical Exam Constitutional:      Appearance: He is well-developed.  HENT:     Head: Atraumatic.  Neck:     Musculoskeletal: Normal range of motion.  Cardiovascular:     Comments: Pulses equal bilaterally Musculoskeletal:        General: Swelling and tenderness present.       Hands:  Comments: Tender to palpation with mild edema at his right fifth MCP joint.  Distal sensation is intact with less than 2-second cap refill and fingertip.  There is no obvious palpable deformity.  Proximal hand, wrist and forearm is nontender.  Skin:    General: Skin is warm and dry.  Neurological:     Mental Status: He is alert.     Sensory: No sensory deficit.     Deep Tendon Reflexes: Reflexes normal.      ED Treatments / Results  Labs (all labs ordered are listed, but only abnormal results are displayed) Labs Reviewed - No data to display  EKG None  Radiology Dg Hand Complete Right  Result Date: 08/31/2019 CLINICAL DATA:  DIRECT BLOW TO 5TH MCP JOINT, RIGHT HAND HISTORY OF PNEUMONIA, COPD PER ER NOTE, Pt states that he fell going up some stairs he hurt the outside part of his right hand and fingers EXAM: RIGHT HAND - COMPLETE 3+ VIEW COMPARISON:  None. FINDINGS: There is no  evidence of fracture or dislocation. There is no evidence of arthropathy or other focal bone abnormality. Soft tissues are unremarkable. IMPRESSION: No acute osseous abnormality in the right hand. Electronically Signed   By: Emmaline Kluver M.D.   On: 08/31/2019 18:12    Procedures Procedures (including critical care time)  Medications Ordered in ED Medications - No data to display   Initial Impression / Assessment and Plan / ED Course  I have reviewed the triage vital signs and the nursing notes.  Pertinent labs & imaging results that were available during my care of the patient were reviewed by me and considered in my medical decision making (see chart for details).        Imaging reviewed, discussed and shown patient.  Suspect contusion of right fifth metacarpal.  The MCP joint appears stable, doubt ligament injury.  Distal sensation is intact.  Discussed home therapy including ice, ibuprofen.  PRN follow-up with PCP if symptoms are not improving over the next 10 to 14 days.  Final Clinical Impressions(s) / ED Diagnoses   Final diagnoses:  Contusion of right hand, initial encounter    ED Discharge Orders         Ordered    ibuprofen (ADVIL) 800 MG tablet  3 times daily     08/31/19 1839           Burgess Amor, PA-C 08/31/19 1901    Samuel Jester, DO 09/01/19 1612

## 2019-08-31 NOTE — ED Triage Notes (Signed)
Pt states that he fell going up some stairs he hurt the outside part of his right hand and fingers

## 2019-08-31 NOTE — Discharge Instructions (Signed)
Your x-rays are negative for fracture or dislocation in your hand.  Use the medication prescribed for pain and inflammation.  Ice and elevation can also help with pain relief as well.  Plan to see your doctor for recheck if symptoms persist or worsen beyond the next 10 to 14 days.  Will probably take some time for this injury to heal.

## 2019-09-13 ENCOUNTER — Other Ambulatory Visit: Payer: Self-pay

## 2019-09-13 ENCOUNTER — Other Ambulatory Visit (INDEPENDENT_AMBULATORY_CARE_PROVIDER_SITE_OTHER): Payer: Medicare HMO | Admitting: *Deleted

## 2019-09-13 DIAGNOSIS — Z23 Encounter for immunization: Secondary | ICD-10-CM

## 2019-10-13 ENCOUNTER — Other Ambulatory Visit: Payer: Self-pay | Admitting: Family Medicine

## 2019-10-14 NOTE — Telephone Encounter (Signed)
90-day refill is okay definitely do a follow-up within 3 months

## 2020-01-14 ENCOUNTER — Other Ambulatory Visit: Payer: Self-pay | Admitting: Family Medicine

## 2020-01-17 NOTE — Telephone Encounter (Signed)
May have 30-day needs to do office visit or virtual visit

## 2020-01-17 NOTE — Telephone Encounter (Signed)
Scheduled 3/2

## 2020-02-03 DIAGNOSIS — R030 Elevated blood-pressure reading, without diagnosis of hypertension: Secondary | ICD-10-CM | POA: Diagnosis not present

## 2020-02-03 DIAGNOSIS — J449 Chronic obstructive pulmonary disease, unspecified: Secondary | ICD-10-CM | POA: Diagnosis not present

## 2020-02-03 DIAGNOSIS — E785 Hyperlipidemia, unspecified: Secondary | ICD-10-CM | POA: Diagnosis not present

## 2020-02-03 DIAGNOSIS — Z880 Allergy status to penicillin: Secondary | ICD-10-CM | POA: Diagnosis not present

## 2020-02-03 DIAGNOSIS — E669 Obesity, unspecified: Secondary | ICD-10-CM | POA: Diagnosis not present

## 2020-02-03 DIAGNOSIS — R69 Illness, unspecified: Secondary | ICD-10-CM | POA: Diagnosis not present

## 2020-02-07 ENCOUNTER — Other Ambulatory Visit: Payer: Self-pay

## 2020-02-07 ENCOUNTER — Ambulatory Visit (INDEPENDENT_AMBULATORY_CARE_PROVIDER_SITE_OTHER): Payer: Medicare HMO | Admitting: Family Medicine

## 2020-02-07 ENCOUNTER — Ambulatory Visit: Payer: Medicare HMO | Admitting: Family Medicine

## 2020-02-07 DIAGNOSIS — Z79899 Other long term (current) drug therapy: Secondary | ICD-10-CM

## 2020-02-07 DIAGNOSIS — Z125 Encounter for screening for malignant neoplasm of prostate: Secondary | ICD-10-CM | POA: Diagnosis not present

## 2020-02-07 DIAGNOSIS — Z1211 Encounter for screening for malignant neoplasm of colon: Secondary | ICD-10-CM

## 2020-02-07 DIAGNOSIS — E7849 Other hyperlipidemia: Secondary | ICD-10-CM

## 2020-02-07 MED ORDER — ATORVASTATIN CALCIUM 10 MG PO TABS: 10.0000 mg | ORAL_TABLET | Freq: Every day | ORAL | 1 refills | Status: DC

## 2020-02-07 NOTE — Progress Notes (Signed)
   Subjective:    Patient ID: Tony Doyle, male    DOB: Mar 26, 1971, 49 y.o.   MRN: [id number]  HPI Pt here for follow up. Pt states he is doing well. Taking medications as prescribed. No issues or concerns Patient overall states he is doing well taking his medicine watching diet trying to stay active.  Denies high fever chills sweats nausea vomiting diarrhea chest tightness pressure pain or shortness of breath Virtual Visit via Video Note  I connected with Tony Doyle on 02/07/20 at  1:40 PM EST by a video enabled telemedicine application and verified that I am speaking with the correct person using two identifiers.  Location: Patient: home Provider: office   I discussed the limitations of evaluation and management by telemedicine and the availability of in person appointments. The patient expressed understanding and agreed to proceed.  History of Present Illness:    Observations/Objective:   Assessment and Plan:   Follow Up Instructions:    I discussed the assessment and treatment plan with the patient. The patient was provided an opportunity to ask questions and all were answered. The patient agreed with the plan and demonstrated an understanding of the instructions.   The patient was advised to call back or seek an in-person evaluation if the symptoms worsen or if the condition fails to improve as anticipated.  I provided 18 minutes of non-face-to-face time during this encounter.       Review of Systems  Constitutional: Negative for activity change.  HENT: Negative for congestion and rhinorrhea.   Respiratory: Negative for cough and shortness of breath.   Cardiovascular: Negative for chest pain.  Gastrointestinal: Negative for abdominal pain, diarrhea, nausea and vomiting.  Genitourinary: Negative for dysuria and hematuria.  Neurological: Negative for weakness and headaches.  Psychiatric/Behavioral: Negative for behavioral problems and confusion.         Objective:   Physical Exam   Patient had virtual visit Appears to be in no distress Atraumatic Neuro able to relate and oriented No apparent resp distress Color normal      Assessment & Plan:  Hyperlipidemia he does take his medicine watches diet stays active  Patient will be doing colonoscopy sometime this summer we will put in a referral for this  Wellness visit by this summer in person

## 2020-02-07 NOTE — Progress Notes (Signed)
Referral to Dr.Rourke for colonoscopy  placed in Epic

## 2020-02-14 ENCOUNTER — Encounter: Payer: Self-pay | Admitting: Family Medicine

## 2020-02-15 ENCOUNTER — Emergency Department (HOSPITAL_COMMUNITY)
Admission: EM | Admit: 2020-02-15 | Discharge: 2020-02-15 | Disposition: A | Discharge status: Home / Self Care | Payer: Medicare HMO | Attending: Emergency Medicine | Admitting: Emergency Medicine

## 2020-02-15 ENCOUNTER — Emergency Department (HOSPITAL_COMMUNITY): Payer: Medicare HMO

## 2020-02-15 ENCOUNTER — Encounter (HOSPITAL_COMMUNITY): Payer: Self-pay | Admitting: *Deleted

## 2020-02-15 ENCOUNTER — Encounter: Payer: Self-pay | Admitting: Internal Medicine

## 2020-02-15 ENCOUNTER — Other Ambulatory Visit: Payer: Self-pay

## 2020-02-15 DIAGNOSIS — F17210 Nicotine dependence, cigarettes, uncomplicated: Secondary | ICD-10-CM | POA: Diagnosis not present

## 2020-02-15 DIAGNOSIS — Z79899 Other long term (current) drug therapy: Secondary | ICD-10-CM | POA: Insufficient documentation

## 2020-02-15 DIAGNOSIS — R0789 Other chest pain: Secondary | ICD-10-CM | POA: Insufficient documentation

## 2020-02-15 DIAGNOSIS — J449 Chronic obstructive pulmonary disease, unspecified: Secondary | ICD-10-CM | POA: Diagnosis not present

## 2020-02-15 DIAGNOSIS — R69 Illness, unspecified: Secondary | ICD-10-CM | POA: Diagnosis not present

## 2020-02-15 DIAGNOSIS — R079 Chest pain, unspecified: Secondary | ICD-10-CM | POA: Diagnosis not present

## 2020-02-15 LAB — BASIC METABOLIC PANEL
Anion gap: 10 (ref 5–15)
BUN: 10 mg/dL (ref 6–20)
CO2: 24 mmol/L (ref 22–32)
Calcium: 9.4 mg/dL (ref 8.9–10.3)
Chloride: 105 mmol/L (ref 98–111)
Creatinine, Ser: 0.9 mg/dL (ref 0.61–1.24)
GFR calc Af Amer: >60 mL/min (ref >60)
GFR calc non Af Amer: >60 mL/min (ref >60)
Glucose, Bld: 127 mg/dL — ABNORMAL HIGH (ref 70–99)
Potassium: 3.8 mmol/L (ref 3.5–5.1)
Sodium: 139 mmol/L (ref 135–145)

## 2020-02-15 LAB — CBC
HCT: 47.9 % (ref 39.0–52.0)
Hemoglobin: 15.8 g/dL (ref 13.0–17.0)
MCH: 29.2 pg (ref 26.0–34.0)
MCHC: 33 g/dL (ref 30.0–36.0)
MCV: 88.4 fL (ref 80.0–100.0)
Platelets: 191 10*3/uL (ref 150–400)
RBC: 5.42 MIL/uL (ref 4.22–5.81)
RDW: 13.2 % (ref 11.5–15.5)
WBC: 6.8 10*3/uL (ref 4.0–10.5)
nRBC: 0 % (ref 0.0–0.2)

## 2020-02-15 LAB — TROPONIN I (HIGH SENSITIVITY)
Troponin I (High Sensitivity): 3 ng/L (ref <18)
Troponin I (High Sensitivity): <2 ng/L (ref <18)

## 2020-02-15 MED ORDER — SODIUM CHLORIDE 0.9% FLUSH: 3.0000 mL | Freq: Once | INTRAVENOUS | Status: DC

## 2020-02-15 MED ORDER — KETOROLAC TROMETHAMINE 30 MG/ML IJ SOLN
30.0000 mg | Freq: Once | INTRAMUSCULAR | Status: AC
  Administered 2020-02-15: 30 mg via INTRAVENOUS
  Filled 2020-02-15: qty 1

## 2020-02-15 NOTE — ED Provider Notes (Signed)
[facility] EMERGENCY DEPARTMENT Provider Note   CSN: [id number] Arrival date & time: 02/15/20  8588   History Chief Complaint  Patient presents with  . Chest Pain    Tony Doyle is a 49 y.o. male.  The history is provided by the patient.  Chest Pain He has history of hyperlipidemia, COPD and comes in with left-sided chest pain for the last 3 days which got worse today.  Pain has been constant and is worse with movement and deep breathing and coughing.  He has had a cough productive of some thick, white sputum.  He denies fever or chills.  Denies dyspnea, nausea, diaphoresis.  He did take acetaminophen for pain without relief.  Pain is rated at 10/10.  He does smoke half pack of cigarettes a day.  He denies history of hypertension, diabetes and denies family history of coronary artery disease.  Past Medical History:  Diagnosis Date  . COPD (chronic obstructive pulmonary disease) (HCC)   . Hypercholesterolemia   . Pneumonia     Patient Active Problem List   Diagnosis Date Noted  . Hyperlipidemia 12/13/2018  . Chest pain, musculoskeletal 02/22/2014  . Smoker 02/22/2014  . Foot contusion 08/23/2012    Past Surgical History:  Procedure Laterality Date  . arm surgery     LEFT  . LEG SURGERY     LEFT       Family History  Problem Relation Age of Onset  . Asthma Father   . Emphysema Father        smoked  . Asthma Mother   . Emphysema Mother        smoked    Social History   Tobacco Use  . Smoking status: Current Every Day Smoker    Packs/day: 0.50    Years: 27.00    Pack years: 13.50    Types: Cigarettes  . Smokeless tobacco: Never Used  Substance Use Topics  . Alcohol use: No  . Drug use: No    Home Medications Prior to Admission medications   Medication Sig Start Date End Date Taking? Authorizing Provider  atorvastatin (LIPITOR) 10 MG tablet Take 1 tablet (10 mg total) by mouth daily. 02/07/20   Babs Sciara, MD  fluticasone (FLONASE) 50 MCG/ACT  nasal spray Place 2 sprays into both nostrils daily. Patient taking differently: Place 2 sprays into both nostrils daily as needed for allergies.  04/14/19   Babs Sciara, MD  ibuprofen (ADVIL) 800 MG tablet Take 1 tablet (800 mg total) by mouth 3 (three) times daily. 08/31/19   Burgess Amor, PA-C    Allergies    Penicillins and Chantix [varenicline tartrate]  Review of Systems   Review of Systems  Cardiovascular: Positive for chest pain.  All other systems reviewed and are negative.   Physical Exam Updated Vital Signs BP 117/67   Pulse (!) 52   Resp 19   Ht 5\' 7"  (1.702 m)   Wt 97.5 kg   SpO2 98%   BMI 33.67 kg/m   Physical Exam Vitals and nursing note reviewed.   49 year old male, resting comfortably and in no acute distress. Vital signs are significant for slightly slow heart rate. Oxygen saturation is 98%, which is normal. Head is normocephalic and atraumatic. PERRLA, EOMI. Oropharynx is clear. Neck is nontender and supple without adenopathy or JVD. Back is nontender and there is no CVA tenderness. Lungs are clear without rales, wheezes, or rhonchi. Chest has mild tenderness in the left mid  chest.  There is no crepitus. Heart has regular rate and rhythm without murmur. Abdomen is soft, flat, nontender without masses or hepatosplenomegaly and peristalsis is normoactive. Extremities have no cyanosis or edema, full range of motion is present. Skin is warm and dry without rash. Neurologic: Mental status is normal, cranial nerves are intact, there are no motor or sensory deficits.  ED Results / Procedures / Treatments   Labs (all labs ordered are listed, but only abnormal results are displayed) Labs Reviewed  BASIC METABOLIC PANEL - Abnormal; Notable for the following components:      Result Value   Glucose, Bld 127 (*)    All other components within normal limits  CBC  TROPONIN I (HIGH SENSITIVITY)    EKG EKG Interpretation  Date/Time:  Wednesday February 15 2020  06:15:08 EST Ventricular Rate:  59 PR Interval:    QRS Duration: 118 QT Interval:  434 QTC Calculation: 430 R Axis:   69 Text Interpretation: Sinus rhythm Short PR interval Consider right atrial enlargement Nonspecific intraventricular conduction delay Minimal ST elevation, anterolateral leads Baseline wander in lead(s) V4 Artifact When compared with ECG of 10/27/2016, No significant change was found Confirmed by Delora Fuel ([id number]) on 02/15/2020 6:18:26 AM   Radiology DG Chest Portable 1 View  Result Date: 02/15/2020 CLINICAL DATA:  Chest pain. EXAM: PORTABLE CHEST 1 VIEW COMPARISON:  11/30/2017 FINDINGS: The cardiac silhouette, mediastinal and hilar contours are within normal limits and stable. Mild bronchitic type changes likely related to smoking. No acute pulmonary findings or worrisome pulmonary lesions. IMPRESSION: Mild bronchitic type lung changes likely related to smoking. No acute overlying pulmonary process. Electronically Signed   By: Marijo Sanes M.D.   On: 02/15/2020 06:29    Procedures Procedures  Medications Ordered in ED Medications  sodium chloride flush (NS) 0.9 % injection 3 mL (3 mLs Intravenous Not Given 02/15/20 0620)  ketorolac (TORADOL) 30 MG/ML injection 30 mg (has no administration in time range)    ED Course  I have reviewed the triage vital signs and the nursing notes.  Pertinent labs & imaging results that were available during my care of the patient were reviewed by me and considered in my medical decision making (see chart for details).  MDM Rules/Calculators/A&P Chest pain which is seems to be chest wall pain based on history and physical.  No risk factors for pulmonary embolism, chest x-ray shows no evidence of pneumonia.  ECG shows no acute changes.  Heart score is 2 which puts him at low risk for major adverse cardiac events in the next 6 weeks.  Troponin is pending.  He will be given a therapeutic trial of ketorolac.  Old records are reviewed, and  he does have prior ED visits for chest wall pain.    Initial troponin is normal, delta troponin pending.  Case is signed out to Dr. Melina Copa.  Final Clinical Impression(s) / ED Diagnoses Final diagnoses:  Chest wall pain    Rx / DC Orders ED Discharge Orders    None       Delora Fuel, MD 02/40/97 [phone]

## 2020-02-15 NOTE — ED Triage Notes (Signed)
Pt c/o left side chest pain that has gotten progressively worse over the last couple of days; pt denies any radiating pain

## 2020-02-15 NOTE — ED Provider Notes (Signed)
Signout from Dr. Preston Fleeting.  49 year old male here with left-sided chest pain x3 days worse tonight.  Initial troponin okay.  Plan is to follow-up on delta troponin and if not significantly changed patient to be discharged to follow-up outpatient with his primary care doctor. Physical Exam  BP 125/71   Pulse (!) 50   Resp (!) 24   Ht 5\' 7"  (1.702 m)   Wt 97.5 kg   SpO2 97%   BMI 33.67 kg/m   Physical Exam  ED Course/Procedures     Procedures  MDM  Second troponin back and negative.  Patient states his pain is improved.  Reviewed all work-up with him and he is comfortable with discharge.  Understands to return if any worsening symptoms.      , MD 02/15/20 [phone]

## 2020-02-16 ENCOUNTER — Ambulatory Visit (INDEPENDENT_AMBULATORY_CARE_PROVIDER_SITE_OTHER): Payer: Medicare HMO | Admitting: Family Medicine

## 2020-02-16 DIAGNOSIS — R0789 Other chest pain: Secondary | ICD-10-CM

## 2020-02-16 MED ORDER — HYDROCODONE-ACETAMINOPHEN 7.5-325 MG PO TABS: ORAL_TABLET | ORAL | 0 refills | Status: DC

## 2020-02-16 MED ORDER — NAPROXEN 500 MG PO TABS: ORAL_TABLET | ORAL | 0 refills | Status: DC

## 2020-02-16 NOTE — Progress Notes (Signed)
   Subjective:    Patient ID: Tony Doyle, male    DOB: [birth year], 49 y.o.   MRN: [id number]  HPI Pt was seen yesterday morning at [facility] ER for chest wall pain. Pt began feel some chest tightness a couple days ago. Pain on left hand side, stops about middle ways of chest. No shortness of breath, no trouble breathing, no numbness/tinglining. Pt was given pain med via IV. Ran labs and EKG. No meds prescribed. Pt has been taking Tylenol and Motrin, no relief. Pt not being able to sleep at night. When pt cough, pt states that if feels like a knife going in to side along with a burning feeling. Pt is coughing up some clear phelghm.  Significant sharp pressure sharp pain hurts to sneeze cough rollover denies any substernal discomfort ER did troponin negative EKG looks good chest x-ray portable look good Virtual Visit via Video Note  I connected with Tony Doyle on 02/16/20 at  9:00 AM EST by a video enabled telemedicine application and verified that I am speaking with the correct person using two identifiers.  Location: Patient: home Provider: office   I discussed the limitations of evaluation and management by telemedicine and the availability of in person appointments. The patient expressed understanding and agreed to proceed.  History of Present Illness:    Observations/Objective:   Assessment and Plan:   Follow Up Instructions:    I discussed the assessment and treatment plan with the patient. The patient was provided an opportunity to ask questions and all were answered. The patient agreed with the plan and demonstrated an understanding of the instructions.   The patient was advised to call back or seek an in-person evaluation if the symptoms worsen or if the condition fails to improve as anticipated.  I provided 20 minutes of non-face-to-face time during this encounter.      Review of Systems  Constitutional: Negative for activity change, chills and fever.  HENT:  Negative for congestion, ear pain and rhinorrhea.   Eyes: Negative for discharge.  Respiratory: Positive for cough. Negative for wheezing.   Cardiovascular: Negative for chest pain.  Gastrointestinal: Negative for nausea and vomiting.  Musculoskeletal: Negative for arthralgias.       Objective:   Physical Exam  Patient had virtual visit Appears to be in no distress Atraumatic Neuro able to relate and oriented No apparent resp distress Color normal       Assessment & Plan:  I doubt pulmonary embolism Doubt heart disease More likely pleuritic pain or chest wall pain May use pain medication in the evening as necessary caution drowsiness If progressive troubles or problems to notify us in the follow-up Warning signs were discussed in detail

## 2020-03-02 ENCOUNTER — Other Ambulatory Visit: Payer: Self-pay

## 2020-03-02 ENCOUNTER — Other Ambulatory Visit (HOSPITAL_COMMUNITY)
Admission: RE | Admit: 2020-03-02 | Discharge: 2020-03-02 | Disposition: A | Discharge status: Home / Self Care | Payer: Medicare HMO | Source: Ambulatory Visit | Attending: Family Medicine | Admitting: Family Medicine

## 2020-03-02 DIAGNOSIS — Z125 Encounter for screening for malignant neoplasm of prostate: Secondary | ICD-10-CM | POA: Diagnosis not present

## 2020-03-02 DIAGNOSIS — Z79899 Other long term (current) drug therapy: Secondary | ICD-10-CM | POA: Diagnosis not present

## 2020-03-02 DIAGNOSIS — E7849 Other hyperlipidemia: Secondary | ICD-10-CM | POA: Insufficient documentation

## 2020-03-02 LAB — LIPID PANEL
Cholesterol: 133 mg/dL (ref 0–200)
HDL: 28 mg/dL — ABNORMAL LOW (ref >40)
LDL Cholesterol: 78 mg/dL (ref 0–99)
Total CHOL/HDL Ratio: 4.8 RATIO
Triglycerides: 134 mg/dL (ref <150)
VLDL: 27 mg/dL (ref 0–40)

## 2020-03-02 LAB — HEPATIC FUNCTION PANEL
ALT: 33 U/L (ref 0–44)
AST: 24 U/L (ref 15–41)
Albumin: 3.9 g/dL (ref 3.5–5.0)
Alkaline Phosphatase: 97 U/L (ref 38–126)
Bilirubin, Direct: <0.1 mg/dL (ref 0.0–0.2)
Total Bilirubin: 0.4 mg/dL (ref 0.3–1.2)
Total Protein: 7.3 g/dL (ref 6.5–8.1)

## 2020-03-02 LAB — BASIC METABOLIC PANEL
Anion gap: 7 (ref 5–15)
BUN: 9 mg/dL (ref 6–20)
CO2: 24 mmol/L (ref 22–32)
Calcium: 8.8 mg/dL — ABNORMAL LOW (ref 8.9–10.3)
Chloride: 107 mmol/L (ref 98–111)
Creatinine, Ser: 0.81 mg/dL (ref 0.61–1.24)
GFR calc Af Amer: >60 mL/min (ref >60)
GFR calc non Af Amer: >60 mL/min (ref >60)
Glucose, Bld: 114 mg/dL — ABNORMAL HIGH (ref 70–99)
Potassium: 4.1 mmol/L (ref 3.5–5.1)
Sodium: 138 mmol/L (ref 135–145)

## 2020-03-02 LAB — PSA: Prostatic Specific Antigen: 0.33 ng/mL (ref 0.00–4.00)

## 2020-03-09 IMAGING — MR MRI LUMBAR SPINE WITHOUT CONTRAST
4 of 5 series · 15 of 48 positions shown · non-contrast
Comparison: None.

CLINICAL DATA: Pain and weakness of the right arm for 2-3 weeks.
Right leg is on fire for 6-7 months

EXAM:
MRI LUMBAR SPINE WITHOUT CONTRAST
TECHNIQUE: Multiplanar, multisequence MR imaging of the lumbar spine was
performed. No intravenous contrast was administered.

[Series 3: T2 · sagittal · 4.0mm · 0.66mm/px · 5 of 15 slices shown (1 of 2)]
[im 1/15]
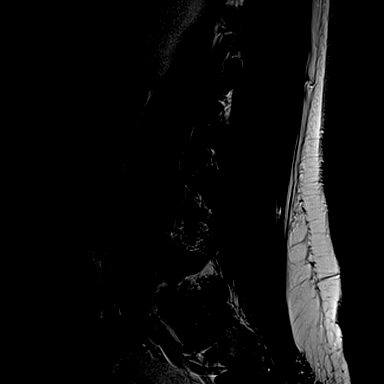
[im 4/15]
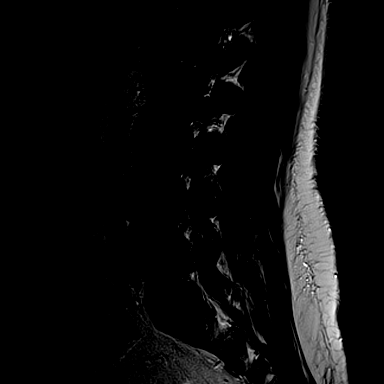
[im 8/15]
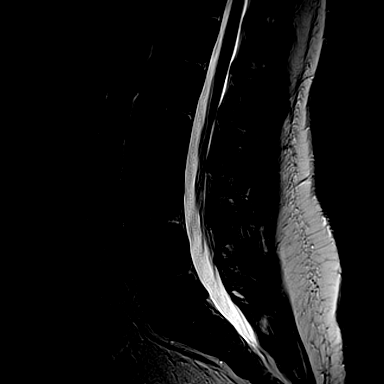
[im 11/15]
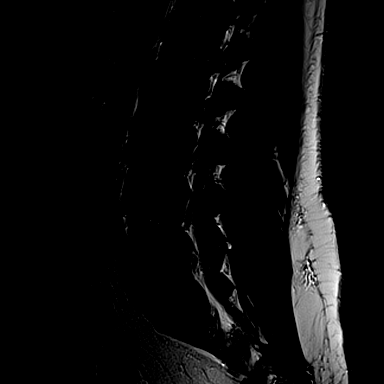
[im 15/15]
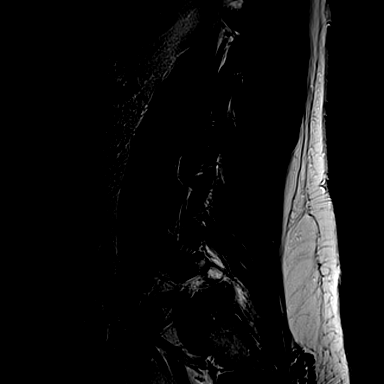

[Series 4: T1 · sagittal · 4.0mm · 0.33mm/px · 3 of 15 slices shown (1 of 2)]
[im 1/15]
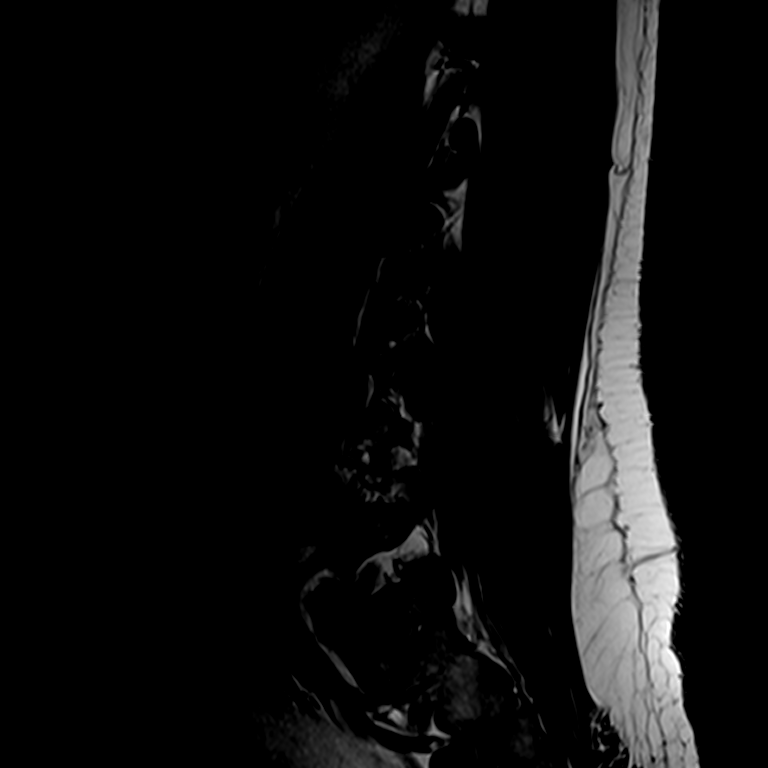
[im 8/15]
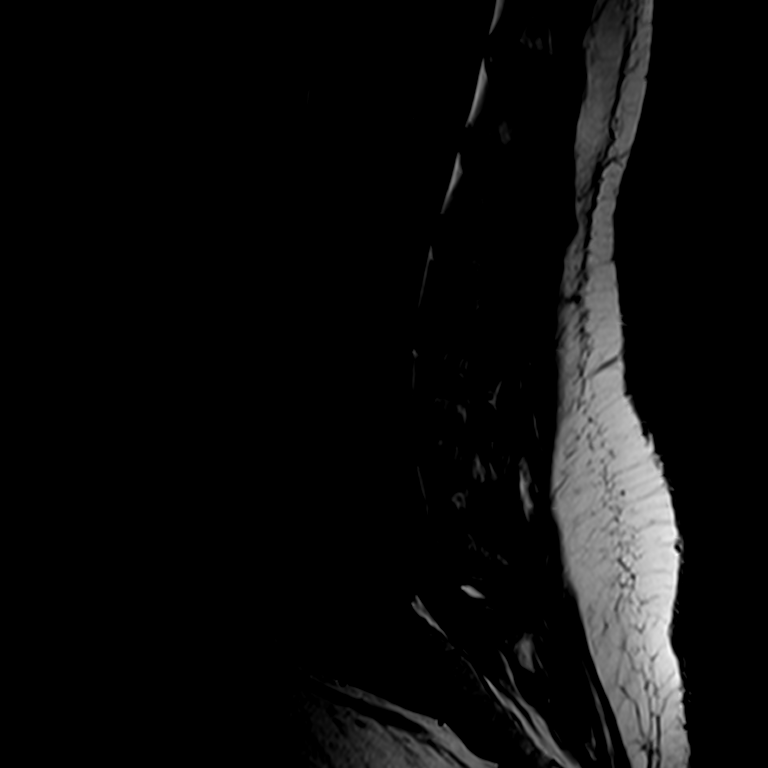
[im 15/15]
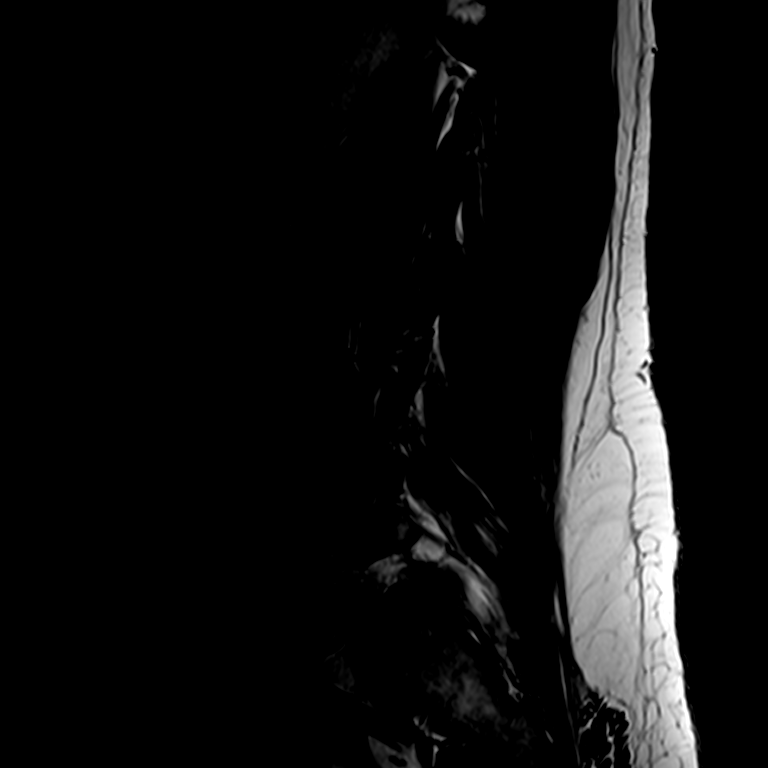

[Series 6: T1 · axial · 4.0mm · 0.25mm/px · z∈[-80,+68]mm · 3 of 42 slices shown (2 of 2)]
[im 6/42]
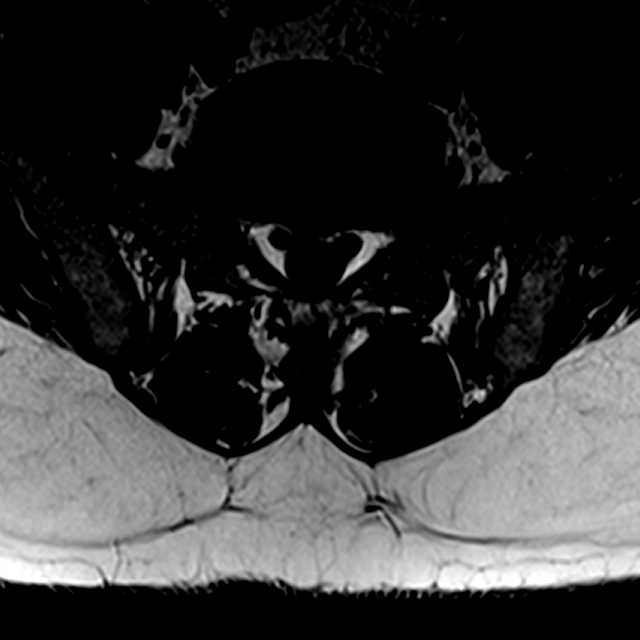
[im 22/42]
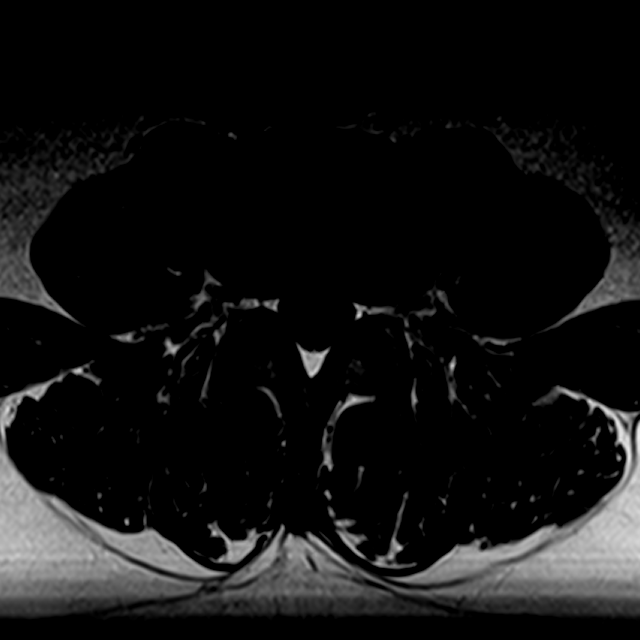
[im 36/42]
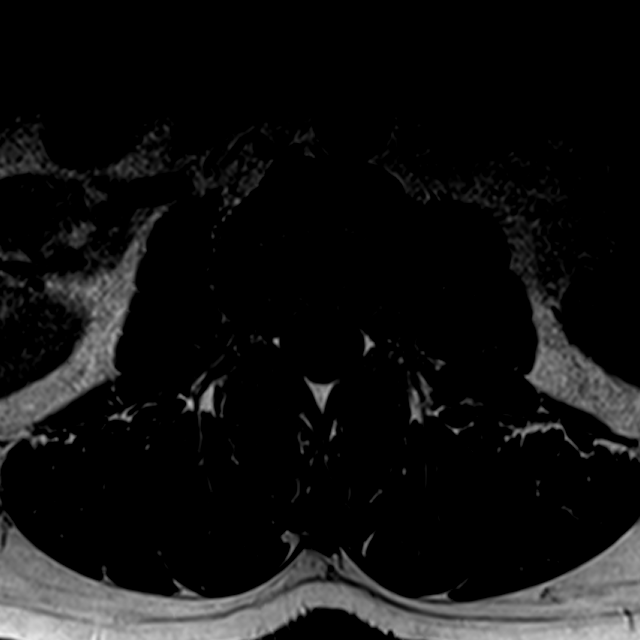

[Series 7: T2 · axial · 4.0mm · 0.25mm/px · z∈[-93,+68]mm · 4 of 42 slices shown (2 of 2)]
[im 3/42]
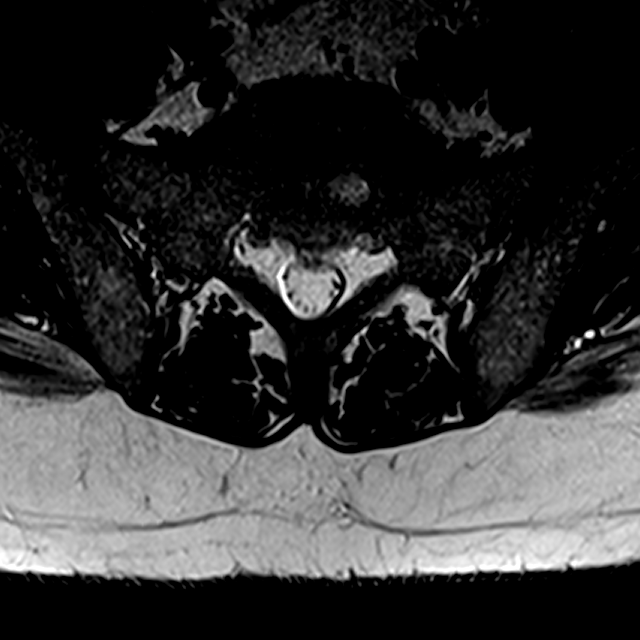
[im 6/42]
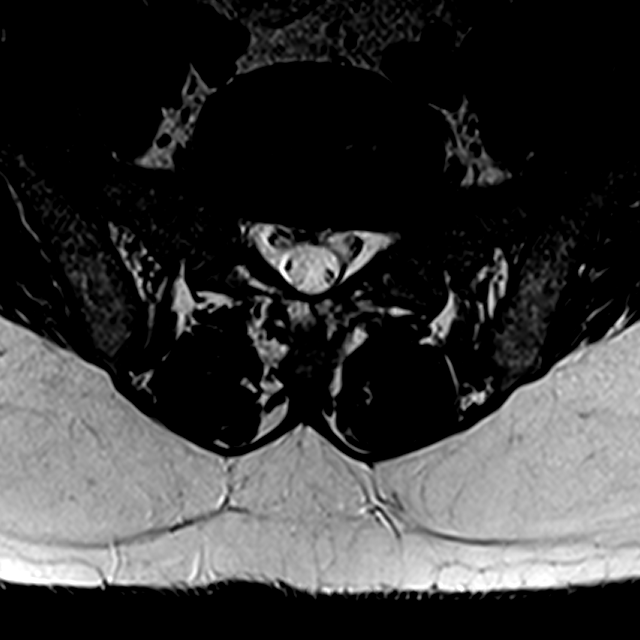
[im 22/42]
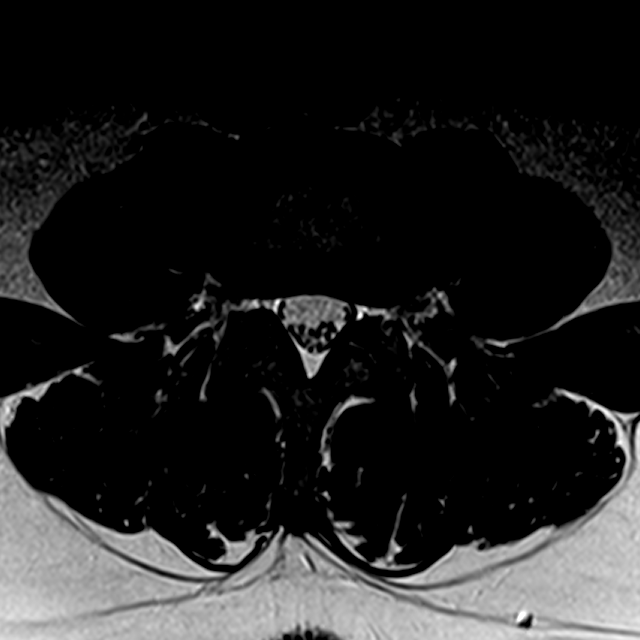
[im 36/42]
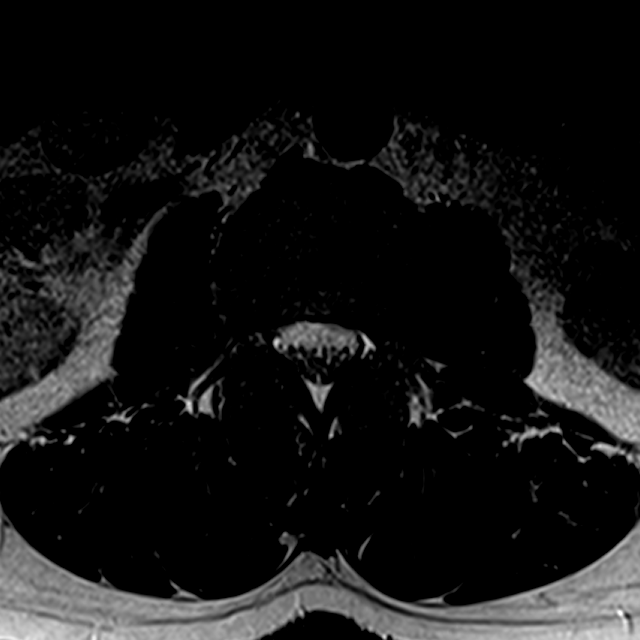

[15 of 48 positions shown; findings below may reference images not displayed]

FINDINGS: Segmentation:  Standard.

Alignment:  Physiologic.

Vertebrae:  No fracture, evidence of discitis, or bone lesion.

Conus medullaris and cauda equina: Conus extends to the L1-2 level.
Conus and cauda equina appear normal.

Paraspinal and other soft tissues: No acute paraspinal abnormality.

Disc levels:

Disc spaces: Disc desiccation at L5-S1. Remainder the disc heights
are maintained.

T12-L1: No significant disc bulge. No evidence of neural foraminal
stenosis. No central canal stenosis.

L1-L2: No significant disc bulge. No evidence of neural foraminal
stenosis. No central canal stenosis.

L2-L3: No significant disc bulge. No evidence of neural foraminal
stenosis. No central canal stenosis.

L3-L4: No significant disc bulge. No evidence of neural foraminal
stenosis. No central canal stenosis.

L4-L5: No significant disc bulge. No evidence of neural foraminal
stenosis. No central canal stenosis.

L5-S1: Small central disc protrusion at L5-S1. No evidence of neural
foraminal stenosis. No central canal stenosis.
IMPRESSION: 1. At L5-S1 there is a small central disc protrusion.

## 2020-03-21 ENCOUNTER — Ambulatory Visit: Payer: Medicare HMO | Admitting: Allergy & Immunology

## 2020-03-29 ENCOUNTER — Telehealth: Payer: Self-pay | Admitting: Family Medicine

## 2020-03-29 MED ORDER — PREDNISONE 20 MG PO TABS: ORAL_TABLET | ORAL | 0 refills | Status: DC

## 2020-03-29 NOTE — Telephone Encounter (Signed)
Medication sent in and patient is aware. Pt verbalized understanding.  

## 2020-03-29 NOTE — Telephone Encounter (Signed)
Pt states he was cleaning out some flower beds the other day and got poison oak on chest arms and legs. Pt states he has tried Calamine lotion but that has not helped. Pt states no pain just itches really bad. No trouble breathing, no swelling, no shortness of breath. Please advise. Thank you

## 2020-03-29 NOTE — Telephone Encounter (Signed)
Pt is having poison oak on his arms and now on his chest and legs. He is hoping something can be sent to pharmacy.  [id number] - [place], [place] - [address] AT [facility]

## 2020-03-29 NOTE — Telephone Encounter (Signed)
Talk with patient please Typically we use prednisone If he is fine with that prednisone 20 mg, #18,3qd for 3d then 2qd for 3d then 1qd for 3d He may take Benadryl when at home for itching Typically prednisone does better than creams

## 2020-03-30 ENCOUNTER — Ambulatory Visit: Payer: Medicare HMO | Admitting: Allergy & Immunology

## 2020-04-12 ENCOUNTER — Ambulatory Visit: Payer: Medicare HMO

## 2020-04-27 ENCOUNTER — Ambulatory Visit (INDEPENDENT_AMBULATORY_CARE_PROVIDER_SITE_OTHER): Payer: Medicare HMO | Admitting: Allergy & Immunology

## 2020-04-27 ENCOUNTER — Encounter: Payer: Self-pay | Admitting: Allergy & Immunology

## 2020-04-27 ENCOUNTER — Other Ambulatory Visit: Payer: Self-pay | Admitting: *Deleted

## 2020-04-27 ENCOUNTER — Other Ambulatory Visit: Payer: Self-pay | Admitting: Allergy & Immunology

## 2020-04-27 ENCOUNTER — Other Ambulatory Visit: Payer: Self-pay

## 2020-04-27 VITALS — BP 132/82 | HR 72 | Temp 97.8°F | Resp 18 | Ht 69.8 in | Wt 223.2 lb

## 2020-04-27 DIAGNOSIS — J45909 Unspecified asthma, uncomplicated: Secondary | ICD-10-CM | POA: Diagnosis not present

## 2020-04-27 DIAGNOSIS — J310 Chronic rhinitis: Secondary | ICD-10-CM

## 2020-04-27 DIAGNOSIS — J4540 Moderate persistent asthma, uncomplicated: Secondary | ICD-10-CM

## 2020-04-27 MED ORDER — BUDESONIDE-FORMOTEROL FUMARATE 160-4.5 MCG/ACT IN AERO: 2.0000 | INHALATION_SPRAY | Freq: Two times a day (BID) | RESPIRATORY_TRACT | 5 refills | Status: DC

## 2020-04-27 MED ORDER — ALBUTEROL SULFATE HFA 108 (90 BASE) MCG/ACT IN AERS: 2.0000 | INHALATION_SPRAY | RESPIRATORY_TRACT | 1 refills | Status: DC | PRN

## 2020-04-27 MED ORDER — ALBUTEROL SULFATE HFA 108 (90 BASE) MCG/ACT IN AERS: 2.0000 | INHALATION_SPRAY | Freq: Four times a day (QID) | RESPIRATORY_TRACT | 2 refills | Status: DC | PRN

## 2020-04-27 MED ORDER — CETIRIZINE HCL 10 MG PO TABS: 10.0000 mg | ORAL_TABLET | Freq: Every day | ORAL | 5 refills | Status: DC

## 2020-04-27 NOTE — Progress Notes (Signed)
NEW PATIENT  Date of Service/Encounter:  04/27/20  Referring provider: Babs Sciara, MD   Assessment:   Moderate persistent asthma, uncomplicated  Chronic rhinitis   Plan/Recommendations:   1. Moderate persistent asthma, uncomplicated - Lung testing looked terrible today, but it did improve with the albuterol treatments.  - I think you need to take something on a daily basis for the best management of your symptoms.  - Stopping smoking would also help with your breathing, as I am sure you know.  - Spacer sample and demonstration provided. - Daily controller medication(s): Symbicort 160/4.11mcg two puffs twice daily with spacer - Prior to physical activity: albuterol 2 puffs 10-15 minutes before physical activity. - Rescue medications: albuterol 4 puffs every 4-6 hours as needed - Asthma control goals:  * Full participation in all desired activities (may need albuterol before activity) * Albuterol use two time or less a week on average (not counting use with activity) * Cough interfering with sleep two time or less a month * Oral steroids no more than once a year * No hospitalizations  2. Chronic rhinitis - Testing today showed: negative to the entire panel  - This might be due to your smoking, which can cause nasal congestion and allergy-like symptoms.  - Alternatively, this may be what is known as "local allergic rhinitis", which occurs when your immune system makes localized IgE (allergy antibodies) to various allergens (as opposed to systemic) IgE - Therefore, since IgE is not make systemically, allergic sensitizations are not picked up on routine skin testing. - There are specialized academic research centers that can test for IgE in the nasal cavity, but this is not done in the clinical setting at this time.  - It can still be treated with nasal sprays and antihistamines.  - Copy of test results provided.  - Avoidance measures provided. - Continue with: Flonase  (fluticasone) one spray per nostril daily (but use EVERY DAY for the best effect) - Start taking: Zyrtec (cetirizine) 10mg  tablet once daily (DURING WORST TIMES OF THE YEAR ONLY) - You can use an extra dose of the antihistamine, if needed, for breakthrough symptoms.  - Consider nasal saline rinses 1-2 times daily to remove allergens from the nasal cavities as well as help with mucous clearance (this is especially helpful to do before the nasal sprays are given)  3. Return in about 4 weeks (around 05/25/2020). This can be an in-person, a virtual Webex or a telephone follow up visit.  Subjective:   Tony Doyle is a 49 y.o. male presenting today for evaluation of  Chief Complaint  Patient presents with  . Allergies    Runny Nose and starts coughing at night after being out in pollen all day.     42 has a history of the following: Patient Active Problem List   Diagnosis Date Noted  . Hyperlipidemia 12/13/2018  . Chest pain, musculoskeletal 02/22/2014  . Smoker 02/22/2014  . Foot contusion 08/23/2012    History obtained from: chart review and patient and wife.  08/25/2012 was referred by Tony Morn, MD.     [name] is a 49 y.o. male presenting for an evaluation of difficulty breathing and runny nose.   Asthma/Respiratory Symptom History: He repotrs that he has problems when he is outdoors. He has coughing at night nearly every night. She is not on any inhalers at all. He has been given one inhaler. He has used it and it does help.  That was prescribed about 4 or 5 months. He never goes to the ED for breathing pronblems. He has never been on prednisone for poison ivy. He did got the ED for chest pain. A CXR showed mild bronchitic changes. Breathing did improve with the prednisone that he was given for poison ivy.   Allergic Rhinitis Symptom History: He does sneeze quite a bit and he has rhinorrhea that is ongoing for years. He does use Flonase and it does help. He uses  this as needed. It does seem to help. He does not use antihistamines. Symptoms are mostly during the summer months. He has no problems in the winter months. He denies sinus infections. He has no issues around animals. He has been tested when he was a youngster. He has had allergies during his life time. He grew up in this area.  Otherwise, there is no history of other atopic diseases, including food allergies, drug allergies, stinging insect allergies, eczema, urticaria or contact dermatitis. There is no significant infectious history. Vaccinations are up to date.    Past Medical History: Patient Active Problem List   Diagnosis Date Noted  . Hyperlipidemia 12/13/2018  . Chest pain, musculoskeletal 02/22/2014  . Smoker 02/22/2014  . Foot contusion 08/23/2012    Medication List:  Allergies as of 04/27/2020      Reactions   Penicillins Other (See Comments)   Was told he was allergic, unknown reaction   Chantix [varenicline Tartrate]    Severe dreams      Medication List       Accurate as of Apr 27, 2020 12:46 PM. If you have any questions, ask your nurse or doctor.        STOP taking these medications   predniSONE 20 MG tablet Commonly known as: DELTASONE Stopped by: Tony Spruce, MD     TAKE these medications   albuterol 108 (90 Base) MCG/ACT inhaler Commonly known as: VENTOLIN HFA Inhale 2 puffs into the lungs every 6 (six) hours as needed for wheezing or shortness of breath. Started by: Tony Spruce, MD   atorvastatin 10 MG tablet Commonly known as: LIPITOR Take 1 tablet (10 mg total) by mouth daily.   budesonide-formoterol 160-4.5 MCG/ACT inhaler Commonly known as: Symbicort Inhale 2 puffs into the lungs 2 (two) times daily. Started by: Tony Spruce, MD   cetirizine 10 MG tablet Commonly known as: ZYRTEC Take 1 tablet (10 mg total) by mouth daily. Started by: Tony Spruce, MD   HYDROcodone-acetaminophen 7.5-325 MG  tablet Commonly known as: NORCO 1/2 pill to 1 pill at a time every 4 hours as needed severe pain caution drowsiness   naproxen 500 MG tablet Commonly known as: Naprosyn 1 twice daily for chest wall pain for next 7 days then twice daily as needed       Birth History: non-contributory  Developmental History: non-contributory  Past Surgical History: Past Surgical History:  Procedure Laterality Date  . arm surgery     LEFT  . LEG SURGERY     LEFT     Family History: Family History  Problem Relation Age of Onset  . Asthma Father   . Emphysema Father        smoked  . Asthma Mother   . Emphysema Mother        smoked  . Eczema Brother   . Allergic rhinitis Neg Hx   . Angioedema Neg Hx   . Atopy Neg Hx   . Immunodeficiency Neg Hx   .  Urticaria Neg Hx      Social History: Kenn lives at home with his family. They live in a house that has a hardwood floor throughout the home. They have electric heating and central cooling. There are no animals inside or outside of the home. There are dust mite coverings on the bedding. There is tobacco exposure in the home and the car. 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 He has been a smoker for 33 pack years since 1988.     Review of Systems  Constitutional: Negative.  Negative for chills, fever, malaise/fatigue and weight loss.  HENT: Positive for congestion. Negative for ear discharge and ear pain.   Eyes: Negative for pain, discharge and redness.  Respiratory: Positive for cough. Negative for sputum production, shortness of breath and wheezing.   Cardiovascular: Negative.  Negative for chest pain and palpitations.  Gastrointestinal: Negative for abdominal pain, constipation, diarrhea, heartburn, nausea and vomiting.  Skin: Negative.  Negative for itching and rash.  Neurological: Negative for dizziness and headaches.  Endo/Heme/Allergies: Negative for environmental allergies.  Does not bruise/bleed easily.       Objective:   Blood pressure 132/82, pulse 72, temperature 97.8 F (36.6 C), temperature source Temporal, resp. rate 18, height 5' 9.8" (1.773 m), weight 223 lb 3.2 oz (101.2 kg), SpO2 98 %. Body mass index is 32.21 kg/m.   Physical Exam:   Physical Exam  Constitutional: He appears well-developed and well-nourished.  Amusing male. Cooperative with the exam.   HENT:  Head: Normocephalic and atraumatic.  Right Ear: Tympanic membrane, external ear and ear canal normal. No drainage, swelling or tenderness. Tympanic membrane is not injected, not scarred, not erythematous, not retracted and not bulging.  Left Ear: Tympanic membrane, external ear and ear canal normal. No drainage, swelling or tenderness. Tympanic membrane is not injected, not scarred, not erythematous, not retracted and not bulging.  Nose: Mucosal edema and rhinorrhea present. No nasal deformity or septal deviation. No epistaxis. Right sinus exhibits no maxillary sinus tenderness and no frontal sinus tenderness. Left sinus exhibits no maxillary sinus tenderness and no frontal sinus tenderness.  Mouth/Throat: Uvula is midline and oropharynx is clear and moist. Mucous membranes are not pale and not dry.  Eyes: Pupils are equal, round, and reactive to light. Conjunctivae and EOM are normal. Right eye exhibits no chemosis and no discharge. Left eye exhibits no chemosis and no discharge. Right conjunctiva is not injected. Left conjunctiva is not injected.  Cardiovascular: Normal rate, regular rhythm and normal heart sounds.  Respiratory: Effort normal. No accessory muscle usage. No tachypnea. No respiratory distress. He has no wheezes. He has rhonchi in the right upper field, the right middle field, the right lower field, the left upper field, the left middle field and the left lower field. He has no rales. He exhibits no tenderness.  Moving air well in all lung fields. No increased work of  breathough.   GI: There is no abdominal tenderness. There is no rebound and no guarding.  Lymphadenopathy:       Head (right side): No submandibular, no tonsillar and no occipital adenopathy present.       Head (left side): No submandibular, no tonsillar and no occipital adenopathy present.    He has no cervical adenopathy.  Neurological: He is alert.  Skin: No abrasion, no petechiae and no rash noted. Rash is not papular, not vesicular and not urticarial. No erythema. No pallor.  No eczematous or urticaria lesions noted.   Psychiatric: He has a normal mood and affect.     Diagnostic studies:    Spirometry: Initially the spirometry showed FEV1 and FVC in the 55-65% range. He received two treatments of albuterol (four puffs each) with near normalization in the values. Spirometry scanned into the system.     Allergy Studies:    Airborne Adult Perc - 04/27/20 0933    Time Antigen Placed  0933    Allergen Manufacturer  Waynette Buttery    Location  Back    Number of Test  59    Panel 1  --    1. Control-Buffer 50% Glycerol  Negative   2. Control-Histamine 1 mg/ml  Negative   3. Albumin saline  Negative   4. Bahia  Negative   5. [place] Southern Territ[place]  Negative   6. Johnson  Negative   7. Kentucky Blue  Negative   8. Meadow Fescue  Negative   9. Perennial Rye  Negative   10. Sweet Vernal  Negative   11. Timothy  Negative   12. Cocklebur  Negative   13. Burweed Marshelder  Negative   14. Ragweed, short  Negative   15. Ragweed, Giant  Negative   16. Plantain,  English  Negative   17. Lamb's Quarters  Negative   18. Sheep Sorrell  Negative   19. Rough Pigweed  Negative   20. Marsh Elder, Rough  Negative   21. Mugwort, Common  Negative   22. Ash mix  Negative   23. Birch mix  Negative   24. Beech American  Negative   25. Box, Elder  Negative   26. Cedar, red  Negative   27. Cottonwood, Guinea-Bissau  Negative   28. Elm mix  Negative   29. Hickory  Negative   30. Maple mix  Negative   31. Oak, Guinea-Bissau  mix  Negative   32. Pecan Pollen  Negative   33. Pine mix  Negative   34. Sycamore Eastern  Negative   35. Walnut, Black Pollen  Negative   36. Alternaria alternata  Negative   37. Cladosporium Herbarum  Negative   38. Aspergillus mix  Negative   39. Penicillium mix  Negative   40. Bipolaris sorokiniana (Helminthosporium)  Negative   41. Drechslera spicifera (Curvularia)  Negative   42. Mucor plumbeus  Negative   43. Fusarium moniliforme  Negative   44. Aureobasidium pullulans (pullulara)  Negative   45. Rhizopus oryzae  Negative   46. Botrytis cinera  Negative   47. Epicoccum nigrum  Negative   48. Phoma betae  Negative   49. Candida Albicans  Negative   50. Trichophyton mentagrophytes  Negative   51. Mite, D Farinae  5,000 AU/ml  Negative   52. Mite, D Pteronyssinus  5,000 AU/ml  Negative   53. Cat Hair 10,000 BAU/ml  Negative   54.  Dog Epithelia  Negative   55. Mixed Feathers  Negative   56. Horse Epithelia  Negative   57. Cockroach, German  Negative   58. Mouse  Negative   59. Tobacco Leaf  Negative    Intradermal - 04/27/20 1050    Time Antigen Placed  1050    Allergen Manufacturer  Waynette Buttery    Location  Back    Number of Test  15    Intradermal  Select    Control  Negative    French Southern Territ[place]  Negative    Johnson  Negative    7  Grass  Negative    Ragweed mix  Negative    Weed mix  Negative    Tree mix  Negative    Mold 1  Negative    Mold 2  Negative    Mold 3  Negative    Mold 4  Negative    Cat  Negative    Dog  Negative    Cockroach  Negative    Mite mix  Negative       Allergy testing results were read and interpreted by myself, documented by clinical staff.         Salvatore Marvel, MD [facility]

## 2020-04-27 NOTE — Patient Instructions (Addendum)
1. Moderate persistent asthma, uncomplicated - Lung testing looked terrible today, but it did improve with the albuterol treatments.  - I think you need to take something on a daily basis for the best management of your symptoms.  - Stopping smoking would also help with your breathing, as I am sure you know.  - Spacer sample and demonstration provided. - Daily controller medication(s): Symbicort 160/4.31mcg two puffs twice daily with spacer - Prior to physical activity: albuterol 2 puffs 10-15 minutes before physical activity. - Rescue medications: albuterol 4 puffs every 4-6 hours as needed - Asthma control goals:  * Full participation in all desired activities (may need albuterol before activity) * Albuterol use two time or less a week on average (not counting use with activity) * Cough interfering with sleep two time or less a month * Oral steroids no more than once a year * No hospitalizations  2. Chronic rhinitis - Testing today showed: negative to the entire panel  - This might be due to your smoking, which can cause nasal congestion and allergy-like symptoms.  - Alternatively, this may be what is known as "local allergic rhinitis", which occurs when your immune system makes localized IgE (allergy antibodies) to various allergens (as opposed to systemic) IgE - Therefore, since IgE is not make systemically, allergic sensitizations are not picked up on routine skin testing. - There are specialized academic research centers that can test for IgE in the nasal cavity, but this is not done in the clinical setting at this time.  - It can still be treated with nasal sprays and antihistamines.  - Copy of test results provided.  - Avoidance measures provided. - Continue with: Flonase (fluticasone) one spray per nostril daily (but use EVERY DAY for the best effect) - Start taking: Zyrtec (cetirizine) 10mg  tablet once daily (DURING WORST TIMES OF THE YEAR ONLY) - You can use an extra dose of the  antihistamine, if needed, for breakthrough symptoms.  - Consider nasal saline rinses 1-2 times daily to remove allergens from the nasal cavities as well as help with mucous clearance (this is especially helpful to do before the nasal sprays are given)  3. Return in about 4 weeks (around 05/25/2020). This can be an in-person, a virtual Webex or a telephone follow up visit.   Please inform 05/27/2020 of any Emergency Department visits, hospitalizations, or changes in symptoms. Call us before going to the ED for breathing or allergy symptoms since we might be able to fit you in for a sick visit. Feel free to contact us anytime with any questions, problems, or concerns.  It was a pleasure to meet you today!  Websites that have reliable patient information: 1. American Academy of Asthma, Allergy, and Immunology: www.aaaai.org 2. Food Allergy Research and Education (FARE): foodallergy.org 3. Mothers of Asthmatics: http://www.asthmacommunitynetwork.org 4. American College of Allergy, Asthma, and Immunology: www.acaai.org   COVID-19 Vaccine Information can be found at: [place] For questions related to vaccine distribution or appointments, please email [email or web address] or call [phone].     "Like" [phone] on Facebook and Instagram for our latest updates!       HAPPY SPRING!  Make sure you are registered to vote! If you have moved or changed any of your contact information, you will need to get this updated before voting!  In some cases, you MAY be able to register to vote online: [place]

## 2020-04-29 ENCOUNTER — Encounter: Payer: Self-pay | Admitting: Allergy & Immunology

## 2020-04-30 ENCOUNTER — Telehealth: Payer: Self-pay

## 2020-04-30 NOTE — Telephone Encounter (Signed)
Patients wife called stating the patients Symbicort was on the pricey side. Patients wife states the inhaler is around $150.  She would like to know if there is anything else that can be sent in?   Thanks

## 2020-04-30 NOTE — Telephone Encounter (Signed)
Dr. Dellis Anes please advise. It appears that Flovent, Trelegy, and Virgel Bouquet are covered. Please advise.

## 2020-05-01 ENCOUNTER — Other Ambulatory Visit: Payer: Self-pay | Admitting: Family Medicine

## 2020-05-01 MED ORDER — BREO ELLIPTA 200-25 MCG/INH IN AEPB: 1.0000 | INHALATION_SPRAY | Freq: Every day | RESPIRATORY_TRACT | 5 refills | Status: DC

## 2020-05-01 NOTE — Addendum Note (Signed)
Addended by: Dollene Cleveland R on: 05/01/2020 03:15 PM   Modules accepted: Orders

## 2020-05-01 NOTE — Telephone Encounter (Signed)
New prescription has been sent in. Called patient and advised. Patient verbalized understanding and will call back with any issues.

## 2020-05-01 NOTE — Telephone Encounter (Signed)
That is expensive! Let's do Breo 200/25 one puff once daily.  Malachi Bonds, MD [facility]

## 2020-05-28 ENCOUNTER — Ambulatory Visit: Payer: Medicare HMO | Admitting: Allergy & Immunology

## 2020-06-04 ENCOUNTER — Encounter (HOSPITAL_COMMUNITY): Payer: Self-pay | Admitting: *Deleted

## 2020-06-04 ENCOUNTER — Other Ambulatory Visit: Payer: Self-pay

## 2020-06-04 ENCOUNTER — Emergency Department (HOSPITAL_COMMUNITY)
Admission: EM | Admit: 2020-06-04 | Discharge: 2020-06-04 | Disposition: A | Discharge status: Home / Self Care | Payer: Medicare HMO | Attending: Emergency Medicine | Admitting: Emergency Medicine

## 2020-06-04 ENCOUNTER — Emergency Department (HOSPITAL_COMMUNITY): Payer: Medicare HMO

## 2020-06-04 DIAGNOSIS — J45909 Unspecified asthma, uncomplicated: Secondary | ICD-10-CM | POA: Insufficient documentation

## 2020-06-04 DIAGNOSIS — Z7951 Long term (current) use of inhaled steroids: Secondary | ICD-10-CM | POA: Insufficient documentation

## 2020-06-04 DIAGNOSIS — Y939 Activity, unspecified: Secondary | ICD-10-CM | POA: Insufficient documentation

## 2020-06-04 DIAGNOSIS — F17210 Nicotine dependence, cigarettes, uncomplicated: Secondary | ICD-10-CM | POA: Insufficient documentation

## 2020-06-04 DIAGNOSIS — W2209XA Striking against other stationary object, initial encounter: Secondary | ICD-10-CM | POA: Diagnosis not present

## 2020-06-04 DIAGNOSIS — Y999 Unspecified external cause status: Secondary | ICD-10-CM | POA: Diagnosis not present

## 2020-06-04 DIAGNOSIS — Y929 Unspecified place or not applicable: Secondary | ICD-10-CM | POA: Insufficient documentation

## 2020-06-04 DIAGNOSIS — J449 Chronic obstructive pulmonary disease, unspecified: Secondary | ICD-10-CM | POA: Diagnosis not present

## 2020-06-04 DIAGNOSIS — Z79899 Other long term (current) drug therapy: Secondary | ICD-10-CM | POA: Insufficient documentation

## 2020-06-04 DIAGNOSIS — S92514A Nondisplaced fracture of proximal phalanx of right lesser toe(s), initial encounter for closed fracture: Secondary | ICD-10-CM | POA: Diagnosis not present

## 2020-06-04 DIAGNOSIS — S99921A Unspecified injury of right foot, initial encounter: Secondary | ICD-10-CM | POA: Diagnosis not present

## 2020-06-04 DIAGNOSIS — R69 Illness, unspecified: Secondary | ICD-10-CM | POA: Diagnosis not present

## 2020-06-04 MED ORDER — HYDROCODONE-ACETAMINOPHEN 5-325 MG PO TABS: 1.0000 | ORAL_TABLET | ORAL | 0 refills | Status: DC | PRN

## 2020-06-04 NOTE — Discharge Instructions (Signed)
Wear walking boot until you can follow up with your doctor Take Norco as needed for severe pain Take Ibuprofen or Tylenol for mild-moderate pain Please elevate and ice the foot to help with pain and swelling Follow up with your doctor for a recheck

## 2020-06-04 NOTE — ED Triage Notes (Signed)
Pt states he hit his foot on a piece of furniture last night and is having pain and swelling to the top of his right foot; pt unable to walk on his toes

## 2020-06-04 NOTE — ED Provider Notes (Signed)
[facility] EMERGENCY DEPARTMENT Provider Note   CSN: [id number] Arrival date & time: 06/04/20  1725     History Chief Complaint  Patient presents with  . Foot Pain    Tony Doyle is a 49 y.o. male who presents with R foot pain. He states that he was wearing flip flops yesterday and was kicking them off when he got in the house and kicked a piece of furniture. Since then he's had a pain over the middle of his foot and it hurts to ambulate. He hasn't been able to walk normal due to pain. He states that it gets swollen and throbs especially at night. Rest makes it better.   HPI     Past Medical History:  Diagnosis Date  . Asthma   . COPD (chronic obstructive pulmonary disease) (HCC)   . Hypercholesterolemia   . Pneumonia     Patient Active Problem List   Diagnosis Date Noted  . Hyperlipidemia 12/13/2018  . Chest pain, musculoskeletal 02/22/2014  . Smoker 02/22/2014  . Foot contusion 08/23/2012    Past Surgical History:  Procedure Laterality Date  . arm surgery     LEFT  . LEG SURGERY     LEFT       Family History  Problem Relation Age of Onset  . Asthma Father   . Emphysema Father        smoked  . Asthma Mother   . Emphysema Mother        smoked  . Eczema Brother   . Allergic rhinitis Neg Hx   . Angioedema Neg Hx   . Atopy Neg Hx   . Immunodeficiency Neg Hx   . Urticaria Neg Hx     Social History   Tobacco Use  . Smoking status: Current Every Day Smoker    Packs/day: 1.00    Years: 33.00    Pack years: 33.00    Types: Cigarettes    Start date: 56  . Smokeless tobacco: Never Used  Vaping Use  . Vaping Use: Never used  Substance Use Topics  . Alcohol use: No  . Drug use: No    Home Medications Prior to Admission medications   Medication Sig Start Date End Date Taking? Authorizing Provider  albuterol (VENTOLIN HFA) 108 (90 Base) MCG/ACT inhaler Inhale 2 puffs into the lungs every 6 (six) hours as needed for wheezing or shortness of  breath. 04/27/20   Alfonse Spruce, MD  albuterol (VENTOLIN HFA) 108 (90 Base) MCG/ACT inhaler Inhale 2 puffs into the lungs every 4 (four) hours as needed for wheezing or shortness of breath. 04/27/20   Alfonse Spruce, MD  atorvastatin (LIPITOR) 10 MG tablet Take 1 tablet (10 mg total) by mouth daily. 02/07/20   Babs Sciara, MD  budesonide-formoterol (SYMBICORT) 160-4.5 MCG/ACT inhaler Inhale 2 puffs into the lungs 2 (two) times daily. 04/27/20   Alfonse Spruce, MD  cetirizine (ZYRTEC) 10 MG tablet Take 1 tablet (10 mg total) by mouth daily. 04/27/20   Alfonse Spruce, MD  fluticasone [facility]) 50 MCG/ACT nasal spray SPRAY 2 SPRAYS INTO EACH NOSTRIL EVERY DAY 05/01/20   Babs Sciara, MD  fluticasone furoate-vilanterol (BREO ELLIPTA) 200-25 MCG/INH AEPB Inhale 1 puff into the lungs daily. 05/01/20   Alfonse Spruce, MD  HYDROcodone-acetaminophen [facility]) 7.5-325 MG tablet 1/2 pill to 1 pill at a time every 4 hours as needed severe pain caution drowsiness 02/16/20   Luking, Jonna Coup, MD  naproxen (NAPROSYN)  500 MG tablet 1 twice daily for chest wall pain for next 7 days then twice daily as needed 02/16/20   Babs Sciara, MD    Allergies    Penicillins and Chantix [varenicline tartrate]  Review of Systems   Review of Systems  Musculoskeletal: Positive for arthralgias and joint swelling.  Skin: Negative for wound.    Physical Exam Updated Vital Signs BP (!) 158/74 (BP Location: Right Arm)   Pulse 72   Temp 98.1 F (36.7 C) (Oral)   Resp 16   Ht 5\' 5"  (1.651 m)   Wt 97.5 kg   SpO2 98%   BMI 35.78 kg/m   Physical Exam Vitals and nursing note reviewed.  Constitutional:      General: He is not in acute distress.    Appearance: Normal appearance. He is well-developed. He is not ill-appearing.  HENT:     Head: Normocephalic and atraumatic.  Eyes:     General: No scleral icterus.       Right eye: No discharge.        Left eye: No discharge.      Conjunctiva/sclera: Conjunctivae normal.     Pupils: Pupils are equal, round, and reactive to light.  Cardiovascular:     Rate and Rhythm: Normal rate.  Pulmonary:     Effort: Pulmonary effort is normal. No respiratory distress.  Abdominal:     General: There is no distension.  Musculoskeletal:     Cervical back: Normal range of motion.     Comments: Right foot: Point tenderness just proximal to the base of the 3rd toe. No ankle or heel tenderness. 2+ DP pulse  Skin:    General: Skin is warm and dry.  Neurological:     Mental Status: He is alert and oriented to person, place, and time.  Psychiatric:        Behavior: Behavior normal.     ED Results / Procedures / Treatments   Labs (all labs ordered are listed, but only abnormal results are displayed) Labs Reviewed - No data to display  EKG None  Radiology DG Foot Complete Right  Result Date: 06/04/2020 CLINICAL DATA:  Blunt trauma to the foot EXAM: RIGHT FOOT COMPLETE - 3+ VIEW COMPARISON:  None. FINDINGS: Irregularity in the midshaft proximal phalanx third digit. Potential nondisplaced fracture. Small cortical step-off seen on 1 projection. The potential fracture extends into the metaphysis. No dislocation IMPRESSION: Concern for oblique fracture of the shaft of the proximal phalanx third digit. Recommend correlation for point tenderness. Electronically Signed   By: 06/06/2020 M.D.   On: 06/04/2020 18:42    Procedures Procedures (including critical care time)  Medications Ordered in ED Medications - No data to display  ED Course  I have reviewed the triage vital signs and the nursing notes.  Pertinent labs & imaging results that were available during my care of the patient were reviewed by me and considered in my medical decision making (see chart for details).  49 year old male presents with R foot pain after an injury last night. Xray confirms fracture of the proximal phalanx of the 3rd digit. Will place in CAM  walker boot and rx short course of pain medicine. Advised f/u with PCP.  MDM Rules/Calculators/A&P                           Final Clinical Impression(s) / ED Diagnoses Final diagnoses:  Closed nondisplaced fracture of proximal  phalanx of lesser toe of right foot, initial encounter    Rx / DC Orders ED Discharge Orders    None       Beryle Quant 06/04/20 Carin Primrose, MD 06/05/20 1714

## 2020-06-06 ENCOUNTER — Ambulatory Visit: Payer: Medicare HMO | Admitting: Allergy & Immunology

## 2020-06-06 ENCOUNTER — Encounter: Payer: Self-pay | Admitting: Allergy & Immunology

## 2020-06-06 ENCOUNTER — Other Ambulatory Visit: Payer: Self-pay

## 2020-06-06 VITALS — BP 134/78 | HR 90 | Resp 14

## 2020-06-06 DIAGNOSIS — J4540 Moderate persistent asthma, uncomplicated: Secondary | ICD-10-CM

## 2020-06-06 DIAGNOSIS — J310 Chronic rhinitis: Secondary | ICD-10-CM

## 2020-06-06 NOTE — Progress Notes (Signed)
FOLLOW UP  Date of Service/Encounter:  06/06/20   Assessment:   Moderate persistent asthma, uncomplicated  Chronic rhinitis    Plan/Recommendations:   1. Moderate persistent asthma, uncomplicated - Lung testing looked better today than the last time I saw you. - I think you are on the right track. - Continue with the same medications for now.  - Daily controller medication(s): Breo 200/15mcg one puff once daily - Prior to physical activity: albuterol 2 puffs 10-15 minutes before physical activity. - Rescue medications: albuterol 4 puffs every 4-6 hours as needed - Asthma control goals:  * Full participation in all desired activities (may need albuterol before activity) * Albuterol use two time or less a week on average (not counting use with activity) * Cough interfering with sleep two time or less a month * Oral steroids no more than once a year * No hospitalizations  2. Chronic non-allergic rhinitis - Continue with: Flonase (fluticasone) one spray per nostril daily (but use EVERY DAY for the best effect) and Zyrtec (cetirizine) 10mg  tablet once daily (DURING WORST TIMES OF THE YEAR ONLY) - Consider nasal saline rinses 1-2 times daily to remove allergens from the nasal cavities as well as help with mucous clearance (this is especially helpful to do before the nasal sprays are given)  3. Return in about 3 months (around 09/06/2020). This can be an in-person, a virtual Webex or a telephone follow up visit.  Subjective:   Tony Doyle is a 49 y.o. male presenting today for follow up of  Chief Complaint  Patient presents with  . Asthma    42 has a history of the following: Patient Active Problem List   Diagnosis Date Noted  . Hyperlipidemia 12/13/2018  . Chest pain, musculoskeletal 02/22/2014  . Smoker 02/22/2014  . Foot contusion 08/23/2012    History obtained from: chart review and patient.  Tony Doyle is a 49 y.o. male presenting for a follow up  visit.  He was last seen in May 2021 for new evaluation.  At that time, his lung testing looked terrible but it improved with albuterol.  We recommended taking something on a daily basis to help with his breathing.  We started Symbicort 160/4.5 mcg 2 puffs twice daily with a spacer as well as albuterol as needed.  He had environmental allergy testing that was negative to the entire panel.  We felt this was related to his smoking versus local allergic rhinitis.  Regardless, we continued the Flonase and started Zyrtec.  In the interim, he called back and his Symbicort was too expensive.  We substituted Breo 200/25 mcg 1 puff once daily.  Since the last visit, he actually had a fracture of his right foot. He is now in a boot and will hae to be in that for 8 weeks.   Asthma/Respiratory Symptom History: He remains on the Breo one puff once daily. This was much better covered. He does feel that it helps. He is using it but not too often. This all depends on what he is doing. He is not really coughing at night and the coughing has "slowed off".  He has not needed prednisone at all.   Allergic Rhinitis Symptom History: He is using the Flonase more regularly. It has been keeping him clear. He is happy with how well he is doing.  He has not needed any antibiotics at all.  Otherwise, there have been no changes to his past medical history, surgical history, family history,  or social history.    Review of Systems  Constitutional: Negative.  Negative for fever, malaise/fatigue and weight loss.  HENT: Negative.  Negative for congestion, ear discharge and ear pain.   Eyes: Negative for pain, discharge and redness.  Respiratory: Negative for cough, sputum production, shortness of breath and wheezing.   Cardiovascular: Negative.  Negative for chest pain and palpitations.  Gastrointestinal: Negative for abdominal pain and heartburn.  Skin: Negative.  Negative for itching and rash.  Neurological: Negative for  dizziness and headaches.  Endo/Heme/Allergies: Negative for environmental allergies. Does not bruise/bleed easily.       Objective:   Blood pressure 134/78, pulse 90, resp. rate 14, SpO2 97 %. There is no height or weight on file to calculate BMI.   Physical Exam:  Physical Exam Constitutional:      Appearance: He is well-developed.     Comments: Amusing male.  Quite jokester.  HENT:     Head: Normocephalic and atraumatic.     Right Ear: Tympanic membrane, ear canal and external ear normal.     Left Ear: Tympanic membrane, ear canal and external ear normal.     Nose: No nasal deformity, septal deviation, mucosal edema or rhinorrhea.     Right Turbinates: Enlarged and swollen.     Left Turbinates: Enlarged and swollen. Not pale.     Right Sinus: No maxillary sinus tenderness or frontal sinus tenderness.     Left Sinus: No maxillary sinus tenderness or frontal sinus tenderness.     Mouth/Throat:     Mouth: Mucous membranes are not pale and not dry.     Pharynx: Uvula midline.  Eyes:     General:        Right eye: No discharge.        Left eye: No discharge.     Conjunctiva/sclera: Conjunctivae normal.     Right eye: Right conjunctiva is not injected. No chemosis.    Left eye: Left conjunctiva is not injected. No chemosis.    Pupils: Pupils are equal, round, and reactive to light.  Cardiovascular:     Rate and Rhythm: Normal rate and regular rhythm.     Heart sounds: Normal heart sounds.  Pulmonary:     Effort: Pulmonary effort is normal. No tachypnea, accessory muscle usage or respiratory distress.     Breath sounds: Normal breath sounds. No wheezing, rhonchi or rales.     Comments: Moving air well in all lung fields.  No increased work of breathing. Chest:     Chest wall: No tenderness.  Lymphadenopathy:     Cervical: No cervical adenopathy.  Skin:    Coloration: Skin is not pale.     Findings: No abrasion, erythema, petechiae or rash. Rash is not papular, urticarial  or vesicular.  Neurological:     Mental Status: He is alert.      Diagnostic studies:    Spirometry: results normal (FEV1: 2.48/63%, FVC: 3.43/68%, FEV1/FVC: 72%).    Spirometry consistent with possible restrictive disease.   Allergy Studies: none       Malachi Bonds, MD  [facility]

## 2020-06-06 NOTE — Patient Instructions (Addendum)
1. Moderate persistent asthma, uncomplicated - Lung testing looked better today than the last time I saw you. - I think you are on the right track. - Continue with the same medications for now.  - Daily controller medication(s): Breo 200/38mcg one puff once daily - Prior to physical activity: albuterol 2 puffs 10-15 minutes before physical activity. - Rescue medications: albuterol 4 puffs every 4-6 hours as needed - Asthma control goals:  * Full participation in all desired activities (may need albuterol before activity) * Albuterol use two time or less a week on average (not counting use with activity) * Cough interfering with sleep two time or less a month * Oral steroids no more than once a year * No hospitalizations  2. Chronic non-allergic rhinitis - Continue with: Flonase (fluticasone) one spray per nostril daily (but use EVERY DAY for the best effect) and Zyrtec (cetirizine) 10mg  tablet once daily (DURING WORST TIMES OF THE YEAR ONLY) - Consider nasal saline rinses 1-2 times daily to remove allergens from the nasal cavities as well as help with mucous clearance (this is especially helpful to do before the nasal sprays are given)  3. Return in about 3 months (around 09/06/2020). This can be an in-person, a virtual Webex or a telephone follow up visit.   Please inform 09/08/2020 of any Emergency Department visits, hospitalizations, or changes in symptoms. Call us before going to the ED for breathing or allergy symptoms since we might be able to fit you in for a sick visit. Feel free to contact us anytime with any questions, problems, or concerns.  It was a pleasure to meet you today!  Websites that have reliable patient information: 1. American Academy of Asthma, Allergy, and Immunology: www.aaaai.org 2. Food Allergy Research and Education (FARE): foodallergy.org 3. Mothers of Asthmatics: http://www.asthmacommunitynetwork.org 4. American College of Allergy, Asthma, and Immunology:  www.acaai.org   COVID-19 Vaccine Information can be found at: [place] For questions related to vaccine distribution or appointments, please email [email or web address] or call [phone].     Like [phone] on [place] and Instagram for our latest updates!       HAPPY SPRING!  Make sure you are registered to vote! If you have moved or changed any of your contact information, you will need to get this updated before voting!  In some cases, you MAY be able to register to vote online: [employer]

## 2020-06-07 ENCOUNTER — Encounter: Payer: Self-pay | Admitting: Allergy & Immunology

## 2020-06-13 ENCOUNTER — Telehealth: Payer: Self-pay | Admitting: Family Medicine

## 2020-06-13 NOTE — Telephone Encounter (Signed)
Pt contacted and scheduled for Monday.

## 2020-06-13 NOTE — Telephone Encounter (Signed)
Follow-up should be within 2 weeks. If the patient is having significant issues we can see him Thursday or Friday If he feels he is doing fine we should see him on Monday His option

## 2020-06-13 NOTE — Telephone Encounter (Signed)
Pt is needing ER follow up for broken foot. First available appt is July 29th. Pt has a boot on his foot and is wanting to know when it can come off.   Pt hasnt been scheduled yet wanted to ask Dr. Lorin Picket if it is ok for patient to wait that long for ER follow up.

## 2020-06-18 ENCOUNTER — Other Ambulatory Visit: Payer: Self-pay

## 2020-06-18 ENCOUNTER — Ambulatory Visit (INDEPENDENT_AMBULATORY_CARE_PROVIDER_SITE_OTHER): Payer: Medicare HMO | Admitting: Family Medicine

## 2020-06-18 VITALS — BP 118/76 | Temp 98.2°F | Ht 70.0 in | Wt 229.2 lb

## 2020-06-18 DIAGNOSIS — S92514D Nondisplaced fracture of proximal phalanx of right lesser toe(s), subsequent encounter for fracture with routine healing: Secondary | ICD-10-CM

## 2020-06-18 DIAGNOSIS — M79671 Pain in right foot: Secondary | ICD-10-CM

## 2020-06-18 NOTE — Progress Notes (Signed)
   Subjective:    Patient ID: Tony Doyle, male    DOB: 13-Oct-1971, 49 y.o.   MRN: [id number]  HPI  Patient arrives for a follow up on a recent ER visit for right foot fracture. Nice gentleman was walking in flip-flops at his home and then unfortunately had a piece of furniture causing a fracture in his foot they put him in a special brace at the ER he is doing well with it still working Review of Systems    See above Objective:   Physical Exam  Ankle decent range of motion Normal foot normal except for the third digit      Assessment & Plan:  Painful third digit Fracture Follow-up x-ray indicated Do x-ray at the end of July await the result continue brace for now do range of motion exercises with the ankle follow-up if progressive troubles warning signs discussed

## 2020-07-04 ENCOUNTER — Ambulatory Visit: Payer: Medicare HMO

## 2020-07-05 ENCOUNTER — Other Ambulatory Visit: Payer: Self-pay

## 2020-07-05 ENCOUNTER — Ambulatory Visit (HOSPITAL_COMMUNITY)
Admission: RE | Admit: 2020-07-05 | Discharge: 2020-07-05 | Disposition: A | Discharge status: Home / Self Care | Payer: Medicare HMO | Source: Ambulatory Visit | Attending: Family Medicine | Admitting: Family Medicine

## 2020-07-05 DIAGNOSIS — S92511A Displaced fracture of proximal phalanx of right lesser toe(s), initial encounter for closed fracture: Secondary | ICD-10-CM | POA: Diagnosis not present

## 2020-07-05 DIAGNOSIS — M79671 Pain in right foot: Secondary | ICD-10-CM | POA: Insufficient documentation

## 2020-07-10 ENCOUNTER — Other Ambulatory Visit: Payer: Self-pay | Admitting: *Deleted

## 2020-07-10 DIAGNOSIS — S92901A Unspecified fracture of right foot, initial encounter for closed fracture: Secondary | ICD-10-CM

## 2020-08-01 ENCOUNTER — Other Ambulatory Visit: Payer: Self-pay

## 2020-08-01 ENCOUNTER — Ambulatory Visit (HOSPITAL_COMMUNITY)
Admission: RE | Admit: 2020-08-01 | Discharge: 2020-08-01 | Disposition: A | Discharge status: Home / Self Care | Payer: Medicare HMO | Source: Ambulatory Visit | Attending: Family Medicine | Admitting: Family Medicine

## 2020-08-01 ENCOUNTER — Encounter: Payer: Self-pay | Admitting: Family Medicine

## 2020-08-01 DIAGNOSIS — S92511D Displaced fracture of proximal phalanx of right lesser toe(s), subsequent encounter for fracture with routine healing: Secondary | ICD-10-CM | POA: Diagnosis not present

## 2020-08-01 DIAGNOSIS — S92901A Unspecified fracture of right foot, initial encounter for closed fracture: Secondary | ICD-10-CM | POA: Insufficient documentation

## 2020-08-06 ENCOUNTER — Telehealth: Payer: Self-pay | Admitting: Family Medicine

## 2020-08-06 ENCOUNTER — Encounter: Payer: Self-pay | Admitting: Family Medicine

## 2020-08-06 NOTE — Telephone Encounter (Signed)
Front Please let the patient know that I am sending a response to his question via MyChart thank you

## 2020-08-06 NOTE — Telephone Encounter (Signed)
Wants to see if something can be called in to stop smoking. [facility]   Pt call back [phone]

## 2020-08-07 ENCOUNTER — Ambulatory Visit: Payer: Medicare HMO

## 2020-08-07 ENCOUNTER — Other Ambulatory Visit: Payer: Self-pay | Admitting: *Deleted

## 2020-08-07 MED ORDER — BUPROPION HCL ER (SR) 150 MG PO TB12: 150.0000 mg | ORAL_TABLET | Freq: Two times a day (BID) | ORAL | 2 refills | Status: DC

## 2020-08-07 NOTE — Telephone Encounter (Signed)
Called and discussed with pt. Pt states no history of seizure. Med sent to [facility].

## 2020-08-07 NOTE — Telephone Encounter (Signed)
Nurses Please confirm with patient he has no history of seizures (Medication is contraindicated in individuals with a history of seizures)  If no history of seizures I recommend Wellbutrin 150 mg SR 1 tablet twice daily #60 with 2 refills Patient should try quitting smoking 7 days after starting medication Follow-up if any ongoing troubles notify us if any issues or side effects with medicine if medication is making him feel worse or making him feel depressed stop the medicine

## 2020-08-07 NOTE — Telephone Encounter (Signed)
Called and notified

## 2020-08-30 ENCOUNTER — Other Ambulatory Visit: Payer: Self-pay | Admitting: Family Medicine

## 2020-08-31 NOTE — Telephone Encounter (Signed)
May do 90 day refill follow up in 2 to 3 months

## 2020-09-07 ENCOUNTER — Ambulatory Visit: Payer: Medicare HMO | Admitting: Allergy & Immunology

## 2020-09-12 ENCOUNTER — Other Ambulatory Visit: Payer: Self-pay

## 2020-09-12 ENCOUNTER — Encounter: Payer: Self-pay | Admitting: Allergy & Immunology

## 2020-09-12 ENCOUNTER — Ambulatory Visit: Payer: Medicare HMO | Admitting: Allergy & Immunology

## 2020-09-12 VITALS — BP 112/74 | HR 86 | Temp 99.1°F

## 2020-09-12 DIAGNOSIS — F17200 Nicotine dependence, unspecified, uncomplicated: Secondary | ICD-10-CM | POA: Diagnosis not present

## 2020-09-12 DIAGNOSIS — J310 Chronic rhinitis: Secondary | ICD-10-CM | POA: Diagnosis not present

## 2020-09-12 DIAGNOSIS — J449 Chronic obstructive pulmonary disease, unspecified: Secondary | ICD-10-CM | POA: Diagnosis not present

## 2020-09-12 DIAGNOSIS — J4520 Mild intermittent asthma, uncomplicated: Secondary | ICD-10-CM | POA: Diagnosis not present

## 2020-09-12 DIAGNOSIS — R69 Illness, unspecified: Secondary | ICD-10-CM | POA: Diagnosis not present

## 2020-09-12 MED ORDER — ALBUTEROL SULFATE (2.5 MG/3ML) 0.083% IN NEBU: 2.5000 mg | INHALATION_SOLUTION | Freq: Every evening | RESPIRATORY_TRACT | 3 refills | Status: DC

## 2020-09-12 NOTE — Patient Instructions (Addendum)
1. Moderate persistent asthma, uncomplicated - Lung testing looked stable.  - We are going to add on an albuterol nebulizer machine nightly (this is not routinely done, but with your smoking history, there is likely some COPD going on with the asthma).  - Hopefully this will help with your nighttime coughing.  - Give [place] an update in a week or so.  - Nebulizer machine and instructions provided today.  - Daily controller medication(s): Breo 200/87mcg one puff once daily + albuterol nebulizer once at night - Prior to physical activity: albuterol 2 puffs 10-15 minutes before physical activity. - Rescue medications: albuterol 4 puffs every 4-6 hours as needed - Asthma control goals:  * Full participation in all desired activities (may need albuterol before activity) * Albuterol use two time or less a week on average (not counting use with activity) * Cough interfering with sleep two time or less a month * Oral steroids no more than once a year * No hospitalizations  2. Chronic non-allergic rhinitis - Continue with: Flonase (fluticasone) one spray per nostril daily (but use EVERY DAY for the best effect) and Zyrtec (cetirizine) 10mg  tablet once daily (DURING WORST TIMES OF THE YEAR ONLY) - Consider nasal saline rinses 1-2 times daily to remove allergens from the nasal cavities as well as help with mucous clearance (this is especially helpful to do before the nasal sprays are given)  3. Return in about 6 months (around 03/13/2021).    Please inform 05/13/2021 of any Emergency Department visits, hospitalizations, or changes in symptoms. Call us before going to the ED for breathing or allergy symptoms since we might be able to fit you in for a sick visit. Feel free to contact us anytime with any questions, problems, or concerns.  It was a pleasure to see you again today!  Websites that have reliable patient information: 1. American Academy of Asthma, Allergy, and Immunology: www.aaaai.org 2. Food Allergy  Research and Education (FARE): foodallergy.org 3. Mothers of Asthmatics: http://www.asthmacommunitynetwork.org 4. American College of Allergy, Asthma, and Immunology: www.acaai.org   COVID-19 Vaccine Information can be found at: [place] For questions related to vaccine distribution or appointments, please email [email or web address] or call [phone].     "Like" [phone] on Facebook and Instagram for our latest updates!     HAPPY FALL!     Make sure you are registered to vote! If you have moved or changed any of your contact information, you will need to get this updated before voting!  In some cases, you MAY be able to register to vote online: [place]

## 2020-09-12 NOTE — Progress Notes (Signed)
FOLLOW UP  Date of Service/Encounter:  09/12/20   Assessment:   Asthma with COPD overlap  Chronic rhinitis  Current smoker   [name] presents for follow-up visit.  He continues to do fairly well symptomatically on the [place], which does allow him to do his work during the day without shortness of breath and coughing episodes.  However, at night his symptoms seem to get worse.  He does have a remote history of reflux, but he has noted that using his daughter's albuterol nebulizer prior to going to bed has helped.  We are going to add that on every night.  This is not a routine practice for run-of-the-mill asthma, but he clearly has COPD asthma overlap.  We will see how he does functionally on this.  He is going to call us in a week to let us know.  His spirometry today was stable, but is never been normal.  Plan/Recommendations:   1. Moderate persistent asthma, uncomplicated - Lung testing looked stable.  - We are going to add on an albuterol nebulizer machine nightly (this is not routinely done, but with your smoking history, there is likely some COPD going on with the asthma).  - Hopefully this will help with your nighttime coughing.  - Give [place] an update in a week or so.  - Nebulizer machine and instructions provided today.  - Daily controller medication(s): Breo 200/53mcg one puff once daily + albuterol nebulizer once at night - Prior to physical activity: albuterol 2 puffs 10-15 minutes before physical activity. - Rescue medications: albuterol 4 puffs every 4-6 hours as needed - Asthma control goals:  * Full participation in all desired activities (may need albuterol before activity) * Albuterol use two time or less a week on average (not counting use with activity) * Cough interfering with sleep two time or less a month * Oral steroids no more than once a year * No hospitalizations  2. Chronic non-allergic rhinitis - Continue with: Flonase (fluticasone) one spray per  nostril daily (but use EVERY DAY for the best effect) and Zyrtec (cetirizine) 10mg  tablet once daily (DURING WORST TIMES OF THE YEAR ONLY) - Consider nasal saline rinses 1-2 times daily to remove allergens from the nasal cavities as well as help with mucous clearance (this is especially helpful to do before the nasal sprays are given)  3. Return in about 6 months (around 03/13/2021).   Subjective:   Tony Doyle is a 49 y.o. male presenting today for follow up of  Chief Complaint  Patient presents with  . Follow-up  . Asthma    only has issues when outside and at night    42 has a history of the following: Patient Active Problem List   Diagnosis Date Noted  . Asthma with COPD (HCC) 09/12/2020  . Non-allergic rhinitis 09/12/2020  . Hyperlipidemia 12/13/2018  . Chest pain, musculoskeletal 02/22/2014  . Smoker 02/22/2014  . Foot contusion 08/23/2012    History obtained from: chart review and patient.  Tony Doyle is a 49 y.o. male presenting for a follow up visit.  He was last seen in June 2021.  At that time, his lung testing looked fairly good.  It was certainly better than when I first saw him.  We continue with Breo 200/25 mcg 1 puff once daily as well as albuterol 2 puffs every 4-6 hours as needed.  For his nonallergic rhinitis, would continue with Flonase as well as Zyrtec.  We recommended using the  Flonase daily and the Zyrtec only during the worst times of the year.  Since last visit, he has done well.  Asthma/Respiratory Symptom History: He remains on the Breo one puff once daily. He does report some thightness at night. This he gets to coughing every night. He does get to sleep eventually, but there are times when he takes several hours to get to bed. He gets around 4 hours per night. He has been able to tolerate his work much better.  He will have occasional heart burn but this is not routine. He did try try some albuterol nebulizer once and it did provide some relief.   This was his daughter's nebulizer machine.  He did feel much better after using it at night.  Allergic Rhinitis Symptom History: He has been on the nose spray and the cetirizine daily. Previous testing has been completely negative.  He has not required any antibiotics at all since last visit.  Postnasal drip has been fairly well controlled.  Otherwise, there have been no changes to his past medical history, surgical history, family history, or social history.    Review of Systems  Constitutional: Negative.  Negative for fever, malaise/fatigue and weight loss.  HENT: Negative.  Negative for congestion, ear discharge and ear pain.   Eyes: Negative for pain, discharge and redness.  Respiratory: Positive for shortness of breath. Negative for cough (at night), sputum production and wheezing.   Cardiovascular: Negative.  Negative for chest pain and palpitations.  Gastrointestinal: Negative for abdominal pain, constipation, diarrhea, heartburn, nausea and vomiting.  Skin: Negative.  Negative for itching and rash.  Neurological: Negative for dizziness and headaches.  Endo/Heme/Allergies: Negative for environmental allergies. Does not bruise/bleed easily.       Objective:   Blood pressure 112/74, pulse 86, temperature 99.1 F (37.3 C), temperature source Temporal, SpO2 97 %. There is no height or weight on file to calculate BMI.   Physical Exam:  Physical Exam Constitutional:      Appearance: He is well-developed.     Comments: Boisterous.  HENT:     Head: Normocephalic and atraumatic.     Right Ear: Tympanic membrane, ear canal and external ear normal.     Left Ear: Tympanic membrane, ear canal and external ear normal.     Nose: No nasal deformity, septal deviation, mucosal edema or rhinorrhea.     Right Turbinates: Enlarged and swollen.     Left Turbinates: Enlarged and swollen.     Right Sinus: No maxillary sinus tenderness or frontal sinus tenderness.     Left Sinus: No  maxillary sinus tenderness or frontal sinus tenderness.     Mouth/Throat:     Mouth: Mucous membranes are not pale and not dry.     Pharynx: Uvula midline.  Eyes:     General:        Right eye: No discharge.        Left eye: No discharge.     Conjunctiva/sclera: Conjunctivae normal.     Right eye: Right conjunctiva is not injected. No chemosis.    Left eye: Left conjunctiva is not injected. No chemosis.    Pupils: Pupils are equal, round, and reactive to light.  Cardiovascular:     Rate and Rhythm: Normal rate and regular rhythm.     Heart sounds: Normal heart sounds.  Pulmonary:     Effort: Pulmonary effort is normal. No tachypnea, accessory muscle usage or respiratory distress.     Breath sounds: Normal breath sounds.  No wheezing, rhonchi or rales.     Comments: Moving air well in all lung fields.  No increased work of breathing. Chest:     Chest wall: No tenderness.  Lymphadenopathy:     Cervical: No cervical adenopathy.  Skin:    Coloration: Skin is not pale.     Findings: No abrasion, erythema, petechiae or rash. Rash is not papular, urticarial or vesicular.  Neurological:     Mental Status: He is alert.      Diagnostic studies:    Spirometry: results abnormal (FEV1: 2.21/57%, FVC: 3.11/62%, FEV1/FVC: 71%).    Spirometry consistent with possible restrictive disease.   Allergy Studies: none       Malachi Bonds, MD  [facility]

## 2020-09-16 ENCOUNTER — Encounter (HOSPITAL_COMMUNITY): Payer: Self-pay

## 2020-09-16 ENCOUNTER — Emergency Department (HOSPITAL_COMMUNITY)
Admission: EM | Admit: 2020-09-16 | Discharge: 2020-09-16 | Disposition: A | Discharge status: Home / Self Care | Payer: Medicare HMO | Attending: Emergency Medicine | Admitting: Emergency Medicine

## 2020-09-16 ENCOUNTER — Other Ambulatory Visit: Payer: Self-pay

## 2020-09-16 DIAGNOSIS — F17210 Nicotine dependence, cigarettes, uncomplicated: Secondary | ICD-10-CM | POA: Insufficient documentation

## 2020-09-16 DIAGNOSIS — L02215 Cutaneous abscess of perineum: Secondary | ICD-10-CM | POA: Diagnosis not present

## 2020-09-16 DIAGNOSIS — R69 Illness, unspecified: Secondary | ICD-10-CM | POA: Diagnosis not present

## 2020-09-16 DIAGNOSIS — Z7951 Long term (current) use of inhaled steroids: Secondary | ICD-10-CM | POA: Insufficient documentation

## 2020-09-16 DIAGNOSIS — L02214 Cutaneous abscess of groin: Secondary | ICD-10-CM | POA: Diagnosis not present

## 2020-09-16 DIAGNOSIS — J45909 Unspecified asthma, uncomplicated: Secondary | ICD-10-CM | POA: Diagnosis not present

## 2020-09-16 DIAGNOSIS — L0291 Cutaneous abscess, unspecified: Secondary | ICD-10-CM

## 2020-09-16 MED ORDER — POVIDONE-IODINE 10 % EX SOLN
CUTANEOUS | Status: DC | PRN
  Filled 2020-09-16: qty 15

## 2020-09-16 MED ORDER — LIDOCAINE HCL (PF) 2 % IJ SOLN: 10.0000 mL | Freq: Once | INTRAMUSCULAR | Status: DC

## 2020-09-16 MED ORDER — LIDOCAINE HCL (PF) 2 % IJ SOLN
INTRAMUSCULAR | Status: AC
  Filled 2020-09-16: qty 10

## 2020-09-16 MED ORDER — HYDROCODONE-ACETAMINOPHEN 5-325 MG PO TABS: 1.0000 | ORAL_TABLET | Freq: Four times a day (QID) | ORAL | 0 refills | Status: DC | PRN

## 2020-09-16 MED ORDER — SULFAMETHOXAZOLE-TRIMETHOPRIM 800-160 MG PO TABS: 1.0000 | ORAL_TABLET | Freq: Two times a day (BID) | ORAL | 0 refills | Status: DC

## 2020-09-16 NOTE — ED Provider Notes (Signed)
[facility] EMERGENCY DEPARTMENT Provider Note   CSN: [id number] Arrival date & time: 09/16/20  8185     History Chief Complaint  Patient presents with  . Abscess    Tony Doyle is a 49 y.o. male.  The history is provided by the patient.  Abscess Location:  Pelvis Pelvic abscess location:  Groin Abscess quality: painful and redness   Abscess quality: not draining   Red streaking: no   Duration:  3 days Progression:  Worsening Pain details:    Quality:  Sharp and throbbing   Severity:  Severe   Duration:  3 days   Timing:  Constant   Progression:  Worsening Chronicity:  New Context: not diabetes, not immunosuppression, not injected drug use and not skin injury   Relieved by:  Nothing Ineffective treatments:  Warm compresses (boil ease cream) Associated symptoms: no fever, no nausea and no vomiting   Risk factors: prior abscess   Risk factors comment:  Reports pilonidal abscess about 10 years ago      Past Medical History:  Diagnosis Date  . Asthma   . COPD (chronic obstructive pulmonary disease) (HCC)   . Hypercholesterolemia   . Pneumonia     Patient Active Problem List   Diagnosis Date Noted  . Asthma with COPD (HCC) 09/12/2020  . Non-allergic rhinitis 09/12/2020  . Hyperlipidemia 12/13/2018  . Chest pain, musculoskeletal 02/22/2014  . Smoker 02/22/2014  . Foot contusion 08/23/2012    Past Surgical History:  Procedure Laterality Date  . arm surgery     LEFT  . LEG SURGERY     LEFT       Family History  Problem Relation Age of Onset  . Asthma Father   . Emphysema Father        smoked  . Asthma Mother   . Emphysema Mother        smoked  . Eczema Brother   . Allergic rhinitis Neg Hx   . Angioedema Neg Hx   . Atopy Neg Hx   . Immunodeficiency Neg Hx   . Urticaria Neg Hx     Social History   Tobacco Use  . Smoking status: Current Every Day Smoker    Packs/day: 1.00    Years: 33.00    Pack years: 33.00    Types: Cigarettes     Start date: 84  . Smokeless tobacco: Never Used  Vaping Use  . Vaping Use: Never used  Substance Use Topics  . Alcohol use: No  . Drug use: No    Home Medications Prior to Admission medications   Medication Sig Start Date End Date Taking? Authorizing Provider  albuterol (PROVENTIL) (2.5 MG/3ML) 0.083% nebulizer solution Take 3 mLs (2.5 mg total) by nebulization at bedtime. 09/12/20   Alfonse Spruce, MD  albuterol (VENTOLIN HFA) 108 (90 Base) MCG/ACT inhaler Inhale 2 puffs into the lungs every 6 (six) hours as needed for wheezing or shortness of breath. 04/27/20   Alfonse Spruce, MD  albuterol (VENTOLIN HFA) 108 (90 Base) MCG/ACT inhaler Inhale 2 puffs into the lungs every 4 (four) hours as needed for wheezing or shortness of breath. 04/27/20   Alfonse Spruce, MD  atorvastatin (LIPITOR) 10 MG tablet Take 1 tablet (10 mg total) by mouth daily. 02/07/20   Babs Sciara, MD  budesonide-formoterol (SYMBICORT) 160-4.5 MCG/ACT inhaler Inhale 2 puffs into the lungs 2 (two) times daily. Patient not taking: Reported on 09/12/2020 04/27/20   Alfonse Spruce, MD  buPROPion (WELLBUTRIN SR) 150 MG 12 hr tablet TAKE 1 TABLET BY MOUTH TWICE A DAY Patient not taking: Reported on 09/12/2020 08/31/20   Babs Sciara, MD  cetirizine (ZYRTEC) 10 MG tablet Take 1 tablet (10 mg total) by mouth daily. 04/27/20   Alfonse Spruce, MD  fluticasone [facility]) 50 MCG/ACT nasal spray SPRAY 2 SPRAYS INTO EACH NOSTRIL EVERY DAY 05/01/20   Babs Sciara, MD  fluticasone furoate-vilanterol (BREO ELLIPTA) 200-25 MCG/INH AEPB Inhale 1 puff into the lungs daily. 05/01/20   Alfonse Spruce, MD  HYDROcodone-acetaminophen (NORCO/VICODIN) 5-325 MG tablet Take 1 tablet by mouth every 6 (six) hours as needed for moderate pain. 09/16/20   Burgess Amor, PA-C  naproxen (NAPROSYN) 500 MG tablet 1 twice daily for chest wall pain for next 7 days then twice daily as needed 02/16/20   Babs Sciara, MD    sulfamethoxazole-trimethoprim (BACTRIM DS) 800-160 MG tablet Take 1 tablet by mouth 2 (two) times daily for 10 days. 09/16/20 09/26/20  Burgess Amor, PA-C    Allergies    Penicillins, Wellbutrin [bupropion], and Chantix [varenicline tartrate]  Review of Systems   Review of Systems  Constitutional: Negative for chills and fever.  Gastrointestinal: Negative for nausea and vomiting.  Skin: Positive for color change.       Negative except as mentioned in HPI.     Physical Exam Updated Vital Signs BP 119/78 (BP Location: Left Arm)   Pulse 66   Temp 97.7 F (36.5 C) (Oral)   Resp 18   Ht 5\' 8"  (1.727 m)   Wt 97.5 kg   SpO2 100%   BMI 32.69 kg/m   Physical Exam Vitals and nursing note reviewed. Exam conducted with a chaperone present.  Constitutional:      Appearance: He is well-developed.  HENT:     Head: Normocephalic and atraumatic.  Eyes:     Conjunctiva/sclera: Conjunctivae normal.  Cardiovascular:     Rate and Rhythm: Normal rate.  Pulmonary:     Effort: Pulmonary effort is normal.     Breath sounds: No wheezing.  Abdominal:     Palpations: Abdomen is soft.     Tenderness: There is no abdominal tenderness.  Genitourinary:    Comments: Indurated abscess left perineum at base but not invading the scrotum.  Erythema laterally to left perirectal buttock. No drainage. Musculoskeletal:        General: Normal range of motion.     Cervical back: Normal range of motion.  Skin:    General: Skin is warm and dry.  Neurological:     Mental Status: He is alert.     ED Results / Procedures / Treatments   Labs (all labs ordered are listed, but only abnormal results are displayed) Labs Reviewed - No data to display  EKG None  Radiology No results found.  Procedures Procedures (including critical care time)  INCISION AND DRAINAGE Performed by: Consent: Verbal consent obtained. Risks and benefits: risks, benefits and alternatives were discussed Type:  abscess  Body area: perineum  Anesthesia: local infiltration  Incision was made with a scalpel.  Local anesthetic: lidocaine 2% without epinephrine  Anesthetic total: 3 ml  Complexity: complex Blunt dissection to break up loculations  Drainage: purulent  Drainage amount: moderate  Packing material: none.  Flushed using NS until it flushed clear.  Patient tolerance: Patient tolerated the procedure well with no immediate complications.    Medications Ordered in ED Medications  povidone-iodine (BETADINE) 10 %  external solution (has no administration in time range)  lidocaine HCl (PF) (XYLOCAINE) 2 % injection 10 mL (has no administration in time range)  lidocaine HCl (PF) (XYLOCAINE) 2 % injection (has no administration in time range)    ED Course  I have reviewed the triage vital signs and the nursing notes.  Pertinent labs & imaging results that were available during my care of the patient were reviewed by me and considered in my medical decision making (see chart for details).    MDM Rules/Calculators/A&P                          Patient with perineal abscess which appears to be adequately I&D.  He was placed on Bactrim and hydrocodone, discussed home treatment including continued warm soaks.  Return precautions outlined, as needed follow-up anticipated. Final Clinical Impression(s) / ED Diagnoses Final diagnoses:  Abscess    Rx / DC Orders ED Discharge Orders         Ordered    HYDROcodone-acetaminophen (NORCO/VICODIN) 5-325 MG tablet  Every 6 hours PRN        09/16/20 0950    sulfamethoxazole-trimethoprim (BACTRIM DS) 800-160 MG tablet  2 times daily        09/16/20 0950           Burgess Amor, PA-C 09/16/20 1941    Vanetta Mulders, MD 09/17/20 2357

## 2020-09-16 NOTE — Discharge Instructions (Addendum)
Take the entire course of the antibiotic prescribed.  You may use the hydrocodone if needed for pain relief, this medication will make you drowsy so do not drive within 4 hours of taking this.  You may be more comfortable using a "donut pillow" as discussed for sitting.  Continue to do warm water soaks using Epsom salt at least twice daily to help encourage this to continue healing.  Get rechecked for any worsening symptoms.

## 2020-09-16 NOTE — ED Triage Notes (Signed)
Pt reports abscess on buttocks for past few days.

## 2020-09-19 ENCOUNTER — Other Ambulatory Visit: Payer: Self-pay

## 2020-09-19 ENCOUNTER — Encounter: Payer: Self-pay | Admitting: Allergy & Immunology

## 2020-09-19 ENCOUNTER — Ambulatory Visit (INDEPENDENT_AMBULATORY_CARE_PROVIDER_SITE_OTHER): Payer: Self-pay | Admitting: *Deleted

## 2020-09-19 VITALS — Ht 68.0 in | Wt 222.2 lb

## 2020-09-19 DIAGNOSIS — Z1211 Encounter for screening for malignant neoplasm of colon: Secondary | ICD-10-CM

## 2020-09-19 NOTE — Progress Notes (Signed)
Ok to schedule.  ASA II (COPD with recent normal spirometry, BMI >30 but < 40)

## 2020-09-19 NOTE — Patient Instructions (Signed)
[place]   Please notify us immediately if you are diabetic, take iron supplements, or if you are on coumadin or any blood thinners.   Patient Name: Takumi Din Date of procedure: 10/12/2020 Time to register at [place] Stay: 8:00 am Provider: Dr. Abbey Chatters   Purchase: MIRALAX 238 gram bottle, 1 FLEET ENEMA, 1 box of DULCOLAX (All over the counter medications)    10/10/2020- 2 Days prior to procedure: START CLEAR LIQUID DIET AFTER YOUR LUNCH MEAL--NO SOLID FOODS!   10/11/2020- 1 Day prior to procedure:   CLEAR LIQUIDS ALL DAY--NO SOLID FOODS!   Diabetic medication adjustments for today:    At 10:00 AM, take 2 DULCOLAX 87m tablets   At 12:00 PM, Mix 5 teaspoons of Miralax in any 4-6 ounces of CLEAR LIQUIDS (Gatorade) every hour for 5 hours until passing clear, watery stools. Be sure to drink 4 ounces of clear liquid 30 minutes after each dose of Miralax.   At 3:00 PM, take 2 Dulcolax 562mtablets   If stools are not clear & watery by 6:00 PM, take 5 teaspoons of Miralax every 30 minutes until stools are clear (no color)   You must have a complete prep to ensure the most effective cleaning.   CONTINUE CLEAR LIQUIDS ONLY UNTIL MIDNIGHT. Make a conscious effort to drink as much as you can before, during & after the preparation.    NOTHING TO EAT OR DRINK AFTER MIDNIGHT except for your heart, blood pressure & breathing medications. You may take them with a sip of clear liquids.    10/12/2020- Day of Procedure  Give yourself one Fleet enema about 1 hour prior to leaving for the hospital.   Diabetic medication adjustments for today:   You may take TYLENOL products. Please continue your regular medications unless we have instructed you otherwise.    Please note, on the day of your procedure you MUST be accompanied by an adult who is willing to assume responsibility for you at time of discharge. If you do not have such person with you, your  procedure will have to be rescheduled.                                                             Please leave ALL jewelry at home prior to coming to the hospital for your procedure.   *It is your responsibility to check with your insurance company for the benefits of coverage you have for this procedure. Unfortunately, not all insurance companies have benefits to cover all or part of these types of procedures. It is your responsibility to check your benefits, however we will be glad to assist you with any codes your insurance company may need.   Please note that most insurance companies will not cover a screening colonoscopy for people under the age of 5043For example, with some insurance companies you may have benefits for a screening colonoscopy, but if polyps are found the diagnosis will change and then you may have a deductible that will need to be met. Please make sure you check your benefits for screening colonoscopy as well as a diagnostic colonoscopy.    CLEAR LIQUIDS: (NO RED)  Jello Apple Juice White Grape Juice Water  Banana popsicles Kool-Aid Coffee(No cream or milk)  Tea (  No cream or milk) Soft drinks Broth (fat free beef/chicken/vegetable)   Clear liquids allow you to see your fingers on the other side of the glass. Be sure they are NOT RED in color, cloudy, but CLEAR.   Do Not Eat:  Dairy products of any kind Cranberry juice  Tomato or V8 Juice Orange Juice  Grapefruit Juice Red Grape Juice  Solid foods like cereal, oatmeal, yogurt, fruits, vegetables, creamed soups, eggs, bread, etc   HELPFUL HINTS TO MAKE DRINKING EASIER:  -Make sure prep is extremely COLD. Refrigerate the night before. You may also put in freezer.  -You may try mixing Crystal Light or Country Time Lemonade if you prefer. MIx in small amounts. Add more if necessary.  -Trying drinking through a straw.  -Rinse mouth with water or mouthwash  between glasses to remove aftertaste.  -Try sipping on a cold beverage/ice popsicles between glasses of prep.  -Place a piece of sugar-free hard candy in mouth between glasses.  -If you become nauseated, try consuming smaller amounts or stretch out the time between glasses. Stop for 30 minutes to an hour & slowly start back drinking.   Call our office with any questions or concerns at [phone].   Thank You

## 2020-09-19 NOTE — Progress Notes (Signed)
Gastroenterology Pre-Procedure Review  Request Date: 09/19/2020 Requesting Physician: Dr. Gerda Diss, no previous TCS  PATIENT REVIEW QUESTIONS: The patient responded to the following health history questions as indicated:    1. Diabetes Melitis: no 2. Joint replacements in the past 12 months: no 3. Major health problems in the past 3 months: no 4. Has an artificial valve or MVP: no 5. Has a defibrillator: no 6. Has been advised in past to take antibiotics in advance of a procedure like teeth cleaning: no 7. Family history of colon cancer: no  8. Alcohol Use: no 9. Illicit drug Use: no 10. History of sleep apnea: no  11. History of coronary artery or other vascular stents placed within the last 12 months: no 12. History of any prior anesthesia complications: no 13. Body mass index is 33.79 kg/m.    MEDICATIONS & ALLERGIES:    Patient reports the following regarding taking any blood thinners:   Plavix? no Aspirin? no Coumadin? no Brilinta? no Xarelto? no Eliquis? no Pradaxa? no Savaysa? no Effient? no  Patient confirms/reports the following medications:  Current Outpatient Medications  Medication Sig Dispense Refill  . albuterol (PROVENTIL) (2.5 MG/3ML) 0.083% nebulizer solution Take 3 mLs (2.5 mg total) by nebulization at bedtime. 90 mL 3  . albuterol (VENTOLIN HFA) 108 (90 Base) MCG/ACT inhaler Inhale 2 puffs into the lungs every 6 (six) hours as needed for wheezing or shortness of breath. 18 g 2  . atorvastatin (LIPITOR) 10 MG tablet Take 1 tablet (10 mg total) by mouth daily. 90 tablet 1  . cetirizine (ZYRTEC) 10 MG tablet Take 1 tablet (10 mg total) by mouth daily. 30 tablet 5  . fluticasone (FLONASE) 50 MCG/ACT nasal spray SPRAY 2 SPRAYS INTO EACH NOSTRIL EVERY DAY 48 mL 1  . fluticasone furoate-vilanterol (BREO ELLIPTA) 200-25 MCG/INH AEPB Inhale 1 puff into the lungs daily. 28 each 5  . HYDROcodone-acetaminophen (NORCO/VICODIN) 5-325 MG tablet Take 1 tablet by mouth  every 6 (six) hours as needed for moderate pain. (Patient taking differently: Take 1 tablet by mouth as needed for moderate pain. ) 12 tablet 0   No current facility-administered medications for this visit.    Patient confirms/reports the following allergies:  Allergies  Allergen Reactions  . Penicillins Other (See Comments)    Was told he was allergic, unknown reaction  . Wellbutrin [Bupropion]     dizziness  . Chantix [Varenicline Tartrate]     Severe dreams    No orders of the defined types were placed in this encounter.   AUTHORIZATION INFORMATION Primary Insurance: [employer],  ID #:[id number] ,  Group #: [id number] Pre-Cert / Berkley Harvey required: No, not required  SCHEDULE INFORMATION: Procedure has been scheduled as follows:  Date: 10/12/2020, Time: 9:30 Location: [facility] with Dr. Marletta Lor  This Gastroenterology Pre-Precedure Review Form is being routed to the following provider(s): Wynne Dust, NP

## 2020-09-19 NOTE — Progress Notes (Signed)
Pt wanted [place] to know that he had steel rods placed in left leg and steel plates in left arm in 1995.

## 2020-10-04 DIAGNOSIS — R69 Illness, unspecified: Secondary | ICD-10-CM | POA: Diagnosis not present

## 2020-10-10 ENCOUNTER — Other Ambulatory Visit (HOSPITAL_COMMUNITY)
Admission: RE | Admit: 2020-10-10 | Discharge: 2020-10-10 | Disposition: A | Discharge status: Home / Self Care | Payer: Medicare HMO | Source: Ambulatory Visit | Attending: Internal Medicine | Admitting: Internal Medicine

## 2020-10-10 ENCOUNTER — Other Ambulatory Visit: Payer: Self-pay

## 2020-10-10 DIAGNOSIS — Z20822 Contact with and (suspected) exposure to covid-19: Secondary | ICD-10-CM | POA: Diagnosis not present

## 2020-10-10 DIAGNOSIS — Z01812 Encounter for preprocedural laboratory examination: Secondary | ICD-10-CM | POA: Diagnosis not present

## 2020-10-11 LAB — SARS CORONAVIRUS 2 (TAT 6-24 HRS): SARS Coronavirus 2: NEGATIVE

## 2020-10-12 ENCOUNTER — Encounter (HOSPITAL_COMMUNITY): Payer: Self-pay

## 2020-10-12 ENCOUNTER — Ambulatory Visit (HOSPITAL_COMMUNITY): Payer: Medicare HMO | Admitting: Anesthesiology

## 2020-10-12 ENCOUNTER — Other Ambulatory Visit: Payer: Self-pay

## 2020-10-12 ENCOUNTER — Encounter (HOSPITAL_COMMUNITY)
Admission: RE | Disposition: A | Discharge status: Home / Self Care | Payer: Self-pay | Source: Home / Self Care | Attending: Internal Medicine

## 2020-10-12 ENCOUNTER — Ambulatory Visit (HOSPITAL_COMMUNITY)
Admission: RE | Admit: 2020-10-12 | Discharge: 2020-10-12 | Disposition: A | Discharge status: Home / Self Care | Payer: Medicare HMO | Attending: Internal Medicine | Admitting: Internal Medicine

## 2020-10-12 DIAGNOSIS — Z79899 Other long term (current) drug therapy: Secondary | ICD-10-CM | POA: Diagnosis not present

## 2020-10-12 DIAGNOSIS — K648 Other hemorrhoids: Secondary | ICD-10-CM | POA: Insufficient documentation

## 2020-10-12 DIAGNOSIS — Z1211 Encounter for screening for malignant neoplasm of colon: Secondary | ICD-10-CM | POA: Diagnosis not present

## 2020-10-12 DIAGNOSIS — Z7951 Long term (current) use of inhaled steroids: Secondary | ICD-10-CM | POA: Diagnosis not present

## 2020-10-12 DIAGNOSIS — F17210 Nicotine dependence, cigarettes, uncomplicated: Secondary | ICD-10-CM | POA: Insufficient documentation

## 2020-10-12 DIAGNOSIS — R69 Illness, unspecified: Secondary | ICD-10-CM | POA: Diagnosis not present

## 2020-10-12 DIAGNOSIS — J449 Chronic obstructive pulmonary disease, unspecified: Secondary | ICD-10-CM | POA: Diagnosis not present

## 2020-10-12 HISTORY — PX: COLONOSCOPY WITH PROPOFOL: SHX5780

## 2020-10-12 SURGERY — COLONOSCOPY WITH PROPOFOL
Anesthesia: General

## 2020-10-12 MED ORDER — LACTATED RINGERS IV SOLN: INTRAVENOUS | Status: DC | PRN

## 2020-10-12 MED ORDER — STERILE WATER FOR IRRIGATION IR SOLN
Status: DC | PRN
  Administered 2020-10-12: 100 mL

## 2020-10-12 MED ORDER — LIDOCAINE HCL (CARDIAC) PF 100 MG/5ML IV SOSY
PREFILLED_SYRINGE | INTRAVENOUS | Status: DC | PRN
  Administered 2020-10-12: 100 mg via INTRAVENOUS

## 2020-10-12 MED ORDER — PROPOFOL 10 MG/ML IV BOLUS
INTRAVENOUS | Status: DC | PRN
  Administered 2020-10-12: 100 mg via INTRAVENOUS

## 2020-10-12 MED ORDER — CHLORHEXIDINE GLUCONATE CLOTH 2 % EX PADS: 6.0000 | MEDICATED_PAD | Freq: Once | CUTANEOUS | Status: DC

## 2020-10-12 MED ORDER — PROPOFOL 500 MG/50ML IV EMUL
INTRAVENOUS | Status: DC | PRN
  Administered 2020-10-12: 150 ug/kg/min via INTRAVENOUS

## 2020-10-12 MED ORDER — LACTATED RINGERS IV SOLN: INTRAVENOUS | Status: DC

## 2020-10-12 NOTE — Anesthesia Procedure Notes (Signed)
Date/Time: 10/12/2020 9:16 AM Performed by: Julian Reil, CRNA Pre-anesthesia Checklist: Patient identified, Emergency Drugs available, Suction available and Patient being monitored Patient Re-evaluated:Patient Re-evaluated prior to induction Oxygen Delivery Method: Nasal cannula Induction Type: IV induction Placement Confirmation: positive ETCO2

## 2020-10-12 NOTE — H&P (Signed)
Primary Care Physician:  Babs Sciara, MD Primary Gastroenterologist:  Dr. Marletta Lor  Pre-Procedure History & Physical: HPI:  Tony Doyle is a 49 y.o. male is here for a colonoscopy for colon cancer screening purposes.  Patient denies any family history of colorectal cancer.  No melena or hematochezia.  No abdominal pain or unintentional weight loss.  No change in bowel habits.  Overall feels well from a GI standpoint.  Past Medical History:  Diagnosis Date  . Asthma   . COPD (chronic obstructive pulmonary disease) (HCC)   . Hypercholesterolemia   . Pneumonia     Past Surgical History:  Procedure Laterality Date  . arm surgery     LEFT  . LEG SURGERY     LEFT    Prior to Admission medications   Medication Sig Start Date End Date Taking? Authorizing Provider  acetaminophen (TYLENOL) 500 MG tablet Take 2,000 mg by mouth every 6 (six) hours as needed for mild pain or moderate pain.   Yes [provider]  albuterol (PROVENTIL) (2.5 MG/3ML) 0.083% nebulizer solution Take 3 mLs (2.5 mg total) by nebulization at bedtime. Patient taking differently: Take 2.5 mg by nebulization at bedtime. As needed during the day 09/12/20  Yes Alfonse Spruce, MD  albuterol (VENTOLIN HFA) 108 (90 Base) MCG/ACT inhaler Inhale 2 puffs into the lungs every 6 (six) hours as needed for wheezing or shortness of breath. 04/27/20  Yes Alfonse Spruce, MD  atorvastatin (LIPITOR) 10 MG tablet Take 1 tablet (10 mg total) by mouth daily. Patient taking differently: Take 10 mg by mouth at bedtime.  02/07/20  Yes Babs Sciara, MD  cetirizine (ZYRTEC) 10 MG tablet Take 1 tablet (10 mg total) by mouth daily. Patient taking differently: Take 10 mg by mouth at bedtime.  04/27/20  Yes Alfonse Spruce, MD  fluticasone [facility]) 50 MCG/ACT nasal spray SPRAY 2 SPRAYS INTO EACH NOSTRIL EVERY DAY Patient taking differently: Place 2 sprays into both nostrils daily as needed for rhinitis.  05/01/20  Yes  Luking, Scott A, MD  fluticasone furoate-vilanterol (BREO ELLIPTA) 200-25 MCG/INH AEPB Inhale 1 puff into the lungs daily. 05/01/20  Yes Alfonse Spruce, MD  HYDROcodone-acetaminophen (NORCO/VICODIN) 5-325 MG tablet Take 1 tablet by mouth every 6 (six) hours as needed for moderate pain. Patient taking differently: Take 1 tablet by mouth every 4 (four) hours as needed for moderate pain.  09/16/20  Yes IdolRaynelle Fanning, PA-C    Allergies as of 09/20/2020 - Review Complete 09/19/2020  Allergen Reaction Noted  . Penicillins Other (See Comments) 06/30/2017  . Wellbutrin [bupropion]  09/12/2020  . Chantix [varenicline tartrate]  12/13/2018    Family History  Problem Relation Age of Onset  . Asthma Father   . Emphysema Father        smoked  . Asthma Mother   . Emphysema Mother        smoked  . Eczema Brother   . Allergic rhinitis Neg Hx   . Angioedema Neg Hx   . Atopy Neg Hx   . Immunodeficiency Neg Hx   . Urticaria Neg Hx     Social History   Socioeconomic History  . Marital status: Married    Spouse name: Not on file  . Number of children: Not on file  . Years of education: 8  . Highest education level: Not on file  Occupational History  . 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 Smoking status:  Current Every Day Smoker    Packs/day: 1.00    Years: 33.00    Pack years: 33.00    Types: Cigarettes    Start date: 49  . Smokeless tobacco: Never Used  Vaping Use  . Vaping Use: Never used  Substance and Sexual Activity  . Alcohol use: No  . Drug use: No  . Sexual activity: Not on file  Other Topics Concern  . Not on file  Social History Narrative  . Not on file   Social Determinants of Health   Financial Resource Strain:   . Difficulty of Paying Living Expenses: Not on file  Food Insecurity:   . Worried About Programme researcher, broadcasting/film/video in the Last Year: Not on file  . Ran Out of Food in the Last Year: Not on file  Transportation Needs:    . Lack of Transportation (Medical): Not on file  . Lack of Transportation (Non-Medical): Not on file  Physical Activity:   . Days of Exercise per Week: Not on file  . Minutes of Exercise per Session: Not on file  Stress:   . Feeling of Stress : Not on file  Social Connections:   . Frequency of Communication with Friends and Family: Not on file  . Frequency of Social Gatherings with Friends and Family: Not on file  . Attends Religious Services: Not on file  . Active Member of Clubs or Organizations: Not on file  . Attends Banker Meetings: Not on file  . Marital Status: Not on file  Intimate Partner Violence:   . Fear of Current or Ex-Partner: Not on file  . Emotionally Abused: Not on file  . Physically Abused: Not on file  . Sexually Abused: Not on file    Review of Systems: See HPI, otherwise negative ROS  Impression/Plan: Tony Doyle is here for a colonoscopy to be performed for colon cancer screening purposes.  The risks of the procedure including infection, bleed, or perforation as well as benefits, limitations, alternatives and imponderables have been reviewed with the patient. Questions have been answered. All parties agreeable.

## 2020-10-12 NOTE — Anesthesia Preprocedure Evaluation (Signed)
Anesthesia Evaluation  Patient identified by MRN, date of birth, ID band Patient awake    Reviewed: Allergy & Precautions, H&P , NPO status , Patient's Chart, lab work & pertinent test results, reviewed documented beta blocker date and time   Airway Mallampati: II  TM Distance: >3 FB Neck ROM: full    Dental no notable dental hx. (+) Teeth Intact   Pulmonary asthma , pneumonia, COPD,  COPD inhaler, Current Smoker,    Pulmonary exam normal breath sounds clear to auscultation       Cardiovascular Exercise Tolerance: Good negative cardio ROS   Rhythm:regular Rate:Normal     Neuro/Psych negative neurological ROS  negative psych ROS   GI/Hepatic negative GI ROS, Neg liver ROS,   Endo/Other  negative endocrine ROS  Renal/GU negative Renal ROS  negative genitourinary   Musculoskeletal   Abdominal   Peds  Hematology negative hematology ROS (+)   Anesthesia Other Findings   Reproductive/Obstetrics negative OB ROS                             Anesthesia Physical Anesthesia Plan  ASA: III  Anesthesia Plan: General   Post-op Pain Management:    Induction:   PONV Risk Score and Plan: Propofol infusion  Airway Management Planned:   Additional Equipment:   Intra-op Plan:   Post-operative Plan:   Informed Consent: I have reviewed the patients History and Physical, chart, labs and discussed the procedure including the risks, benefits and alternatives for the proposed anesthesia with the patient or authorized representative who has indicated his/her understanding and acceptance.     Dental Advisory Given  Plan Discussed with: CRNA  Anesthesia Plan Comments:         Anesthesia Quick Evaluation

## 2020-10-12 NOTE — Anesthesia Postprocedure Evaluation (Signed)
Anesthesia Post Note  Patient: Tony Doyle  Procedure(s) Performed: COLONOSCOPY WITH PROPOFOL (N/A )  Patient location during evaluation: Endoscopy Anesthesia Type: General Level of consciousness: awake and alert and oriented Pain management: pain level controlled Vital Signs Assessment: post-procedure vital signs reviewed and stable Respiratory status: spontaneous breathing, nonlabored ventilation and respiratory function stable Cardiovascular status: blood pressure returned to baseline and stable Postop Assessment: no apparent nausea or vomiting Anesthetic complications: no   No complications documented.   Last Vitals:  Vitals:   10/12/20 0830 10/12/20 0926  BP: 113/75 (!) 94/56  Pulse: 60   Resp: 12 (!) 23  Temp: 36.6 C 36.6 C  SpO2: 98% 97%    Last Pain:  Vitals:   10/12/20 0926  TempSrc: Oral  PainSc: 0-No pain                 Julian Reil

## 2020-10-12 NOTE — Op Note (Signed)
[facility] Patient Name: Tony Doyle Procedure Date: 10/12/2020 9:03 AM MRN: [id number] Date of Birth: 1971/07/19 Attending MD: Elon Alas. Abbey Chatters DO CSN: [id number] Age: 49 Admit Type: Outpatient Procedure:                Colonoscopy Indications:              Screening for colorectal malignant neoplasm Providers:                Elon Alas. Abbey Chatters, DO, Lambert Mody, Janeece Riggers, RN, Aram Candela Referring MD:              Medicines:                See the Anesthesia note for documentation of the                            administered medications Complications:            No immediate complications. Estimated Blood Loss:     Estimated blood loss: none. Procedure:                Pre-Anesthesia Assessment:                           - The anesthesia plan was to use monitored                            anesthesia care (MAC).                           After obtaining informed consent, the colonoscope                            was passed under direct vision. Throughout the                            procedure, the patient's blood pressure, pulse, and                            oxygen saturations were monitored continuously. The                            [id number]-HQ190L ([id number]) scope was introduced through                            the anus and advanced to the the terminal ileum,                            with identification of the appendiceal orifice and                            IC valve. The colonoscopy was performed without                            difficulty. The patient tolerated the  procedure                            well. The quality of the bowel preparation was                            evaluated using the BBPS [facility] Bowel Preparation                            Scale) with scores of: Right Colon = 2 (minor                            amount of residual staining, small fragments of                            stool and/or opaque liquid,  but mucosa seen well),                            Transverse Colon = 3 (entire mucosa seen well with                            no residual staining, small fragments of stool or                            opaque liquid) and Left Colon = 3 (entire mucosa                            seen well with no residual staining, small                            fragments of stool or opaque liquid). The total                            BBPS score equals 8. The quality of the bowel                            preparation was good. Scope In: 9:15:10 AM Scope Out: 9:23:52 AM Scope Withdrawal Time: 0 hours 7 minutes 22 seconds  Total Procedure Duration: 0 hours 8 minutes 42 seconds  Findings:      The perianal and digital rectal examinations were normal.      Non-bleeding internal hemorrhoids were found during endoscopy.      The terminal ileum appeared normal.      The exam was otherwise without abnormality. Impression:               - Preparation of the colon was fair.                           - Non-bleeding internal hemorrhoids.                           - The examined portion of the ileum was normal.                           -  The examination was otherwise normal.                           - No specimens collected. Moderate Sedation:      Per Anesthesia Care Recommendation:           - Patient has a contact number available for                            emergencies. The signs and symptoms of potential                            delayed complications were discussed with the                            patient. Return to normal activities tomorrow.                            Written discharge instructions were provided to the                            patient.                           - Resume previous diet.                           - Continue present medications.                           - Repeat colonoscopy in 10 years for screening                            purposes.                            - Return to GI clinic PRN. Procedure Code(s):        --- Professional ---                           V2536, Colorectal cancer screening; colonoscopy on                            individual not meeting criteria for high risk Diagnosis Code(s):        --- Professional ---                           Z12.11, Encounter for screening for malignant                            neoplasm of colon                           K64.8, Other hemorrhoids CPT copyright 2019 American Medical Association. All rights reserved. The codes documented in this report are preliminary and upon coder review may  be revised to meet current compliance requirements. Elon Alas. Abbey Chatters, DO [place] Abbey Chatters, DO  10/12/2020 9:27:23 AM This report has been signed electronically. Number of Addenda: 0

## 2020-10-12 NOTE — Transfer of Care (Signed)
Immediate Anesthesia Transfer of Care Note  Patient: Tony Doyle  Procedure(s) Performed: COLONOSCOPY WITH PROPOFOL (N/A )  Patient Location: Endoscopy Unit  Anesthesia Type:General  Level of Consciousness: drowsy  Airway & Oxygen Therapy: Patient Spontanous Breathing  Post-op Assessment: Report given to RN and Post -op Vital signs reviewed and stable  Post vital signs: Reviewed and stable  Last Vitals:  Vitals Value Taken Time  BP    Temp    Pulse    Resp    SpO2      Last Pain:  Vitals:   10/12/20 0910  TempSrc:   PainSc: 0-No pain      Patients Stated Pain Goal: 6 (10/12/20 0830)  Complications: No complications documented.

## 2020-10-12 NOTE — Discharge Instructions (Addendum)
  Colonoscopy Discharge Instructions  Read the instructions outlined below and refer to this sheet in the next few weeks. These discharge instructions provide you with general information on caring for yourself after you leave the hospital. Your doctor may also give you specific instructions. While your treatment has been planned according to the most current medical practices available, unavoidable complications occasionally occur.   ACTIVITY  You may resume your regular activity, but move at a slower pace for the next 24 hours.   Take frequent rest periods for the next 24 hours.   Walking will help get rid of the air and reduce the bloated feeling in your belly (abdomen).   No driving for 24 hours (because of the medicine (anesthesia) used during the test).    Do not sign any important legal documents or operate any machinery for 24 hours (because of the anesthesia used during the test).  NUTRITION  Drink plenty of fluids.   You may resume your normal diet as instructed by your doctor.   Begin with a light meal and progress to your normal diet. Heavy or fried foods are harder to digest and may make you feel sick to your stomach (nauseated).   Avoid alcoholic beverages for 24 hours or as instructed.  MEDICATIONS  You may resume your normal medications unless your doctor tells you otherwise.  WHAT YOU CAN EXPECT TODAY  Some feelings of bloating in the abdomen.   Passage of more gas than usual.   Spotting of blood in your stool or on the toilet paper.  IF YOU HAD POLYPS REMOVED DURING THE COLONOSCOPY:  No aspirin products for 7 days or as instructed.   No alcohol for 7 days or as instructed.   Eat a soft diet for the next 24 hours.  FINDING OUT THE RESULTS OF YOUR TEST Not all test results are available during your visit. If your test results are not back during the visit, make an appointment with your caregiver to find out the results. Do not assume everything is normal if  you have not heard from your caregiver or the medical facility. It is important for you to follow up on all of your test results.  SEEK IMMEDIATE MEDICAL ATTENTION IF:  You have more than a spotting of blood in your stool.   Your belly is swollen (abdominal distention).   You are nauseated or vomiting.   You have a temperature over 101.   You have abdominal pain or discomfort that is severe or gets worse throughout the day.   Your colonoscopy was relatively unremarkable.  I did not find any polyps or evidence of colon cancer.  You have internal hemorrhoids which are likely inflamed due to colon preparation.  I recommend we repeat colonoscopy in 10 years for screening purposes.  Follow-up with GI as needed.  I hope you have a great rest of your week!  Hennie Duos. Marletta Lor, D.O. Gastroenterology and Hepatology [facility]

## 2020-10-17 ENCOUNTER — Other Ambulatory Visit: Payer: Self-pay | Admitting: Allergy & Immunology

## 2020-10-18 ENCOUNTER — Encounter (HOSPITAL_COMMUNITY): Payer: Self-pay | Admitting: Internal Medicine

## 2020-10-20 ENCOUNTER — Other Ambulatory Visit: Payer: Self-pay | Admitting: Allergy & Immunology

## 2020-10-30 DIAGNOSIS — R69 Illness, unspecified: Secondary | ICD-10-CM | POA: Diagnosis not present

## 2020-11-19 ENCOUNTER — Other Ambulatory Visit: Payer: Self-pay | Admitting: Family Medicine

## 2020-11-25 DIAGNOSIS — R69 Illness, unspecified: Secondary | ICD-10-CM | POA: Diagnosis not present

## 2021-01-14 ENCOUNTER — Encounter: Payer: Self-pay | Admitting: Family Medicine

## 2021-01-14 ENCOUNTER — Other Ambulatory Visit: Payer: Self-pay

## 2021-01-14 ENCOUNTER — Telehealth (INDEPENDENT_AMBULATORY_CARE_PROVIDER_SITE_OTHER): Payer: Medicare HMO | Admitting: Family Medicine

## 2021-01-14 DIAGNOSIS — J988 Other specified respiratory disorders: Secondary | ICD-10-CM | POA: Diagnosis not present

## 2021-01-14 DIAGNOSIS — B9789 Other viral agents as the cause of diseases classified elsewhere: Secondary | ICD-10-CM

## 2021-01-14 DIAGNOSIS — J069 Acute upper respiratory infection, unspecified: Secondary | ICD-10-CM | POA: Insufficient documentation

## 2021-01-14 NOTE — Progress Notes (Signed)
Patient ID: Tony Doyle, male    DOB: 1971-04-08, 50 y.o.   MRN: [id number]   Chief Complaint  Patient presents with  . Sinusitis   Subjective:  CC: cough, head congestion  This is a new problem.  Presents today via telephone visit with a complaint of cough with green mucus.  "I have a head cold "symptoms have been present for 2 to 3 days.  Associated symptoms include congestion, cough, sinus pain and pressure.  He denies fever, chills, fatigue some headaches with the sinus pain and pressure.  Has tried over-the-counter cough medicine, Hall's cough drops, and over-the-counter cold pills.  He reports that his wife and daughter have recently taking a Covid test and it was negative, he has not taken a Covid test for the last 2 weeks.   Patient presents today with respiratory illness Number of days present- 2 days   Symptoms include- head is stopped up, cough - green mucus  Presence of worrisome signs (severe shortness of breath, lethargy, etc.) - none  Recent/current visit to urgent care or ER- none  Recent direct exposure to Covid- none  Any current Covid testing- none  Virtual Visit via Telephone Note  I connected with Tony Doyle on 01/14/21 at  9:30 AM EST by telephone and verified that I am speaking with the correct person using two identifiers.  Location: Patient: home Provider: office   I discussed the limitations, risks, security and privacy concerns of performing an evaluation and management service by telephone and the availability of in person appointments. I also discussed with the patient that there may be a patient responsible charge related to this service. The patient expressed understanding and agreed to proceed.   History of Present Illness:    Observations/Objective:   Assessment and Plan:   Follow Up Instructions:    I discussed the assessment and treatment plan with the patient. The patient was provided an opportunity to ask questions and all  were answered. The patient agreed with the plan and demonstrated an understanding of the instructions.   The patient was advised to call back or seek an in-person evaluation if the symptoms worsen or if the condition fails to improve as anticipated.  I provided 9 minutes of non-face-to-face time during this encounter.       Medical History Tony Doyle has a past medical history of Asthma, COPD (chronic obstructive pulmonary disease) (HCC), Hypercholesterolemia, and Pneumonia.   Outpatient Encounter Medications as of 01/14/2021  Medication Sig  . acetaminophen (TYLENOL) 500 MG tablet Take 2,000 mg by mouth every 6 (six) hours as needed for mild pain or moderate pain.  Marland Kitchen albuterol (PROVENTIL) (2.5 MG/3ML) 0.083% nebulizer solution Take 3 mLs (2.5 mg total) by nebulization at bedtime. (Patient taking differently: Take 2.5 mg by nebulization at bedtime. As needed during the day)  . albuterol (VENTOLIN HFA) 108 (90 Base) MCG/ACT inhaler Inhale 2 puffs into the lungs every 6 (six) hours as needed for wheezing or shortness of breath.  Marland Kitchen atorvastatin (LIPITOR) 10 MG tablet TAKE 1 TABLET BY MOUTH EVERY DAY  . BREO ELLIPTA 200-25 MCG/INH AEPB TAKE 1 PUFF BY MOUTH EVERY DAY  . cetirizine (ZYRTEC) 10 MG tablet TAKE 1 TABLET BY MOUTH EVERY DAY  . fluticasone (FLONASE) 50 MCG/ACT nasal spray SPRAY 2 SPRAYS INTO EACH NOSTRIL EVERY DAY (Patient taking differently: Place 2 sprays into both nostrils daily as needed for rhinitis.)  . HYDROcodone-acetaminophen (NORCO/VICODIN) 5-325 MG tablet Take 1 tablet by mouth every 6 (  six) hours as needed for moderate pain. (Patient taking differently: Take 1 tablet by mouth every 4 (four) hours as needed for moderate pain.)   No facility-administered encounter medications on file as of 01/14/2021.     Review of Systems  Constitutional: Negative for chills, fatigue and fever.  HENT: Positive for congestion, rhinorrhea, sinus pressure and sinus pain.   Respiratory: Positive for  cough. Negative for shortness of breath.   Cardiovascular: Negative for chest pain.  Gastrointestinal: Negative for abdominal pain, diarrhea, nausea and vomiting.  Musculoskeletal: Negative for myalgias.  Neurological: Positive for headaches.     Vitals There were no vitals taken for this visit. unable Objective:   Physical Exam Unable, able to converse throughout telephone visit without obvious shortness of breath.  Assessment and Plan   1. Viral respiratory illness   Upper respiratory infection, no fever, no chills.  Recommend COVID test, to be sure.  Recommend supportive therapy, adequate hydration and to continue with over-the-counter symptom relief medications.  Recommend nasal saline flushes, and for him to continue using his Flonase as prescribed.  He will let us know if his symptoms do not improve.  Explained antibiotic therapy is not indicated at this time.   Agrees with plan of care discussed today. Understands warning signs to seek further care: chest pain, shortness of breath, any significant change in health.  Understands to follow-up if symptoms worsen, do not improve.  Recommend Covid testing.  Supportive therapy.    Dorena Bodo, NP 01/14/2021

## 2021-02-01 DIAGNOSIS — Z79899 Other long term (current) drug therapy: Secondary | ICD-10-CM | POA: Diagnosis not present

## 2021-02-01 DIAGNOSIS — R030 Elevated blood-pressure reading, without diagnosis of hypertension: Secondary | ICD-10-CM | POA: Diagnosis not present

## 2021-02-01 DIAGNOSIS — E669 Obesity, unspecified: Secondary | ICD-10-CM | POA: Diagnosis not present

## 2021-02-01 DIAGNOSIS — J301 Allergic rhinitis due to pollen: Secondary | ICD-10-CM | POA: Diagnosis not present

## 2021-02-01 DIAGNOSIS — R69 Illness, unspecified: Secondary | ICD-10-CM | POA: Diagnosis not present

## 2021-02-01 DIAGNOSIS — Z6836 Body mass index (BMI) 36.0-36.9, adult: Secondary | ICD-10-CM | POA: Diagnosis not present

## 2021-02-01 DIAGNOSIS — J449 Chronic obstructive pulmonary disease, unspecified: Secondary | ICD-10-CM | POA: Diagnosis not present

## 2021-02-01 DIAGNOSIS — Z7951 Long term (current) use of inhaled steroids: Secondary | ICD-10-CM | POA: Diagnosis not present

## 2021-02-01 DIAGNOSIS — E785 Hyperlipidemia, unspecified: Secondary | ICD-10-CM | POA: Diagnosis not present

## 2021-02-12 ENCOUNTER — Other Ambulatory Visit: Payer: Self-pay | Admitting: Family Medicine

## 2021-02-12 DIAGNOSIS — Z79899 Other long term (current) drug therapy: Secondary | ICD-10-CM

## 2021-02-12 DIAGNOSIS — Z125 Encounter for screening for malignant neoplasm of prostate: Secondary | ICD-10-CM

## 2021-02-12 DIAGNOSIS — E7849 Other hyperlipidemia: Secondary | ICD-10-CM

## 2021-02-13 NOTE — Telephone Encounter (Signed)
Patient needs PSA, lipid, liver, met 7 may have 90-day on his medication needs follow-up office visit

## 2021-03-12 NOTE — Patient Instructions (Addendum)
Moderate persistent asthma Stop Breo 200 mcg since it caused wheezing and making you feel sick Start Breztri 2 puffs twice a day with spacer to help prevent cough and wheeze. 2 samples given Continue albuterol via nebulizer once at night May use albuterol 2 puffs every 4 hours as needed for cough, wheeze, tightness in chest, or shortness of breath. Also, may use albuterol 2 puffs 5-15 minutes prior to exercise. Discussed smoking cessation. Handout given Asthma control goals:   Full participation in all desired activities (may need albuterol before activity)  Albuterol use two time or less a week on average (not counting use with activity)  Cough interfering with sleep two time or less a month  Oral steroids no more than once a year  No hospitalizations  Chronic non-allergic rhinitis Continue Flonase (fluticasone) 1 spray each nostril once a day for stuffy nose Continue Zyrtec (cetirizine) 10 mg once a day during the worst time of the year only May use saline nasal spray or saline nasal rinse as needed. Use this prior to any medicated nasal sprays  Please let us know if this treatment plan is not working well for you. Schedule a follow up appointment in 2 months

## 2021-03-13 ENCOUNTER — Other Ambulatory Visit: Payer: Self-pay

## 2021-03-13 ENCOUNTER — Encounter: Payer: Self-pay | Admitting: Family

## 2021-03-13 ENCOUNTER — Ambulatory Visit (INDEPENDENT_AMBULATORY_CARE_PROVIDER_SITE_OTHER): Payer: Medicare HMO | Admitting: Family

## 2021-03-13 ENCOUNTER — Other Ambulatory Visit: Payer: Self-pay | Admitting: Family

## 2021-03-13 VITALS — BP 122/60 | HR 71 | Temp 98.7°F | Resp 17 | Ht 68.0 in | Wt 228.8 lb

## 2021-03-13 DIAGNOSIS — J4540 Moderate persistent asthma, uncomplicated: Secondary | ICD-10-CM

## 2021-03-13 DIAGNOSIS — J310 Chronic rhinitis: Secondary | ICD-10-CM | POA: Diagnosis not present

## 2021-03-13 DIAGNOSIS — R69 Illness, unspecified: Secondary | ICD-10-CM | POA: Diagnosis not present

## 2021-03-13 DIAGNOSIS — F17200 Nicotine dependence, unspecified, uncomplicated: Secondary | ICD-10-CM

## 2021-03-13 MED ORDER — BREZTRI AEROSPHERE 160-9-4.8 MCG/ACT IN AERO: 2.0000 | INHALATION_SPRAY | Freq: Two times a day (BID) | RESPIRATORY_TRACT | 5 refills | Status: DC

## 2021-03-13 NOTE — Progress Notes (Signed)
[address] [place] [address] Dept: [phone]  FOLLOW UP NOTE  Patient ID: Tony Doyle, male    DOB: 05/31/1971  Age: 50 y.o. MRN: [id number] Date of Office Visit: 03/13/2021  Assessment  Chief Complaint: Asthma  HPI Tony Doyle is a 50 year old male who presents today for follow-up of moderate persistent asthma, chronic rhinitis, and current smoker.  He was last seen on September 12, 2020 by Dr. Dellis Anes.    Moderate persistent asthma is reported as not well controlled with albuterol as needed.  He stopped using Breo 200 mcg 1 puff once a day approximately a month and a half ago due to it making him feel sick and wheezy.  He reports some wheezing and some nocturnal awakenings due to breathing problems.  He denies any coughing, tightness in his chest, and shortness of breath.  Since his last office visit he has not required any systemic steroids or made any trips to the emergency room or urgent care due to breathing problems.  He is using his albuterol inhaler 1-2 times a day.  He continues to smoke half a pack a day.  He has tried Chantix and reports that it caused him to have wild dreams/nightmares.  He has had 3 COVID-19 vaccines.  He is not sure which one he had.  Chronic nonallergic rhinitis is reported as moderately controlled with Flonase 1 spray each nostril once a day, and Zyrtec 10 mg once a day.  He does not use any saline nasal spray or saline rinses.  He reports approximately 2 to 3 weeks ago he had yellow rhinorrhea, but this is now clearing up and is a light color.  He also reports some nasal congestion at night and denies any postnasal drip, sinus tenderness, fever or chills.  He has not had any sinus infections since we last saw him.  Drug Allergies:  Allergies  Allergen Reactions  . Wellbutrin [Bupropion]     dizziness  . Chantix [Varenicline Tartrate]     Severe dreams  . Penicillins Rash and Other (See Comments)    Was told he was allergic,  unknown reaction Childhood/ Tried a year ago    Review of Systems: Review of Systems  Constitutional: Negative for chills and fever.  HENT:       Reports rhinorrhea that was yellow 2 to 3 weeks ago, but is now clearing up.  Reports nasal congestion sometimes at night.  Denies sinus tenderness and postnasal drip.  He has not had any sinus infections since his last office visit.  Eyes:       Denies itchy watery eyes  Respiratory: Positive for wheezing. Negative for cough and shortness of breath.   Cardiovascular: Negative for chest pain and palpitations.  Gastrointestinal: Negative for heartburn.  Genitourinary: Negative for dysuria.  Skin: Negative for itching and rash.  Neurological: Negative for headaches.     Physical Exam: BP 122/60 (BP Location: Left Arm, Patient Position: Sitting, Cuff Size: Large)   Pulse 71   Temp 98.7 F (37.1 C) (Temporal)   Resp 17   Ht 5\' 8"  (1.727 m)   Wt 228 lb 12.8 oz (103.8 kg)   SpO2 97%   BMI 34.79 kg/m    Physical Exam Constitutional:      Appearance: Normal appearance.  HENT:     Head: Normocephalic and atraumatic.     Comments: Pharynx normal, eyes normal, ears normal, nose bilateral lower turbinate mildly edematous and slightly erythematous with no  drainage noted    Right Ear: Tympanic membrane, ear canal and external ear normal.     Left Ear: Tympanic membrane, ear canal and external ear normal.     Mouth/Throat:     Mouth: Mucous membranes are moist.     Pharynx: Oropharynx is clear.  Eyes:     Conjunctiva/sclera: Conjunctivae normal.  Cardiovascular:     Rate and Rhythm: Regular rhythm.     Heart sounds: Normal heart sounds.  Pulmonary:     Effort: Pulmonary effort is normal.     Breath sounds: Normal breath sounds.     Comments: Lungs clear to auscultation Musculoskeletal:     Cervical back: Neck supple.  Skin:    General: Skin is warm.  Neurological:     Mental Status: He is alert and oriented to person, place, and  time.  Psychiatric:        Mood and Affect: Mood normal.        Behavior: Behavior normal.        Thought Content: Thought content normal.        Judgment: Judgment normal.     Diagnostics: FVC 2.93 L, FEV1 2.07 L.  Predicted FVC 4.72 L, FEV1 3.66 L.  Spirometry indicates moderate restriction.  Spirometry is consistent with previous spirometry  Assessment and Plan: 1. Not well controlled moderate persistent asthma   2. Chronic rhinitis   3. Smoker     No orders of the defined types were placed in this encounter.   Patient Instructions  Moderate persistent asthma Stop Breo 200 mcg since it caused wheezing and making you feel sick Start Breztri 2 puffs twice a day with spacer to help prevent cough and wheeze. 2 samples given Continue albuterol via nebulizer once at night May use albuterol 2 puffs every 4 hours as needed for cough, wheeze, tightness in chest, or shortness of breath. Also, may use albuterol 2 puffs 5-15 minutes prior to exercise. Discussed smoking cessation. Handout given Asthma control goals:   Full participation in all desired activities (may need albuterol before activity)  Albuterol use two time or less a week on average (not counting use with activity)  Cough interfering with sleep two time or less a month  Oral steroids no more than once a year  No hospitalizations  Chronic non-allergic rhinitis Continue Flonase (fluticasone) 1 spray each nostril once a day for stuffy nose Continue Zyrtec (cetirizine) 10 mg once a day during the worst time of the year only May use saline nasal spray or saline nasal rinse as needed. Use this prior to any medicated nasal sprays  Please let us know if this treatment plan is not working well for you. Schedule a follow up appointment in 2 months   Return in about 2 months (around 05/13/2021), or if symptoms worsen or fail to improve.    Thank you for the opportunity to care for this patient.  Please do not hesitate to  contact me with questions.  Nehemiah Settle, FNP [facility]

## 2021-03-14 DIAGNOSIS — Z79899 Other long term (current) drug therapy: Secondary | ICD-10-CM | POA: Diagnosis not present

## 2021-03-14 DIAGNOSIS — Z125 Encounter for screening for malignant neoplasm of prostate: Secondary | ICD-10-CM | POA: Diagnosis not present

## 2021-03-14 DIAGNOSIS — E7849 Other hyperlipidemia: Secondary | ICD-10-CM | POA: Diagnosis not present

## 2021-03-14 NOTE — Telephone Encounter (Signed)
[facility] is requesting refill for Trelegy. Would you like for me to refill it? I didn't see where you wanted him to take it on the last OV.

## 2021-03-14 NOTE — Telephone Encounter (Signed)
Noted  

## 2021-03-14 NOTE — Telephone Encounter (Signed)
Do Not refill. He is now to be using Breztri 2 puffs twice a day  with spacer. Thank you!

## 2021-03-15 LAB — HEPATIC FUNCTION PANEL
ALT: 34 IU/L (ref 0–44)
AST: 27 IU/L (ref 0–40)
Albumin: 4.5 g/dL (ref 3.8–4.9)
Alkaline Phosphatase: 108 IU/L (ref 44–121)
Bilirubin Total: 0.6 mg/dL (ref 0.0–1.2)
Bilirubin, Direct: 0.15 mg/dL (ref 0.00–0.40)
Total Protein: 7.4 g/dL (ref 6.0–8.5)

## 2021-03-15 LAB — BASIC METABOLIC PANEL
BUN/Creatinine Ratio: 9 (ref 9–20)
BUN: 9 mg/dL (ref 6–24)
CO2: 23 mmol/L (ref 20–29)
Calcium: 10 mg/dL (ref 8.7–10.2)
Chloride: 101 mmol/L (ref 96–106)
Creatinine, Ser: 1.04 mg/dL (ref 0.76–1.27)
Glucose: 109 mg/dL — ABNORMAL HIGH (ref 65–99)
Potassium: 5.4 mmol/L — ABNORMAL HIGH (ref 3.5–5.2)
Sodium: 140 mmol/L (ref 134–144)
eGFR: 87 mL/min/{1.73_m2} (ref >59)

## 2021-03-15 LAB — LIPID PANEL
Chol/HDL Ratio: 5 ratio (ref 0.0–5.0)
Cholesterol, Total: 169 mg/dL (ref 100–199)
HDL: 34 mg/dL — ABNORMAL LOW (ref >39)
LDL Chol Calc (NIH): 106 mg/dL — ABNORMAL HIGH (ref 0–99)
Triglycerides: 164 mg/dL — ABNORMAL HIGH (ref 0–149)
VLDL Cholesterol Cal: 29 mg/dL (ref 5–40)

## 2021-03-15 LAB — PSA: Prostate Specific Ag, Serum: 0.4 ng/mL (ref 0.0–4.0)

## 2021-03-19 ENCOUNTER — Other Ambulatory Visit: Payer: Self-pay

## 2021-03-19 ENCOUNTER — Ambulatory Visit (INDEPENDENT_AMBULATORY_CARE_PROVIDER_SITE_OTHER): Payer: Medicare HMO | Admitting: Family Medicine

## 2021-03-19 ENCOUNTER — Encounter: Payer: Self-pay | Admitting: Family Medicine

## 2021-03-19 VITALS — BP 123/69 | HR 79 | Temp 99.1°F | Ht 68.0 in | Wt 226.0 lb

## 2021-03-19 DIAGNOSIS — Z789 Other specified health status: Secondary | ICD-10-CM

## 2021-03-19 MED ORDER — ATORVASTATIN CALCIUM 20 MG PO TABS: 20.0000 mg | ORAL_TABLET | Freq: Every day | ORAL | 0 refills | Status: DC

## 2021-03-19 NOTE — Patient Instructions (Signed)
Atorvastatin Tablets What is this medicine? ATORVASTATIN (a TORE va sta tin) is a statin. It lowers bad cholesterol and triglyceride levels in the blood. It also increases good cholesterol levels. It is used with lifestyle changes, like diet and exercise. It may be used alone or with other drugs. This medicine may be used for other purposes; ask your health care provider or pharmacist if you have questions. COMMON BRAND NAME(S): Lipitor What should I tell my health care provider before I take this medicine? They need to know if you have any of these conditions:  diabetes (high blood sugar)  if you often drink alcohol  kidney disease  liver disease  muscle cramps, pain  stroke  thyroid disease  an unusual or allergic reaction to atorvastatin, other medicines, foods, dyes, or preservatives  pregnant or trying to get pregnant  breast-feeding How should I use this medicine? Take this medicine by mouth. Take it as directed on the prescription label at the same time every day. You can take it with or without food. If it upsets your stomach, take it with food. Keep taking it unless your health care provider tells you to stop. Do not take this medicine with grapefruit juice. Talk to your health care provider about the use of this medicine in children. While it may be prescribed for children as young as 10 for selected conditions, precautions do apply. Overdosage: If you think you have taken too much of this medicine contact a poison control center or emergency room at once. NOTE: This medicine is only for you. Do not share this medicine with others. What if I miss a dose? If you miss a dose, take it as soon as you can. If it is almost time for your next dose, take only that dose. Do not take double or extra doses. What may interact with this medicine? Do not take this medicine with any of the following medications:  dasabuvir; ombitasvir; paritaprevir; ritonavir  ombitasvir;  paritaprevir; ritonavir  posaconazole  red yeast rice This medicine may also interact with the following medications:  alcohol  birth control pills  certain antibiotics like erythromycin and clarithromycin  certain antivirals for HIV or hepatitis  certain medicines for cholesterol like fenofibrate, gemfibrozil, and niacin  certain medicines for fungal infections like ketoconazole and itraconazole  colchicine  cyclosporine  digoxin  grapefruit juice  rifampin This list may not describe all possible interactions. Give your health care provider a list of all the medicines, herbs, non-prescription drugs, or dietary supplements you use. Also tell them if you smoke, drink alcohol, or use illegal drugs. Some items may interact with your medicine. What should I watch for while using this medicine? Visit your health care provider for regular checks on your progress. Tell your health care provider if your symptoms do not start to get better or if they get worse. Your health care provider may tell you to stop taking this medicine if you develop muscle problems. If your muscle problems do not go away after stopping this medicine, contact your health care provider. Do not become pregnant while taking this medicine. Women should inform their health care provider if they wish to become pregnant or think they might be pregnant. There is potential for serious harm to an unborn child. Talk to your health care provider for more information. Do not breast-feed an infant while taking this medicine. Birth control may not work properly while you are taking this medicine. Talk to your health care provider about using   an extra method of birth control. This medicine may increase blood sugar. Ask your health care provider if changes in diet or medicines are needed if you have diabetes. If you are going to need surgery or other procedure, tell your health care provider that you are using this  medicine. Taking this medicine is only part of a total heart healthy program. Your health care provider may give you a special diet to follow. Avoid alcohol. Avoid smoking. Ask your health care provider how much you should exercise. What side effects may I notice from receiving this medicine? Side effects that you should report to your doctor or health care provider as soon as possible:  allergic reactions (skin rash, itching or hives; swelling of the face, lips, or tongue)  high blood sugar (increased hunger, thirst or urination; unusually weak or tired, blurry vision)  infection (fever, chills, cough, sore throat, pain or trouble passing urine)  joint pain  liver injury (dark yellow or brown urine; general ill feeling or flu-like symptoms; loss of appetite, right upper belly pain; unusually weak or tired, yellowing of the eyes or skin)  muscle injury (dark urine; trouble passing urine or change in the amount of urine; unusually weak or tired; muscle pain; back pain)  redness, blistering, peeling, or loosening of the skin, including inside the mouth Side effects that usually do not require medical attention (report to your doctor or health care provider if they continue or are bothersome):  diarrhea  nausea  upset stomach This list may not describe all possible side effects. Call your doctor for medical advice about side effects. You may report side effects to FDA at 1-800-FDA-1088. Where should I keep my medicine? Keep out of the reach of children and pets. Store at room temperature between 20 and 25 degrees C (68 and 77 degrees F). Get rid of any unused medicine after the expiration date. To get rid of medicines that are no longer needed or have expired:  Take the medicine to a medicine take-back program. Check with your pharmacy or law enforcement to find a location.  If you cannot return the medicine, check the label or package insert to see if the medicine should be thrown out  in the garbage or flushed down the toilet. If you are not sure, ask your health care provider. If it is safe to put it in the trash, take the medicine out of the container. Mix the medicine with cat litter, dirt, coffee grounds, or other unwanted substance. Seal the mixture in a bag or container. Put it in the trash. NOTE: This sheet is a summary. It may not cover all possible information. If you have questions about this medicine, talk to your doctor, pharmacist, or health care provider.  2021 Elsevier/Gold Standard (2020-11-08 12:41:57)  

## 2021-03-19 NOTE — Progress Notes (Signed)
Patient ID: Tony Doyle, male    DOB: [birth year], 50 y.o.   MRN: [id number]   Chief Complaint  Patient presents with  . follow up on chronic medical conditions    No concerns at this time    Subjective:  CC: follow-up on labs and HLD  This is not a new problem.  Presents today to follow-up with recent lab work and hyperlipidemia.  Lab work done on April 7, LDL greater than 100, per Tony Doyle's results message, to consider increasing atorvastatin to get the LDL less than 100.  Reports compliant with his atorvastatin as prescribed, denies any chest pain, shortness of breath, muscle weakness,  cramps other than hard work in his [employer].    Medical History Tony Doyle has a past medical history of Asthma, COPD (chronic obstructive pulmonary disease) (HCC), Hypercholesterolemia, and Pneumonia.   Outpatient Encounter Medications as of 03/19/2021  Medication Sig  . albuterol (PROVENTIL) (2.5 MG/3ML) 0.083% nebulizer solution Take 3 mLs (2.5 mg total) by nebulization at bedtime. (Patient taking differently: Take 2.5 mg by nebulization at bedtime. As needed during the day)  . albuterol (VENTOLIN HFA) 108 (90 Base) MCG/ACT inhaler Inhale 2 puffs into the lungs every 6 (six) hours as needed for wheezing or shortness of breath.  Marland Kitchen atorvastatin (LIPITOR) 20 MG tablet Take 1 tablet (20 mg total) by mouth daily.  . Budeson-Glycopyrrol-Formoterol (BREZTRI AEROSPHERE) 160-9-4.8 MCG/ACT AERO Inhale 2 puffs into the lungs in the morning and at bedtime.  . cetirizine (ZYRTEC) 10 MG tablet TAKE 1 TABLET BY MOUTH EVERY DAY  . fluticasone (FLONASE) 50 MCG/ACT nasal spray SPRAY 2 SPRAYS INTO EACH NOSTRIL EVERY DAY (Patient taking differently: Place 2 sprays into both nostrils daily as needed for rhinitis.)  . HYDROcodone-acetaminophen (NORCO/VICODIN) 5-325 MG tablet Take 1 tablet by mouth every 6 (six) hours as needed for moderate pain. (Patient taking differently: Take 1 tablet by mouth every 4  (four) hours as needed for moderate pain.)  . [DISCONTINUED] atorvastatin (LIPITOR) 10 MG tablet TAKE 1 TABLET BY MOUTH EVERY DAY  . BREO ELLIPTA 200-25 MCG/INH AEPB TAKE 1 PUFF BY MOUTH EVERY DAY (Patient not taking: Reported on 03/19/2021)  . [DISCONTINUED] acetaminophen (TYLENOL) 500 MG tablet Take 2,000 mg by mouth every 6 (six) hours as needed for mild pain or moderate pain.   No facility-administered encounter medications on file as of 03/19/2021.     Review of Systems  Constitutional: Negative for chills, fatigue and fever.  Respiratory: Negative for shortness of breath.   Cardiovascular: Negative for chest pain and leg swelling.  Musculoskeletal: Negative for myalgias.       Muscle soreness due to recent work with wife.   Neurological: Negative for headaches.     Vitals BP 123/69   Pulse 79   Temp 99.1 F (37.3 C)   Ht 5\' 8"  (1.727 m)   Wt 226 lb (102.5 kg)   SpO2 97%   BMI 34.36 kg/m   Objective:   Physical Exam Vitals reviewed.  Cardiovascular:     Rate and Rhythm: Normal rate and regular rhythm.     Heart sounds: Normal heart sounds.  Pulmonary:     Effort: Pulmonary effort is normal.     Breath sounds: Normal breath sounds.  Skin:    General: Skin is warm and dry.  Neurological:     General: No focal deficit present.     Mental Status: He is alert.  Psychiatric:  Behavior: Behavior normal.      Assessment and Plan   1. LDL-c greater than or equal to 100 mg/dl - atorvastatin (LIPITOR) 20 MG tablet; Take 1 tablet (20 mg total) by mouth daily.  Dispense: 90 tablet; Refill: 0   Reviewed labs in detail. Per Tony Doyle recommendation, will increase atorvastatin dose to get LDL < 100.   Agrees with plan of care discussed today. Understands warning signs to seek further care: chest pain, shortness of breath, any significant change in health.  Understands to follow-up in three months with Tony Doyle for HLD.    Tony Doyle 03/19/2021

## 2021-03-25 ENCOUNTER — Other Ambulatory Visit: Payer: Self-pay | Admitting: Allergy & Immunology

## 2021-04-04 ENCOUNTER — Encounter: Payer: Self-pay | Admitting: Family Medicine

## 2021-04-04 ENCOUNTER — Ambulatory Visit (INDEPENDENT_AMBULATORY_CARE_PROVIDER_SITE_OTHER): Payer: Medicare HMO | Admitting: Family Medicine

## 2021-04-04 ENCOUNTER — Other Ambulatory Visit: Payer: Self-pay

## 2021-04-04 VITALS — HR 90 | Temp 99.9°F | Resp 16 | Wt 226.4 lb

## 2021-04-04 DIAGNOSIS — J069 Acute upper respiratory infection, unspecified: Secondary | ICD-10-CM | POA: Diagnosis not present

## 2021-04-04 NOTE — Progress Notes (Signed)
Patient ID: Tony Doyle, male    DOB: Feb 20, 1971, 50 y.o.   MRN: [id number]   Chief Complaint  Patient presents with  . congestion and cough    Green mucus for 4 days   Subjective:  CC: congestion and cough   Covid test was negative- thinks he has sinus infection and cold. This is a new problem.  Presents today for an acute visit with a complaint of congestion and cough with green mucus.  Feels like his head is "stopped up ".  Symptoms have started 4 days ago.  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  Has tried over-the-counter sinus pills which have helped some.  Denies fever, chills, chest pain, shortness of breath.  Endorses congestion, postnasal drip, sinus pressure, cough and headaches.    Medical History Tony Doyle has a past medical history of Asthma, COPD (chronic obstructive pulmonary disease) (HCC), Hypercholesterolemia, and Pneumonia.   Outpatient Encounter Medications as of 04/04/2021  Medication Sig  . cetirizine (ZYRTEC) 10 MG tablet TAKE 1 TABLET BY MOUTH EVERY DAY  . albuterol (PROVENTIL) (2.5 MG/3ML) 0.083% nebulizer solution Take 3 mLs (2.5 mg total) by nebulization at bedtime. (Patient taking differently: Take 2.5 mg by nebulization at bedtime. As needed during the day)  . albuterol (VENTOLIN HFA) 108 (90 Base) MCG/ACT inhaler Inhale 2 puffs into the lungs every 6 (six) hours as needed for wheezing or shortness of breath.  Marland Kitchen atorvastatin (LIPITOR) 20 MG tablet Take 1 tablet (20 mg total) by mouth daily.  Marland Kitchen BREO ELLIPTA 200-25 MCG/INH AEPB TAKE 1 PUFF BY MOUTH EVERY DAY (Patient not taking: Reported on 03/19/2021)  . Budeson-Glycopyrrol-Formoterol (BREZTRI AEROSPHERE) 160-9-4.8 MCG/ACT AERO Inhale 2 puffs into the lungs in the morning and at bedtime.  . fluticasone (FLONASE) 50 MCG/ACT nasal spray SPRAY 2 SPRAYS INTO EACH NOSTRIL EVERY DAY (Patient taking differently: Place 2 sprays into both nostrils daily as needed for rhinitis.)  .  HYDROcodone-acetaminophen (NORCO/VICODIN) 5-325 MG tablet Take 1 tablet by mouth every 6 (six) hours as needed for moderate pain. (Patient taking differently: Take 1 tablet by mouth every 4 (four) hours as needed for moderate pain.)   No facility-administered encounter medications on file as of 04/04/2021.     Review of Systems  Constitutional: Negative for chills and fever.  HENT: Positive for congestion, postnasal drip and sinus pressure. Negative for ear pain, sinus pain and sore throat.   Respiratory: Positive for cough. Negative for shortness of breath.   Cardiovascular: Negative for chest pain.  Gastrointestinal: Negative for abdominal pain.  Neurological: Positive for headaches. Negative for dizziness and light-headedness.     Vitals Pulse 90   Temp 99.9 F (37.7 C)   Resp 16   Wt 226 lb 6.4 oz (102.7 kg)   SpO2 97%   BMI 34.42 kg/m   Objective:   Physical Exam Vitals reviewed.  Constitutional:      General: He is not in acute distress.    Appearance: He is not ill-appearing.  HENT:     Right Ear: Tympanic membrane normal.     Left Ear: Tympanic membrane normal.     Nose:     Right Turbinates: Swollen.     Left Turbinates: Swollen.     Right Sinus: No maxillary sinus tenderness or frontal sinus tenderness.     Left Sinus: No maxillary sinus tenderness or frontal sinus tenderness.     Mouth/Throat:     Pharynx: Uvula midline.  Cardiovascular:  Rate and Rhythm: Normal rate and regular rhythm.     Heart sounds: Normal heart sounds.  Pulmonary:     Effort: Pulmonary effort is normal.     Breath sounds: Normal breath sounds.  Abdominal:     General: Bowel sounds are normal.  Skin:    General: Skin is warm and dry.  Neurological:     General: No focal deficit present.     Mental Status: He is alert.  Psychiatric:        Behavior: Behavior normal.      Assessment and Plan   1. Viral upper respiratory tract infection     Physical exam did not reveal  tenderness in maxillary or frontal sinus areas.  Recommend supportive therapy, sinus rinses.  Likely viral upper respiratory infection.  Continue over-the-counter medications for symptom relief.   Agrees with plan of care discussed today. Understands warning signs to seek further care: chest pain, shortness of breath, any significant change in health.  Understands to follow-up by Monday if symptoms have not improved or worsen and will consider antibiotic at that time.   Dorena Bodo, NP 04/04/21

## 2021-04-04 NOTE — Patient Instructions (Addendum)
If your symptoms get worse by Monday call the office and let us know.     Allergic Rhinitis, Adult Allergic rhinitis is a reaction to allergens. Allergens are things that can cause an allergic reaction. This condition affects the lining inside the nose (mucous membrane). There are two types of allergic rhinitis:  Seasonal. This type is also called hay fever. It happens only during some times of the year.  Perennial. This type can happen at any time of the year. This condition cannot be spread from person to person (is not contagious). It can be mild, worse, or very bad. It can develop at any age and may be outgrown. What are the causes? This condition may be caused by:  Pollen from grasses, trees, and weeds.  Dust mites.  Smoke.  Mold.  Car fumes.  The pee (urine), spit, or dander of pets. Dander is dead skin cells from a pet.   What increases the risk? You are more likely to develop this condition if:  You have allergies in your family.  You have problems like allergies in your family. You may have: ? Swelling of parts of your eyes and eyelids. ? Asthma. This affects how you breathe. ? Long-term redness and swelling on your skin. ? Food allergies. What are the signs or symptoms? The main symptom of this condition is a runny or stuffy nose (nasal congestion). Other symptoms may include:  Sneezing or coughing.  Itching and tearing of your eyes.  Mucus that drips down the back of your throat (postnasal drip).  Trouble sleeping.  Feeling tired.  Headache.  Sore throat. How is this treated? There is no cure for this condition. You should avoid things that you are allergic to. Treatment can help to relieve symptoms. This may include:  Medicines that block allergy symptoms, such as corticosteroids or antihistamines. These may be given as a shot, nasal spray, or pill.  Avoiding things you are allergic to.  Medicines that give you bits of what you are allergic to  over time. This is called immunotherapy. It is done if other treatments do not help. You may get: ? Shots. ? Medicine under your tongue.  Stronger medicines, if other treatments do not help. Follow these instructions at home: Avoiding allergens Find out what things you are allergic to and avoid them. To do this, try these things:  If you get allergies any time of year: ? Replace carpet with wood, tile, or vinyl flooring. Carpet can trap pet dander and dust. ? Do not smoke. Do not allow smoking in your home. ? Change your heating and air conditioning filters at least once a month.  If you get allergies only some times of the year: ? Keep windows closed when you can. ? Plan things to do outside when pollen counts are lowest. Check pollen counts before you plan things to do outside. ? When you come indoors, change your clothes and shower before you sit on furniture or bedding.   If you are allergic to a pet: ? Keep the pet out of your bedroom. ? Vacuum, sweep, and dust often.   General instructions  Take over-the-counter and prescription medicines only as told by your doctor.  Drink enough fluid to keep your pee (urine) pale yellow.  Keep all follow-up visits as told by your doctor. This is important. Where to find more information  American Academy of Allergy, Asthma & Immunology: www.aaaai.org Contact a doctor if:  You have a fever.  You get  a cough that does not go away.  You make whistling sounds when you breathe (wheeze).  Your symptoms slow you down.  Your symptoms stop you from doing your normal things each day. Get help right away if:  You are short of breath. This symptom may be an emergency. Do not wait to see if the symptom will go away. Get medical help right away. Call your local emergency services (911 in the [place] Do not drive yourself to the hospital. Summary  Allergic rhinitis may be treated by taking medicines and avoiding things you are allergic  to.  If you have allergies only some of the year, keep windows closed when you can at those times.  Contact your doctor if you get a fever or a cough that does not go away. This information is not intended to replace advice given to you by your health care provider. Make sure you discuss any questions you have with your health care provider. Document Revised: 01/16/2020 Document Reviewed: 11/22/2019 Elsevier Patient Education  2021 Elsevier Inc. How to Perform a Sinus Rinse A sinus rinse is a home treatment. It rinses your sinuses with a mixture of salt and water (saline solution). Sinuses are air-filled spaces in your skull behind the bones of your face and forehead. They open into your nasal cavity. A sinus rinse can help to clear your nasal cavity. It can clear mucus, dirt, dust, or pollen. You may do a sinus rinse when you have:  A cold.  A virus.  Allergies.  A sinus infection.  A stuffy nose. Talk with your doctor about whether a sinus rinse might help you. What are the risks? A sinus rinse is normally very safe and helpful. However, there are a few risks. These include:  A burning feeling in the sinuses. This may happen if you do not make the saline solution as instructed. Be sure to follow all directions when making the saline solution.  Nasal irritation.  Infection from unclean water. This is rare, but possible. Do not do a sinus rinse if you have had:  Ear or nasal surgery.  An ear infection.  Blocked ears. Supplies needed:  Saline solution or powder.  Distilled or germ-free (sterile) water may be needed to mix with saline powder. ? You may use boiled and cooled tap water. Boil tap water for 5 minutes; cool until it is lukewarm. Use within 24 hours. ? Do not use regular tap water to mix with the saline solution.  Neti pot or nasal rinse bottle. This releases the saline solution into your nose and through your sinuses. You can buy neti pots and rinse  bottles: ? At your local pharmacy. ? At a health food store. ? Online. How to perform a sinus rinse 1. Wash your hands with soap and water. 2. Wash your device using the directions that came with it. 3. Dry your device. 4. Use the solution that comes with your device or one that is sold separately in stores. Follow the mixing directions on the package if you need to mix with sterile or distilled water. 5. Fill your device with the amount of saline solution stated in the device instructions. 6. Stand over a sink and tilt your head sideways over the sink. 7. Place the spout of the device in your upper nostril (the one closer to the ceiling). 8. Gently pour or squeeze the saline solution into your nasal cavity. The liquid should drain to your lower nostril if you are not too  stuffed up (congested). 9. While rinsing, breathe through your open mouth. 10. Gently blow your nose to clear any mucus and rinse solution. Blowing too hard may cause ear pain. 11. Repeat in your other nostril. 12. Clean and rinse your device with clean water. 13. Air-dry your device. Talk with your doctor or pharmacist if you have questions about how to do a sinus rinse.   Summary  A sinus rinse is a home treatment. It rinses your sinuses with a mixture of salt and water (saline solution).  A sinus rinse is normally very safe and helpful. Follow all instructions carefully.  Talk with your doctor about whether a sinus rinse might help you. This information is not intended to replace advice given to you by your health care provider. Make sure you discuss any questions you have with your health care provider. Document Revised: 09/04/2020 Document Reviewed: 09/04/2020 Elsevier Patient Education  2021 [employer].

## 2021-04-25 ENCOUNTER — Encounter: Payer: Self-pay | Admitting: Allergy & Immunology

## 2021-04-26 ENCOUNTER — Other Ambulatory Visit: Payer: Self-pay

## 2021-04-26 ENCOUNTER — Encounter (HOSPITAL_COMMUNITY): Payer: Self-pay | Admitting: *Deleted

## 2021-04-26 ENCOUNTER — Emergency Department (HOSPITAL_COMMUNITY)
Admission: EM | Admit: 2021-04-26 | Discharge: 2021-04-26 | Disposition: A | Discharge status: Home / Self Care | Payer: Medicare HMO | Attending: Emergency Medicine | Admitting: Emergency Medicine

## 2021-04-26 DIAGNOSIS — N492 Inflammatory disorders of scrotum: Secondary | ICD-10-CM | POA: Diagnosis present

## 2021-04-26 DIAGNOSIS — L02214 Cutaneous abscess of groin: Secondary | ICD-10-CM | POA: Diagnosis not present

## 2021-04-26 DIAGNOSIS — J45909 Unspecified asthma, uncomplicated: Secondary | ICD-10-CM | POA: Insufficient documentation

## 2021-04-26 DIAGNOSIS — F17210 Nicotine dependence, cigarettes, uncomplicated: Secondary | ICD-10-CM | POA: Insufficient documentation

## 2021-04-26 DIAGNOSIS — J449 Chronic obstructive pulmonary disease, unspecified: Secondary | ICD-10-CM | POA: Diagnosis not present

## 2021-04-26 DIAGNOSIS — L0291 Cutaneous abscess, unspecified: Secondary | ICD-10-CM

## 2021-04-26 DIAGNOSIS — Z7951 Long term (current) use of inhaled steroids: Secondary | ICD-10-CM | POA: Insufficient documentation

## 2021-04-26 DIAGNOSIS — R69 Illness, unspecified: Secondary | ICD-10-CM | POA: Diagnosis not present

## 2021-04-26 MED ORDER — LIDOCAINE HCL (PF) 1 % IJ SOLN
10.0000 mL | Freq: Once | INTRAMUSCULAR | Status: AC
  Administered 2021-04-26: 10 mL
  Filled 2021-04-26: qty 30

## 2021-04-26 MED ORDER — DOXYCYCLINE HYCLATE 100 MG PO TABS
100.0000 mg | ORAL_TABLET | Freq: Once | ORAL | Status: AC
  Administered 2021-04-26: 100 mg via ORAL
  Filled 2021-04-26: qty 1

## 2021-04-26 MED ORDER — POVIDONE-IODINE 10 % EX SOLN
CUTANEOUS | Status: AC
  Filled 2021-04-26: qty 15

## 2021-04-26 MED ORDER — DOXYCYCLINE HYCLATE 100 MG PO CAPS: 100.0000 mg | ORAL_CAPSULE | Freq: Two times a day (BID) | ORAL | 0 refills | Status: AC

## 2021-04-26 NOTE — ED Notes (Signed)
Hard, red knot noted to left side groin area x 2 says. No draining noted. Had one in same place last year.

## 2021-04-26 NOTE — Discharge Instructions (Addendum)
Please take the antibiotics as prescribed use warm compresses to this area for the next several days several times per day. FOLLOW UP : With your primary care doctor in the next several days.  Preferably Monday or Tuesday.  If you are unable to follow-up within that period of time come to the ER for recheck

## 2021-04-26 NOTE — ED Provider Notes (Signed)
[facility] EMERGENCY DEPARTMENT Provider Note   CSN: [id number] Arrival date & time: 04/26/21  1046     History Chief Complaint  Patient presents with  . Abscess    Tony Doyle is a 50 y.o. male.  HPI Patient is a 50 year old male with past medical history significant for COPD and hypercholesterolemia presenting today with abscess to the left part of his groin for the past 3 to 4 days.  He states that about 1 year ago he had a similar boil that was lanced here in the ER.  He is not had any episodes of this since.  He states it completely resolved last year after treatment.  He denies any fevers chills nausea vomiting chest pain shortness of breath lightheadedness or dizziness.  No other associate symptoms.  He states that the area is achy and more tender with sitting.  Denies any difficulty peeing or pooping.  Denies any other associate symptoms.  Has taken no medications prior to arrival.  He states he is not a diabetic.    Past Medical History:  Diagnosis Date  . Asthma   . COPD (chronic obstructive pulmonary disease) (HCC)   . Hypercholesterolemia   . Pneumonia     Patient Active Problem List   Diagnosis Date Noted  . LDL-c greater than or equal to 100 mg/dl 59/74/1638  . Viral upper respiratory tract infection 01/14/2021  . Asthma with COPD (HCC) 09/12/2020  . Non-allergic rhinitis 09/12/2020  . Hyperlipidemia 12/13/2018  . Chest pain, musculoskeletal 02/22/2014  . Smoker 02/22/2014  . Foot contusion 08/23/2012    Past Surgical History:  Procedure Laterality Date  . arm surgery     LEFT  . COLONOSCOPY WITH PROPOFOL N/A 10/12/2020   Procedure: COLONOSCOPY WITH PROPOFOL;  Surgeon: Lanelle Bal, DO;  Location: [facility];  Service: Endoscopy;  Laterality: N/A;  9:30  . LEG SURGERY     LEFT       Family History  Problem Relation Age of Onset  . Asthma Father   . Emphysema Father        smoked  . Asthma Mother   . Emphysema Mother        smoked   . Eczema Brother   . Allergic rhinitis Neg Hx   . Angioedema Neg Hx   . Atopy Neg Hx   . Immunodeficiency Neg Hx   . Urticaria Neg Hx     Social History   Tobacco Use  . Smoking status: Current Every Day Smoker    Packs/day: 1.00    Years: 33.00    Pack years: 33.00    Types: Cigarettes    Start date: 55  . Smokeless tobacco: Never Used  Vaping Use  . Vaping Use: Never used  Substance Use Topics  . Alcohol use: No  . Drug use: No    Home Medications Prior to Admission medications   Medication Sig Start Date End Date Taking? Authorizing Provider  cetirizine (ZYRTEC) 10 MG tablet TAKE 1 TABLET BY MOUTH EVERY DAY 03/25/21   Nehemiah Settle, FNP  doxycycline (VIBRAMYCIN) 100 MG capsule Take 1 capsule (100 mg total) by mouth 2 (two) times daily for 7 days. 04/26/21 05/03/21 Yes Malekai Markwood S, PA  albuterol (PROVENTIL) (2.5 MG/3ML) 0.083% nebulizer solution Take 3 mLs (2.5 mg total) by nebulization at bedtime. Patient taking differently: Take 2.5 mg by nebulization at bedtime. As needed during the day 09/12/20   Alfonse Spruce, MD  albuterol (VENTOLIN HFA)  108 (90 Base) MCG/ACT inhaler Inhale 2 puffs into the lungs every 6 (six) hours as needed for wheezing or shortness of breath. 04/27/20   Alfonse Spruce, MD  atorvastatin (LIPITOR) 20 MG tablet Take 1 tablet (20 mg total) by mouth daily. 03/19/21   Novella Olive, NP  BREO ELLIPTA 200-25 MCG/INH AEPB TAKE 1 PUFF BY MOUTH EVERY DAY Patient not taking: Reported on 03/19/2021 10/17/20   Alfonse Spruce, MD  Budeson-Glycopyrrol-Formoterol (BREZTRI AEROSPHERE) 160-9-4.8 MCG/ACT AERO Inhale 2 puffs into the lungs in the morning and at bedtime. 03/13/21   Nehemiah Settle, FNP  fluticasone (FLONASE) 50 MCG/ACT nasal spray SPRAY 2 SPRAYS INTO EACH NOSTRIL EVERY DAY Patient taking differently: Place 2 sprays into both nostrils daily as needed for rhinitis. 05/01/20   Babs Sciara, MD  HYDROcodone-acetaminophen  (NORCO/VICODIN) 5-325 MG tablet Take 1 tablet by mouth every 6 (six) hours as needed for moderate pain. Patient taking differently: Take 1 tablet by mouth every 4 (four) hours as needed for moderate pain. 09/16/20   Burgess Amor, PA-C    Allergies    Wellbutrin [bupropion], Chantix [varenicline tartrate], and Penicillins  Review of Systems   Review of Systems  Constitutional: Negative for chills and fever.  HENT: Negative for congestion.   Eyes: Negative for pain.  Respiratory: Negative for cough and shortness of breath.   Cardiovascular: Negative for chest pain and leg swelling.  Gastrointestinal: Negative for abdominal pain and vomiting.  Genitourinary: Negative for dysuria.  Musculoskeletal: Negative for myalgias.  Skin: Negative for rash.       Groin boil  Neurological: Negative for dizziness and headaches.    Physical Exam Updated Vital Signs BP 127/64 (BP Location: Left Arm)   Pulse 66   Temp 98.1 F (36.7 C) (Oral)   Resp 19   Ht 5\' 8"  (1.727 m)   Wt 96.2 kg   SpO2 98%   BMI 32.23 kg/m   Physical Exam Vitals and nursing note reviewed.  Constitutional:      General: He is not in acute distress.    Appearance: Normal appearance. He is not ill-appearing.  HENT:     Head: Normocephalic and atraumatic.  Eyes:     General: No scleral icterus.       Right eye: No discharge.        Left eye: No discharge.     Conjunctiva/sclera: Conjunctivae normal.  Pulmonary:     Effort: Pulmonary effort is normal.     Breath sounds: No stridor.  Genitourinary:   Neurological:     Mental Status: He is alert and oriented to person, place, and time. Mental status is at baseline.     ED Results / Procedures / Treatments   Labs (all labs ordered are listed, but only abnormal results are displayed) Labs Reviewed - No data to display  EKG None  Radiology No results found.  Procedures . Incision and Drainage  Date/Time: 04/26/2021 2:41 PM Performed by: 04/28/2021, PA Authorized by: Gailen Shelter, PA   Consent:    Consent obtained:  Verbal   Consent given by:  Patient   Risks discussed:  Bleeding, incomplete drainage, pain and damage to other organs   Alternatives discussed:  No treatment Universal protocol:    Procedure explained and questions answered to patient or proxy's satisfaction: yes     Relevant documents present and verified: yes     Test results available : yes     Imaging studies available:  yes     Required blood products, implants, devices, and special equipment available: yes     Site/side marked: yes     Immediately prior to procedure, a time out was called: yes     Patient identity confirmed:  Verbally with patient Location:    Type:  Abscess   Size:  1x1 cm   Location:  Anogenital   Anogenital location:  Scrotal space Pre-procedure details:    Skin preparation:  Betadine Anesthesia:    Anesthesia method:  Local infiltration   Local anesthetic:  Lidocaine 1% w/o epi Procedure type:    Complexity:  Simple Procedure details:    Incision types:  Single straight   Incision depth:  Subcutaneous   Wound management:  Probed and deloculated, irrigated with saline and extensive cleaning   Drainage:  Purulent   Drainage amount:  Moderate   Wound treatment:  Wound left open Post-procedure details:    Procedure completion:  Tolerated well, no immediate complications Comments:     Patient tolerated procedure well.  Irrigation was conducted with normal saline approximately 100 mL.  Probed and deloculated.  Abscesses too small to require packing.  Patient tolerated procedure well.     Medications Ordered in ED Medications  lidocaine (PF) (XYLOCAINE) 1 % injection 10 mL (10 mLs Infiltration Given by Other 04/26/21 1330)  povidone-iodine (BETADINE) 10 % external solution (  Given 04/26/21 1417)  doxycycline (VIBRA-TABS) tablet 100 mg (100 mg Oral Given 04/26/21 1419)    ED Course  I have reviewed the triage vital signs and  the nursing notes.  Pertinent labs & imaging results that were available during my care of the patient were reviewed by me and considered in my medical decision making (see chart for details).    MDM Rules/Calculators/A&P                          Patient with left inguinal abscess.  It is superficial appearing but does have some indurated tissue surrounding it.  Will incise and drain and discharge with doxycycline.  No evidence of Fournier's gangrene.  Patient has the ability to closely follow-up with PCP.  Will either follow-up Monday or Tuesday or return to ER for recheck.  Incision was successful.  Drainage removed.  Irrigation completed.  Final Clinical Impression(s) / ED Diagnoses Final diagnoses:  Abscess  Abscess of left groin    Rx / DC Orders ED Discharge Orders         Ordered    doxycycline (VIBRAMYCIN) 100 MG capsule  2 times daily        04/26/21 1406           Solon Augusta Thrall, [place] 04/26/21 1443    Vanetta Mulders, MD 04/26/21 1819

## 2021-04-26 NOTE — ED Triage Notes (Signed)
Abscess in groin area for 3 days

## 2021-05-01 ENCOUNTER — Ambulatory Visit (INDEPENDENT_AMBULATORY_CARE_PROVIDER_SITE_OTHER): Payer: Medicare HMO | Admitting: Family Medicine

## 2021-05-01 ENCOUNTER — Other Ambulatory Visit: Payer: Self-pay

## 2021-05-01 ENCOUNTER — Encounter: Payer: Self-pay | Admitting: Family Medicine

## 2021-05-01 VITALS — BP 128/80 | HR 91 | Temp 97.9°F | Wt 229.0 lb

## 2021-05-01 DIAGNOSIS — L02215 Cutaneous abscess of perineum: Secondary | ICD-10-CM | POA: Diagnosis not present

## 2021-05-01 NOTE — Progress Notes (Signed)
   Subjective:    Patient ID: Tony Doyle, male    DOB: Feb 19, 1971, 50 y.o.   MRN: [id number]  HPI Pt here for ER follow up. Pt went [facility] on Friday. Pt has abscess in private area. ER did cut and drain. Pt states the area is decreasing in size but has whelps now and not sure why. Pt was prescribed doxycycline and pt is still taking this.  Patient denies any fever currently denies any severe pain ER note was reviewed Review of Systems     Objective:   Physical Exam Abdomen no sign of any abscess perineal area/genital area looks good Small abscess along the left side that is has minimal drainage but no redness       Assessment & Plan:  Perineal abscess resolving.  Warm compresses finish out antibiotics, warning signs discussed, follow-up if any ongoing troubles Follow-up by September for regular health checkups

## 2021-05-10 ENCOUNTER — Other Ambulatory Visit: Payer: Self-pay | Admitting: Family Medicine

## 2021-06-18 ENCOUNTER — Ambulatory Visit: Payer: Medicare HMO | Admitting: Family Medicine

## 2021-07-29 ENCOUNTER — Other Ambulatory Visit: Payer: Self-pay | Admitting: Family Medicine

## 2021-07-29 DIAGNOSIS — Z789 Other specified health status: Secondary | ICD-10-CM

## 2021-08-16 ENCOUNTER — Telehealth: Payer: Self-pay | Admitting: Family Medicine

## 2021-08-16 NOTE — Telephone Encounter (Signed)
Left message for patient to call back and schedule Medicare Annual Wellness Visit (AWV) in office.   Offer Saturday Sept 10,2022 to do by phone or video 8-12  If unable, please get scheduled in the office for AWV or virtually/telephone.  No hx of AWV eligible for AWVI as of 12/08/2009  Please schedule at anytime with [facility]Nurse Health Advisor.      40 Minutes appointment   Any questions, please call me at [phone]   

## 2021-08-19 ENCOUNTER — Encounter: Payer: Self-pay | Admitting: Family Medicine

## 2021-08-19 ENCOUNTER — Ambulatory Visit: Payer: Medicare HMO | Admitting: Family Medicine

## 2021-08-19 ENCOUNTER — Ambulatory Visit (INDEPENDENT_AMBULATORY_CARE_PROVIDER_SITE_OTHER): Payer: Medicare HMO | Admitting: Family Medicine

## 2021-08-19 ENCOUNTER — Other Ambulatory Visit: Payer: Self-pay

## 2021-08-19 VITALS — BP 128/70 | HR 69 | Temp 98.8°F | Ht 68.0 in | Wt 229.0 lb

## 2021-08-19 DIAGNOSIS — K118 Other diseases of salivary glands: Secondary | ICD-10-CM

## 2021-08-19 DIAGNOSIS — Z789 Other specified health status: Secondary | ICD-10-CM | POA: Diagnosis not present

## 2021-08-19 DIAGNOSIS — E7849 Other hyperlipidemia: Secondary | ICD-10-CM | POA: Diagnosis not present

## 2021-08-19 DIAGNOSIS — L570 Actinic keratosis: Secondary | ICD-10-CM

## 2021-08-19 MED ORDER — ATORVASTATIN CALCIUM 20 MG PO TABS: 20.0000 mg | ORAL_TABLET | Freq: Every day | ORAL | 1 refills | Status: DC

## 2021-08-19 NOTE — Progress Notes (Signed)
   Subjective:    Patient ID: Tony Doyle, male    DOB: [birth year], 50 y.o.   MRN: [id number]  HPI Left jaw and neck swelling this morning after ate breakfast - swelling has subsided some - last happened 5 to 6 months ago   Patient relates that the area around his left parotid is swollen up a couple different times over the past several months has been a little bit sore when it occurred but then it gradually goes down over less than a day and gets better.  He denies any swelling in his neck denies any difficulty swallowing.  Denies any masses or growths  Review of Systems     Objective:   Physical Exam General-in no acute distress Eyes-no discharge Lungs-respiratory rate normal, CTA CV-no murmurs,RRR Extremities skin warm dry no edema Neuro grossly normal Behavior normal, alert  Parotid not swollen currently No masses within the mouth.  No masses in the neck.  Patient encouraged to quit smoking.      Assessment & Plan:  Left parotid gland swelling No masses or tumors were felt If reoccurs on a regular basis needs to go to ENT notify us we will help set up referral Patient was encouraged to quit smoking Wellness exam in the near future Labs ordered

## 2021-09-18 ENCOUNTER — Ambulatory Visit (INDEPENDENT_AMBULATORY_CARE_PROVIDER_SITE_OTHER): Payer: Medicare HMO | Admitting: Allergy & Immunology

## 2021-09-18 ENCOUNTER — Other Ambulatory Visit: Payer: Self-pay

## 2021-09-18 ENCOUNTER — Encounter: Payer: Self-pay | Admitting: Allergy & Immunology

## 2021-09-18 VITALS — BP 120/70 | HR 60 | Temp 98.8°F | Resp 24

## 2021-09-18 DIAGNOSIS — F17200 Nicotine dependence, unspecified, uncomplicated: Secondary | ICD-10-CM | POA: Diagnosis not present

## 2021-09-18 DIAGNOSIS — J310 Chronic rhinitis: Secondary | ICD-10-CM

## 2021-09-18 DIAGNOSIS — J449 Chronic obstructive pulmonary disease, unspecified: Secondary | ICD-10-CM | POA: Diagnosis not present

## 2021-09-18 DIAGNOSIS — R69 Illness, unspecified: Secondary | ICD-10-CM | POA: Diagnosis not present

## 2021-09-18 MED ORDER — FLUTICASONE-SALMETEROL 250-50 MCG/ACT IN AEPB: 1.0000 | INHALATION_SPRAY | Freq: Two times a day (BID) | RESPIRATORY_TRACT | 1 refills | Status: DC

## 2021-09-18 NOTE — Progress Notes (Signed)
FOLLOW UP  Date of Service/Encounter:  09/18/21   Assessment:   Asthma with COPD overlap   Chronic rhinitis    Current smoker  Family history of AAT deficiency - checking level and phenotype today  Plan/Recommendations:   1. Moderate persistent asthma, uncomplicated - Lung testing looked stable.  - We are going to change to Advair to see if this is covered better. - We are going to get some labs to check for the alpha-1 antitrypsin level.   - Daily controller medication(s): Advair 250/57mcg one puff twice daily + albuterol nebulizer once daily  at night if needed - Prior to physical activity: albuterol 2 puffs 10-15 minutes before physical activity. - Rescue medications: albuterol 4 puffs every 4-6 hours as needed - Asthma control goals:  * Full participation in all desired activities (may need albuterol before activity) * Albuterol use two time or less a week on average (not counting use with activity) * Cough interfering with sleep two time or less a month * Oral steroids no more than once a year * No hospitalizations  2. Chronic non-allergic rhinitis - Continue with: Flonase (fluticasone) one spray per nostril daily (but use EVERY DAY for the best effect) and Zyrtec (cetirizine) 10mg  tablet once daily (DURING WORST TIMES OF THE YEAR ONLY) - Consider nasal saline rinses 1-2 times daily to remove allergens from the nasal cavities as well as help with mucous clearance (this is especially helpful to do before the nasal sprays are given)  3. Return in about 6 months (around 03/19/2022).   Subjective:   Tony Doyle is a 50 y.o. male presenting today for follow up of  Chief Complaint  Patient presents with   Asthma    Tony Doyle has a history of the following: Patient Active Problem List   Diagnosis Date Noted   LDL-c greater than or equal to 100 mg/dl [name]   Viral upper respiratory tract infection 01/14/2021   Asthma with COPD (HCC) 09/12/2020    Non-allergic rhinitis 09/12/2020   Hyperlipidemia 12/13/2018   Chest pain, musculoskeletal 02/22/2014   Smoker 02/22/2014   Foot contusion 08/23/2012    History obtained from: chart review and patient and wife .  Tony Doyle is a 50 y.o. male presenting for a follow up visit. He was last seen in April 2022 by May 2022, FNP. At that time, we stopped the [facility] because it was making him feel sick. We started him on Breztri two puffs BID as well as continuing with albuterol as needed. For his rhinitis, we continued with Flonase one spray per nostril daly and cetirizine 10mg  daily. He also continued with the nasal saline rinses as needed. Smoking cessation provided.   Since the last visit, he has done well for the most part. 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 He stays busy even in the winter months.   Asthma/Respiratory Symptom History: He  is not using the Breztri two puffs twice daily. This is $50 per month, unfortunately. This has led him to decrease the frequency with which he uses his controller. He does get at least two puffs in daily and he feels that this helps. He does use his albuterol nebulizer once daily. He typically does this at night to help with coughing at night. It helps him to sleep better and opens him up.   His mother was recently diagnosed with alpha 1 antitrypsin deficiency. She is also a smoker and is working  on quitting. She is in her 61s, but neither Anuar nor his wife seem to know exactly how old she is. Regardless, they are wondering about the genetics of this disease. He has never been tested for this in the past.   Allergic Rhinitis Symptom History: He remains on the cetirizine as well as the fluticasone. He has not needed antibiotics in quite some time for sinusitis, although he did have a groin abscess in May 2022.   Otherwise, there have been no changes to his past medical history, surgical history, family history, or social  history.    Review of Systems  Constitutional: Negative.  Negative for chills, fever, malaise/fatigue and weight loss.  HENT:  Positive for congestion. Negative for ear discharge, ear pain and sinus pain.   Eyes:  Negative for pain, discharge and redness.  Respiratory:  Positive for cough. Negative for sputum production, shortness of breath and wheezing.   Cardiovascular: Negative.  Negative for chest pain and palpitations.  Gastrointestinal:  Negative for abdominal pain, constipation, diarrhea, heartburn, nausea and vomiting.  Skin: Negative.  Negative for itching and rash.  Neurological:  Negative for dizziness and headaches.  Endo/Heme/Allergies:  Positive for environmental allergies. Does not bruise/bleed easily.      Objective:   Blood pressure 120/70, pulse 60, temperature 98.8 F (37.1 C), temperature source Temporal, resp. rate (!) 24, SpO2 97 %. There is no height or weight on file to calculate BMI.   Physical Exam:  Physical Exam Vitals reviewed.  Constitutional:      Appearance: He is well-developed.     Comments: Laughing and boisterous.   HENT:     Head: Normocephalic and atraumatic.     Right Ear: Tympanic membrane, ear canal and external ear normal.     Left Ear: Tympanic membrane, ear canal and external ear normal.     Nose: No nasal deformity, septal deviation, mucosal edema or rhinorrhea.     Right Turbinates: Enlarged and swollen.     Left Turbinates: Enlarged and swollen.     Right Sinus: No maxillary sinus tenderness or frontal sinus tenderness.     Left Sinus: No maxillary sinus tenderness or frontal sinus tenderness.     Comments: Dried crusty mucous bilaterally.     Mouth/Throat:     Mouth: Mucous membranes are not pale and not dry.     Pharynx: Uvula midline.  Eyes:     General: Lids are normal. Allergic shiner present.        Right eye: No discharge.        Left eye: No discharge.     Conjunctiva/sclera: Conjunctivae normal.     Right eye:  Right conjunctiva is not injected. No chemosis.    Left eye: Left conjunctiva is not injected. No chemosis.    Pupils: Pupils are equal, round, and reactive to light.  Cardiovascular:     Rate and Rhythm: Normal rate and regular rhythm.     Heart sounds: Normal heart sounds.  Pulmonary:     Effort: Pulmonary effort is normal. No tachypnea, accessory muscle usage or respiratory distress.     Breath sounds: Examination of the right-lower field reveals decreased breath sounds. Examination of the left-lower field reveals decreased breath sounds. Decreased breath sounds present. No wheezing, rhonchi or rales.  Chest:     Chest wall: No tenderness.  Lymphadenopathy:     Cervical: No cervical adenopathy.  Skin:    Coloration: Skin is not pale.     Findings: No  abrasion, erythema, petechiae or rash. Rash is not papular, urticarial or vesicular.  Neurological:     Mental Status: He is alert.  Psychiatric:        Behavior: Behavior is cooperative.     Diagnostic studies:    Spirometry: results abnormal (FEV1: 2.12/58%, FVC: 3.11/68%, FEV1/FVC: 68%).    Spirometry consistent with possible restrictive disease. This is very stable compared to previous spirometric findings.   Allergy Studies: none        Malachi Bonds, MD  [facility]         /

## 2021-09-18 NOTE — Addendum Note (Signed)
Addended by: Alfonse Spruce on: 09/18/2021 02:40 PM   Modules accepted: Orders

## 2021-09-18 NOTE — Patient Instructions (Addendum)
1. Moderate persistent asthma, uncomplicated - Lung testing looked stable.  - We are going to change to Advair to see if this is covered better. - We are going to get some labs to check for the alpha-1 antitrypsin level.   - Daily controller medication(s): Advair 250/73mcg one puff twice daily + albuterol nebulizer once daily  at night if needed - Prior to physical activity: albuterol 2 puffs 10-15 minutes before physical activity. - Rescue medications: albuterol 4 puffs every 4-6 hours as needed - Asthma control goals:  * Full participation in all desired activities (may need albuterol before activity) * Albuterol use two time or less a week on average (not counting use with activity) * Cough interfering with sleep two time or less a month * Oral steroids no more than once a year * No hospitalizations  2. Chronic non-allergic rhinitis - Continue with: Flonase (fluticasone) one spray per nostril daily (but use EVERY DAY for the best effect) and Zyrtec (cetirizine) 10mg  tablet once daily (DURING WORST TIMES OF THE YEAR ONLY) - Consider nasal saline rinses 1-2 times daily to remove allergens from the nasal cavities as well as help with mucous clearance (this is especially helpful to do before the nasal sprays are given)  3. Return in about 6 months (around 03/19/2022).    Please inform 05/19/2022 of any Emergency Department visits, hospitalizations, or changes in symptoms. Call us before going to the ED for breathing or allergy symptoms since we might be able to fit you in for a sick visit. Feel free to contact us anytime with any questions, problems, or concerns.  It was a pleasure to see you again today!  Websites that have reliable patient information: 1. American Academy of Asthma, Allergy, and Immunology: www.aaaai.org 2. Food Allergy Research and Education (FARE): foodallergy.org 3. Mothers of Asthmatics: http://www.asthmacommunitynetwork.org 4. American College of Allergy, Asthma, and  Immunology: www.acaai.org   COVID-19 Vaccine Information can be found at: [place] For questions related to vaccine distribution or appointments, please email [email or web address] or call [phone].     "Like" [phone] on Facebook and Instagram for our latest updates!       Make sure you are registered to vote! If you have moved or changed any of your contact information, you will need to get this updated before voting!  In some cases, you MAY be able to register to vote online: [place]

## 2021-09-18 NOTE — Addendum Note (Signed)
Addended by: Areta Haber B on: 09/18/2021 02:39 PM   Modules accepted: Orders

## 2021-09-19 ENCOUNTER — Encounter: Payer: Self-pay | Admitting: Allergy & Immunology

## 2021-09-19 ENCOUNTER — Other Ambulatory Visit: Payer: Self-pay | Admitting: *Deleted

## 2021-09-25 LAB — CBC WITH DIFFERENTIAL/PLATELET
Basophils Absolute: 0.1 10*3/uL (ref 0.0–0.2)
Basos: 1 %
EOS (ABSOLUTE): 0.1 10*3/uL (ref 0.0–0.4)
Eos: 2 %
Hematocrit: 45.1 % (ref 37.5–51.0)
Hemoglobin: 15.2 g/dL (ref 13.0–17.7)
Immature Grans (Abs): 0 10*3/uL (ref 0.0–0.1)
Immature Granulocytes: 0 %
Lymphocytes Absolute: 2.4 10*3/uL (ref 0.7–3.1)
Lymphs: 43 %
MCH: 28.8 pg (ref 26.6–33.0)
MCHC: 33.7 g/dL (ref 31.5–35.7)
MCV: 85 fL (ref 79–97)
Monocytes Absolute: 0.3 10*3/uL (ref 0.1–0.9)
Monocytes: 5 %
Neutrophils Absolute: 2.8 10*3/uL (ref 1.4–7.0)
Neutrophils: 49 %
Platelets: 178 10*3/uL (ref 150–450)
RBC: 5.28 x10E6/uL (ref 4.14–5.80)
RDW: 12.8 % (ref 11.6–15.4)
WBC: 5.6 10*3/uL (ref 3.4–10.8)

## 2021-09-25 LAB — ASPERGILLUS PRECIPITINS
A.Fumigatus #1 Abs: NEGATIVE
Aspergillus Flavus Antibodies: NEGATIVE
Aspergillus Niger Antibodies: NEGATIVE
Aspergillus glaucus IgG: NEGATIVE
Aspergillus nidulans IgG: NEGATIVE
Aspergillus terreus IgG: NEGATIVE

## 2021-09-25 LAB — IGE: IgE (Immunoglobulin E), Serum: 4 IU/mL — ABNORMAL LOW (ref 6–495)

## 2021-09-25 LAB — ALPHA-1-ANTITRYPSIN: A-1 Antitrypsin: 138 mg/dL (ref 101–187)

## 2021-09-25 LAB — ANCA TITERS
Atypical pANCA: <1:20 {titer}
C-ANCA: <1:20 {titer}
P-ANCA: <1:20 {titer}

## 2021-09-27 ENCOUNTER — Ambulatory Visit
Admission: EM | Admit: 2021-09-27 | Discharge: 2021-09-27 | Disposition: A | Discharge status: Home / Self Care | Payer: Medicare HMO | Attending: Family Medicine | Admitting: Family Medicine

## 2021-09-27 ENCOUNTER — Encounter: Payer: Self-pay | Admitting: Emergency Medicine

## 2021-09-27 ENCOUNTER — Other Ambulatory Visit: Payer: Self-pay

## 2021-09-27 DIAGNOSIS — S29011A Strain of muscle and tendon of front wall of thorax, initial encounter: Secondary | ICD-10-CM | POA: Diagnosis not present

## 2021-09-27 MED ORDER — PREDNISONE 20 MG PO TABS: 40.0000 mg | ORAL_TABLET | Freq: Every day | ORAL | 0 refills | Status: DC

## 2021-09-27 MED ORDER — CYCLOBENZAPRINE HCL 10 MG PO TABS: 10.0000 mg | ORAL_TABLET | Freq: Three times a day (TID) | ORAL | 0 refills | Status: DC | PRN

## 2021-09-27 NOTE — ED Triage Notes (Signed)
Right sided Chest pain started last night.  Hurts with movement and coughing.  Was using clipper yesterday for yard work and thinks this may have caused the pain.

## 2021-09-27 NOTE — ED Provider Notes (Signed)
[facility]    CSN: [id number] Arrival date & time: 09/27/21  3299      History   Chief Complaint No chief complaint on file.   HPI Tony Doyle is a 50 y.o. male.   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  States the pain started very shortly after that.  Denies swelling, discoloration, shortness of breath, dizziness, history of heart issues.  Does have a history of asthma and COPD but has not had any wheezing chest tightness or shortness of breath since onset.  Past Medical History:  Diagnosis Date   Asthma    COPD (chronic obstructive pulmonary disease) (HCC)    Hypercholesterolemia    Pneumonia    Patient Active Problem List   Diagnosis Date Noted   LDL-c greater than or equal to 100 mg/dl 24/26/8341   Viral upper respiratory tract infection 01/14/2021   Asthma with COPD (HCC) 09/12/2020   Non-allergic rhinitis 09/12/2020   Hyperlipidemia 12/13/2018   Chest pain, musculoskeletal 02/22/2014   Smoker 02/22/2014   Foot contusion 08/23/2012   Past Surgical History:  Procedure Laterality Date   arm surgery     LEFT   COLONOSCOPY WITH PROPOFOL N/A 10/12/2020   Procedure: COLONOSCOPY WITH PROPOFOL;  Surgeon: Lanelle Bal, DO;  [facility];  Service: Endoscopy;  Laterality: N/A;  9:30   LEG SURGERY     LEFT     Home Medications    Prior to Admission medications   Medication Sig Start Date End Date Taking? Authorizing Provider  cyclobenzaprine (FLEXERIL) 10 MG tablet Take 1 tablet (10 mg total) by mouth 3 (three) times daily as needed for muscle spasms. Do not drink alcohol or drive while taking this medication. May cause drowsiness 09/27/21  Yes Particia Nearing, PA-C  predniSONE (DELTASONE) 20 MG tablet Take 2 tablets (40 mg total) by mouth  daily with breakfast. 09/27/21  Yes Particia Nearing, PA-C  albuterol (PROVENTIL) (2.5 MG/3ML) 0.083% nebulizer solution Take 3 mLs (2.5 mg total) by nebulization at bedtime. Patient taking differently: Take 2.5 mg by nebulization at bedtime. As needed during the day 09/12/20   Alfonse Spruce, MD  albuterol (VENTOLIN HFA) 108 (90 Base) MCG/ACT inhaler Inhale 2 puffs into the lungs every 6 (six) hours as needed for wheezing or shortness of breath. 04/27/20   Alfonse Spruce, MD  atorvastatin (LIPITOR) 20 MG tablet Take 1 tablet (20 mg total) by mouth daily. 08/19/21   Babs Sciara, MD  BREO ELLIPTA 200-25 MCG/INH AEPB TAKE 1 PUFF BY MOUTH EVERY DAY Patient not taking: Reported on 09/18/2021 10/17/20   Alfonse Spruce, MD  Budeson-Glycopyrrol-Formoterol (BREZTRI AEROSPHERE) 160-9-4.8 MCG/ACT AERO Inhale 2 puffs into the lungs in the morning and at bedtime. Patient taking differently: Inhale 2 puffs into the lungs as needed. 03/13/21   Nehemiah Settle, FNP  cetirizine (ZYRTEC) 10 MG tablet TAKE 1 TABLET BY MOUTH EVERY DAY 03/25/21   Nehemiah Settle, FNP  fluticasone (FLONASE) 50 MCG/ACT nasal spray SPRAY 2 SPRAYS INTO EACH NOSTRIL EVERY DAY Patient taking differently: Place 2 sprays into both nostrils daily as needed for rhinitis. 05/01/20   Babs Sciara, MD  fluticasone-salmeterol (ADVAIR) 250-50 MCG/ACT AEPB Inhale 1 puff into the lungs in the morning and at bedtime. 09/18/21   Alfonse Spruce, MD  HYDROcodone-acetaminophen (NORCO/VICODIN) 5-325 MG tablet Take 1 tablet by mouth every 6 (six) hours as needed for moderate pain. Patient not taking: Reported on 09/18/2021 09/16/20   Burgess Amor, PA-C   Family History Family History  Problem Relation Age of Onset   Asthma Father    Emphysema Father        smoked   Asthma Mother    Emphysema Mother        smoked   Eczema Brother    Allergic rhinitis Neg Hx    Angioedema Neg Hx    Atopy Neg Hx    Immunodeficiency  Neg Hx    Urticaria Neg Hx     Social History Social History   Tobacco Use   Smoking status: Every Day    Packs/day: 1.00    Years: 33.00    Pack years: 33.00    Types: Cigarettes    Start date: 1988   Smokeless tobacco: Never  Vaping Use   Vaping Use: Never used  Substance Use Topics   Alcohol use: No   Drug use: No     Allergies   Wellbutrin [bupropion], Chantix [varenicline tartrate], and Penicillins   Review of Systems Review of Systems Per HPI  Physical Exam Triage Vital Signs ED Triage Vitals  Enc Vitals Group     BP 09/27/21 1049 110/64     Pulse Rate 09/27/21 1049 66     Resp 09/27/21 1049 18     Temp 09/27/21 1049 97.8 F (36.6 C)     Temp Source 09/27/21 1049 Oral     SpO2 09/27/21 1049 99 %     Weight --      Height --      Head Circumference --      Peak Flow --      Pain Score 09/27/21 1051 10     Pain Loc --      Pain Edu? --      Excl. in GC? --    No data found.  Updated Vital Signs BP 110/64 (BP Location: Right Arm)   Pulse 66   Temp 97.8 F (36.6 C) (Oral)   Resp 18   SpO2 99%   Visual Acuity Right Eye Distance:   Left Eye Distance:   Bilateral Distance:    Right Eye Near:   Left Eye Near:    Bilateral Near:     Physical Exam Vitals and nursing note reviewed.  Constitutional:      Appearance: Normal appearance.  HENT:     Head: Atraumatic.  Eyes:     Extraocular Movements: Extraocular movements intact.     Conjunctiva/sclera: Conjunctivae normal.  Cardiovascular:     Rate and Rhythm: Normal rate and regular rhythm.     Heart sounds: Normal heart sounds.  Pulmonary:     Effort: Pulmonary effort is normal.     Breath sounds: Normal breath sounds. No wheezing or rales.  Musculoskeletal:        General: Tenderness present. No swelling. Normal range of motion.     Cervical back: Normal range of motion and neck supple.     Comments: Reproduction of his chest pain upon palpation of the insertion of the right pectoral  muscle laterally.  Pain exacerbated with range of motion against resistance exercises of the right upper extremity.  Skin:    General: Skin is warm and dry.  Neurological:     General: No focal deficit present.     Mental Status: He is  oriented to person, place, and time.  Psychiatric:        Mood and Affect: Mood normal.        Thought Content: Thought content normal.        Judgment: Judgment normal.    UC Treatments / Results  Labs (all labs ordered are listed, but only abnormal results are displayed) Labs Reviewed - No data to display  EKG   Radiology No results found.  Procedures Procedures (including critical care time)  Medications Ordered in UC Medications - No data to display  Initial Impression / Assessment and Plan / UC Course  I have reviewed the triage vital signs and the nursing notes.  Pertinent labs & imaging results that were available during my care of the patient were reviewed by me and considered in my medical decision making (see chart for details).     Consistent with a pectoral strain, declines EKG today and vital signs benign and reassuring, he appears in no acute distress.  We will treat with prednisone, Flexeril, over-the-counter pain relievers, heat, stretches.  Follow-up for worsening symptoms.  Final Clinical Impressions(s) / UC Diagnoses   Final diagnoses:  Pectoralis muscle strain, initial encounter   Discharge Instructions   None    ED Prescriptions     Medication Sig Dispense Auth. Provider   predniSONE (DELTASONE) 20 MG tablet Take 2 tablets (40 mg total) by mouth daily with breakfast. 6 tablet Particia Nearing, PA-C   cyclobenzaprine (FLEXERIL) 10 MG tablet Take 1 tablet (10 mg total) by mouth 3 (three) times daily as needed for muscle spasms. Do not drink alcohol or drive while taking this medication. May cause drowsiness 15 tablet Particia Nearing, [place]      PDMP not reviewed this encounter.   Particia Nearing, [place] 09/27/21 1125

## 2021-09-30 ENCOUNTER — Other Ambulatory Visit: Payer: Self-pay

## 2021-09-30 ENCOUNTER — Ambulatory Visit
Admission: EM | Admit: 2021-09-30 | Discharge: 2021-09-30 | Disposition: A | Discharge status: Home / Self Care | Payer: Medicare HMO | Attending: Urgent Care | Admitting: Urgent Care

## 2021-09-30 ENCOUNTER — Ambulatory Visit (INDEPENDENT_AMBULATORY_CARE_PROVIDER_SITE_OTHER): Payer: Medicare HMO

## 2021-09-30 ENCOUNTER — Encounter: Payer: Self-pay | Admitting: Emergency Medicine

## 2021-09-30 DIAGNOSIS — S29011D Strain of muscle and tendon of front wall of thorax, subsequent encounter: Secondary | ICD-10-CM

## 2021-09-30 DIAGNOSIS — R079 Chest pain, unspecified: Secondary | ICD-10-CM | POA: Diagnosis not present

## 2021-09-30 DIAGNOSIS — R0789 Other chest pain: Secondary | ICD-10-CM

## 2021-09-30 MED ORDER — TIZANIDINE HCL 4 MG PO TABS: 4.0000 mg | ORAL_TABLET | Freq: Every day | ORAL | 0 refills | Status: DC

## 2021-09-30 MED ORDER — PREDNISONE 20 MG PO TABS: 40.0000 mg | ORAL_TABLET | Freq: Every day | ORAL | 0 refills | Status: DC

## 2021-09-30 NOTE — ED Provider Notes (Signed)
[facility]   MRN: [id number] DOB: May 04, 1971  Subjective:   Tony Doyle is a 50 y.o. male presenting for ongoing right-sided chest pain.  Symptoms started after he did a lot of strenuous work.  There was not any particular fall or trauma.  Last office visit was 09/27/2021.  He had discussions about x-ray and EKG but declined.  He did take an oral prednisone course which helped some but is worried that the pain will persist now that he is done.  No shortness of breath, wheezing, left-sided chest pain, neck pain, radiation of his pain to the left arm.  No bruising, swelling, rashes.  Patient does have a history of COPD but does not feel that this is the primary issue as his breathing is unchanged.  No current facility-administered medications for this encounter.  Current Outpatient Medications:    albuterol (PROVENTIL) (2.5 MG/3ML) 0.083% nebulizer solution, Take 3 mLs (2.5 mg total) by nebulization at bedtime. (Patient taking differently: Take 2.5 mg by nebulization at bedtime. As needed during the day), Disp: 90 mL, Rfl: 3   albuterol (VENTOLIN HFA) 108 (90 Base) MCG/ACT inhaler, Inhale 2 puffs into the lungs every 6 (six) hours as needed for wheezing or shortness of breath., Disp: 18 g, Rfl: 2   atorvastatin (LIPITOR) 20 MG tablet, Take 1 tablet (20 mg total) by mouth daily., Disp: 90 tablet, Rfl: 1   BREO ELLIPTA 200-25 MCG/INH AEPB, TAKE 1 PUFF BY MOUTH EVERY DAY (Patient not taking: Reported on 09/18/2021), Disp: 60 each, Rfl: 5   Budeson-Glycopyrrol-Formoterol (BREZTRI AEROSPHERE) 160-9-4.8 MCG/ACT AERO, Inhale 2 puffs into the lungs in the morning and at bedtime. (Patient taking differently: Inhale 2 puffs into the lungs as needed.), Disp: 10.7 g, Rfl: 5   cetirizine (ZYRTEC) 10 MG tablet, TAKE 1 TABLET BY MOUTH EVERY DAY, Disp: 30 tablet, Rfl: 11   cyclobenzaprine (FLEXERIL) 10 MG tablet, Take 1 tablet (10 mg total) by mouth 3 (three) times daily as needed for muscle  spasms. Do not drink alcohol or drive while taking this medication. May cause drowsiness, Disp: 15 tablet, Rfl: 0   fluticasone (FLONASE) 50 MCG/ACT nasal spray, SPRAY 2 SPRAYS INTO EACH NOSTRIL EVERY DAY (Patient taking differently: Place 2 sprays into both nostrils daily as needed for rhinitis.), Disp: 48 mL, Rfl: 1   fluticasone-salmeterol (ADVAIR) 250-50 MCG/ACT AEPB, Inhale 1 puff into the lungs in the morning and at bedtime., Disp: 1 each, Rfl: 1   HYDROcodone-acetaminophen (NORCO/VICODIN) 5-325 MG tablet, Take 1 tablet by mouth every 6 (six) hours as needed for moderate pain. (Patient not taking: No sig reported), Disp: 12 tablet, Rfl: 0   predniSONE (DELTASONE) 20 MG tablet, Take 2 tablets (40 mg total) by mouth daily with breakfast., Disp: 6 tablet, Rfl: 0   Allergies  Allergen Reactions   Wellbutrin [Bupropion]     dizziness   Chantix [Varenicline Tartrate]     Severe dreams   Penicillins Rash and Other (See Comments)    Was told he was allergic, unknown reaction Childhood/ Tried a year ago    Past Medical History:  Diagnosis Date   Asthma    COPD (chronic obstructive pulmonary disease) (HCC)    Hypercholesterolemia    Pneumonia      Past Surgical History:  Procedure Laterality Date   arm surgery     LEFT   COLONOSCOPY WITH PROPOFOL N/A 10/12/2020   Procedure: COLONOSCOPY WITH PROPOFOL;  Surgeon: Lanelle Bal, DO;  Location: [facility];  Service: Endoscopy;  Laterality: N/A;  9:30   LEG SURGERY     LEFT    Family History  Problem Relation Age of Onset   Asthma Father    Emphysema Father        smoked   Asthma Mother    Emphysema Mother        smoked   Eczema Brother    Allergic rhinitis Neg Hx    Angioedema Neg Hx    Atopy Neg Hx    Immunodeficiency Neg Hx    Urticaria Neg Hx     Social History   Tobacco Use   Smoking status: Every Day    Packs/day: 1.00    Years: 33.00    Pack years: 33.00    Types: Cigarettes    Start date: 1988    Smokeless tobacco: Never  Vaping Use   Vaping Use: Never used  Substance Use Topics   Alcohol use: No   Drug use: No    ROS   Objective:   Vitals: BP 108/69   Pulse 66   Temp 98.4 F (36.9 C)   Resp 18   SpO2 94%   Physical Exam Constitutional:      General: He is not in acute distress.    Appearance: Normal appearance. He is well-developed. He is not ill-appearing, toxic-appearing or diaphoretic.  HENT:     Head: Normocephalic and atraumatic.     Right Ear: External ear normal.     Left Ear: External ear normal.     Nose: Nose normal.     Mouth/Throat:     Mouth: Mucous membranes are moist.     Pharynx: Oropharynx is clear.  Eyes:     General: No scleral icterus.    Extraocular Movements: Extraocular movements intact.     Pupils: Pupils are equal, round, and reactive to light.  Cardiovascular:     Rate and Rhythm: Normal rate and regular rhythm.     Heart sounds: Normal heart sounds. No murmur heard.   No friction rub. No gallop.  Pulmonary:     Effort: Pulmonary effort is normal. No respiratory distress.     Breath sounds: Normal breath sounds. No stridor. No wheezing, rhonchi or rales.  Chest:     Chest wall: Tenderness (Reproducible over area outlined without ecchymosis, swelling) present.    Neurological:     Mental Status: He is alert and oriented to person, place, and time.  Psychiatric:        Mood and Affect: Mood normal.        Behavior: Behavior normal.        Thought Content: Thought content normal.   Negative chest x-ray on my reading.  Assessment and Plan :   PDMP not reviewed this encounter.  1. Chest wall muscle strain, subsequent encounter   2. Chest wall pain    Recommended an additional 2 days of the prednisone course, switch the muscle relaxant to tizanidine.  Suspect that he has a chest wall strain and therefore counseled on resting, ergonomic lifting. X-ray over-read was pending at time of discharge, recommended follow up with only  abnormal results. Otherwise will not call for negative over-read. Patient was in agreement.  Counseled patient on potential for adverse effects with medications prescribed/recommended today, ER and return-to-clinic precautions discussed, patient verbalized understanding.    Wallis Bamberg, PA-C 09/30/21 1217

## 2021-09-30 NOTE — ED Triage Notes (Signed)
Pt is present today with recurrent right chest pain. Pt states that he took the medication prescribed at the last visit but his pain is still unbearable.

## 2021-10-01 ENCOUNTER — Other Ambulatory Visit: Payer: Self-pay | Admitting: *Deleted

## 2021-10-03 LAB — ALPHA-1-ANTITRYPSIN DEFICIENCY

## 2021-10-08 ENCOUNTER — Other Ambulatory Visit: Payer: Self-pay

## 2021-10-08 ENCOUNTER — Other Ambulatory Visit (INDEPENDENT_AMBULATORY_CARE_PROVIDER_SITE_OTHER): Payer: Medicare HMO

## 2021-10-08 DIAGNOSIS — Z23 Encounter for immunization: Secondary | ICD-10-CM | POA: Diagnosis not present

## 2021-10-21 ENCOUNTER — Encounter: Payer: Self-pay | Admitting: Family Medicine

## 2021-10-21 ENCOUNTER — Other Ambulatory Visit: Payer: Self-pay

## 2021-10-21 ENCOUNTER — Ambulatory Visit (INDEPENDENT_AMBULATORY_CARE_PROVIDER_SITE_OTHER): Payer: Medicare HMO | Admitting: Family Medicine

## 2021-10-21 VITALS — BP 125/74 | HR 64 | Temp 98.8°F | Ht 68.0 in | Wt 235.0 lb

## 2021-10-21 DIAGNOSIS — Z114 Encounter for screening for human immunodeficiency virus [HIV]: Secondary | ICD-10-CM

## 2021-10-21 DIAGNOSIS — F17200 Nicotine dependence, unspecified, uncomplicated: Secondary | ICD-10-CM

## 2021-10-21 DIAGNOSIS — Z1159 Encounter for screening for other viral diseases: Secondary | ICD-10-CM | POA: Diagnosis not present

## 2021-10-21 DIAGNOSIS — E7849 Other hyperlipidemia: Secondary | ICD-10-CM | POA: Diagnosis not present

## 2021-10-21 DIAGNOSIS — R69 Illness, unspecified: Secondary | ICD-10-CM | POA: Diagnosis not present

## 2021-10-21 DIAGNOSIS — Z0000 Encounter for general adult medical examination without abnormal findings: Secondary | ICD-10-CM | POA: Diagnosis not present

## 2021-10-21 MED ORDER — AZITHROMYCIN 250 MG PO TABS: ORAL_TABLET | ORAL | 0 refills | Status: AC

## 2021-10-21 NOTE — Progress Notes (Signed)
   Subjective:    Patient ID: Tony Doyle, male    DOB: [birth year], 50 y.o.   MRN: [id number]  HPI  The patient comes in today for a wellness visit. Very nice patient We did discuss quitting smoking He is open to this He is working hard at doing so Patient has had a few weeks of respiratory illness now with coughing and congestion but denies high fevers chills His asthma is stable he states that allergist working with him on this He does take his cholesterol medicine regular basis Tolerates it well  A review of their health history was completed.  A review of medications was also completed.  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   Additional concerns: no  Review of Systems     Objective:   Physical Exam  General-in no acute distress Eyes-no discharge Lungs-respiratory rate normal, CTA CV-no murmurs,RRR Extremities skin warm dry no edema Neuro grossly normal Behavior normal, alert  Prostate exam normal     Assessment & Plan:  Pneumonia vaccine offered but it is currently out of stock we will give it on a future visit  Adult wellness-complete.wellness physical was conducted today. Importance of diet and exercise were discussed in detail.  In addition to this a discussion regarding safety was also covered. We also reviewed over immunizations and gave recommendations regarding current immunization needed for age.  In addition to this additional areas were also touched on including: Preventative health exams needed:  Colonoscopy Next one will be in 2031  Patient was advised yearly wellness exam Lab work ordered.  Await results. Patient states he is working on quitting smoking Asthma followed by allergist Takes his cholesterol medicine regular basis Follow-up  again within 6 months

## 2021-10-21 NOTE — Patient Instructions (Signed)
Steps to Quit Smoking Smoking tobacco is the leading cause of preventable death. It can affect almost every organ in the body. Smoking puts you and people around you at risk for many serious, long-lasting (chronic) diseases. Quitting smoking can be hard, but it is one of the best things that you can do for your health. It is never too late to quit. How do I get ready to quit? When you decide to quit smoking, make a plan to help you succeed. Before you quit: Pick a date to quit. Set a date within the next 2 weeks to give you time to prepare. Write down the reasons why you are quitting. Keep this list in places where you will see it often. Tell your family, friends, and co-workers that you are quitting. Their support is important. Talk with your doctor about the choices that may help you quit. Find out if your health insurance will pay for these treatments. Know the people, places, things, and activities that make you want to smoke (triggers). Avoid them. What first steps can I take to quit smoking? Throw away all cigarettes at home, at work, and in your car. Throw away the things that you use when you smoke, such as ashtrays and lighters. Clean your car. Make sure to empty the ashtray. Clean your home, including curtains and carpets. What can I do to help me quit smoking? Talk with your doctor about taking medicines and seeing a counselor at the same time. You are more likely to succeed when you do both. If you are pregnant or breastfeeding, talk with your doctor about counseling or other ways to quit smoking. Do not take medicine to help you quit smoking unless your doctor tells you to do so. To quit smoking: Quit right away Quit smoking totally, instead of slowly cutting back on how much you smoke over a period of time. Go to counseling. You are more likely to quit if you go to counseling sessions regularly. Take medicine You may take medicines to help you quit. Some medicines need a  prescription, and some you can buy over-the-counter. Some medicines may contain a drug called nicotine to replace the nicotine in cigarettes. Medicines may: Help you to stop having the desire to smoke (cravings). Help to stop the problems that come when you stop smoking (withdrawal symptoms). Your doctor may ask you to use: Nicotine patches, gum, or lozenges. Nicotine inhalers or sprays. Non-nicotine medicine that is taken by mouth. Find resources Find resources and other ways to help you quit smoking and remain smoke-free after you quit. These resources are most helpful when you use them often. They include: Online chats with a counselor. Phone quitlines. Printed self-help materials. Support groups or group counseling. Text messaging programs. Mobile phone apps. Use apps on your mobile phone or tablet that can help you stick to your quit plan. There are many free apps for mobile phones and tablets as well as websites. Examples include Quit Guide from the CDC and smokefree.gov  What things can I do to make it easier to quit?  Talk to your family and friends. Ask them to support and encourage you. Call a phone quitline (1-800-QUIT-NOW), reach out to support groups, or work with a counselor. Ask people who smoke to not smoke around you. Avoid places that make you want to smoke, such as: Bars. Parties. Smoke-break areas at work. Spend time with people who do not smoke. Lower the stress in your life. Stress can make you want to   smoke. Try these things to help your stress: Getting regular exercise. Doing deep-breathing exercises. Doing yoga. Meditating. Doing a body scan. To do this, close your eyes, focus on one area of your body at a time from head to toe. Notice which parts of your body are tense. Try to relax the muscles in those areas. How will I feel when I quit smoking? Day 1 to 3 weeks Within the first 24 hours, you may start to have some problems that come from quitting tobacco.  These problems are very bad 2-3 days after you quit, but they do not often last for more than 2-3 weeks. You may get these symptoms: Mood swings. Feeling restless, nervous, angry, or annoyed. Trouble concentrating. Dizziness. Strong desire for high-sugar foods and nicotine. Weight gain. Trouble pooping (constipation). Feeling like you may vomit (nausea). Coughing or a sore throat. Changes in how the medicines that you take for other issues work in your body. Depression. Trouble sleeping (insomnia). Week 3 and afterward After the first 2-3 weeks of quitting, you may start to notice more positive results, such as: Better sense of smell and taste. Less coughing and sore throat. Slower heart rate. Lower blood pressure. Clearer skin. Better breathing. Fewer sick days. Quitting smoking can be hard. Do not give up if you fail the first time. Some people need to try a few times before they succeed. Do your best to stick to your quit plan, and talk with your doctor if you have any questions or concerns. Summary Smoking tobacco is the leading cause of preventable death. Quitting smoking can be hard, but it is one of the best things that you can do for your health. When you decide to quit smoking, make a plan to help you succeed. Quit smoking right away, not slowly over a period of time. When you start quitting, seek help from your doctor, family, or friends. This information is not intended to replace advice given to you by your health care provider. Make sure you discuss any questions you have with your health care provider. Document Revised: 08/02/2021 Document Reviewed: 02/12/2019 Elsevier Patient Education  2022 Else[employer] Inc.  

## 2021-10-22 DIAGNOSIS — E7849 Other hyperlipidemia: Secondary | ICD-10-CM | POA: Diagnosis not present

## 2021-10-22 DIAGNOSIS — Z1159 Encounter for screening for other viral diseases: Secondary | ICD-10-CM | POA: Diagnosis not present

## 2021-10-22 DIAGNOSIS — Z0000 Encounter for general adult medical examination without abnormal findings: Secondary | ICD-10-CM | POA: Diagnosis not present

## 2021-10-22 DIAGNOSIS — R69 Illness, unspecified: Secondary | ICD-10-CM | POA: Diagnosis not present

## 2021-10-22 DIAGNOSIS — Z114 Encounter for screening for human immunodeficiency virus [HIV]: Secondary | ICD-10-CM | POA: Diagnosis not present

## 2021-10-23 LAB — BASIC METABOLIC PANEL
BUN/Creatinine Ratio: 7 — ABNORMAL LOW (ref 9–20)
BUN: 6 mg/dL (ref 6–24)
CO2: 26 mmol/L (ref 20–29)
Calcium: 9.3 mg/dL (ref 8.7–10.2)
Chloride: 99 mmol/L (ref 96–106)
Creatinine, Ser: 0.85 mg/dL (ref 0.76–1.27)
Glucose: 125 mg/dL — ABNORMAL HIGH (ref 70–99)
Potassium: 4.1 mmol/L (ref 3.5–5.2)
Sodium: 137 mmol/L (ref 134–144)
eGFR: 105 mL/min/{1.73_m2} (ref >59)

## 2021-10-23 LAB — HEPATITIS C ANTIBODY: Hep C Virus Ab: 0.2 s/co ratio (ref 0.0–0.9)

## 2021-10-23 LAB — LIPID PANEL
Chol/HDL Ratio: 4.2 ratio (ref 0.0–5.0)
Cholesterol, Total: 143 mg/dL (ref 100–199)
HDL: 34 mg/dL — ABNORMAL LOW (ref >39)
LDL Chol Calc (NIH): 87 mg/dL (ref 0–99)
Triglycerides: 118 mg/dL (ref 0–149)
VLDL Cholesterol Cal: 22 mg/dL (ref 5–40)

## 2021-10-23 LAB — HIV ANTIBODY (ROUTINE TESTING W REFLEX): HIV Screen 4th Generation wRfx: NONREACTIVE

## 2021-10-24 ENCOUNTER — Other Ambulatory Visit: Payer: Self-pay | Admitting: Family Medicine

## 2021-10-24 DIAGNOSIS — R7301 Impaired fasting glucose: Secondary | ICD-10-CM

## 2021-10-24 DIAGNOSIS — E7849 Other hyperlipidemia: Secondary | ICD-10-CM

## 2022-02-19 ENCOUNTER — Telehealth: Payer: Self-pay | Admitting: Family Medicine

## 2022-02-19 NOTE — Telephone Encounter (Signed)
Patient is requesting to see specialist for jaw pain stating not any better ?

## 2022-02-20 ENCOUNTER — Ambulatory Visit: Payer: Medicare HMO | Admitting: Physician Assistant

## 2022-02-20 NOTE — Telephone Encounter (Signed)
Pt contacted. Pt states he does not have any teeth so he knows it not anything to do with teeth. Pt states if he eats or drinks something cold, his face will swell for 2 days. . Pt states that provider told him if the pain got worse to call him and he would see what he could do about it. Please advise. Thank you ?

## 2022-02-20 NOTE — Telephone Encounter (Signed)
If the jaw pain is primarily where it meets the zygomatic arch I would recommend oral surgeon/dentist ? ? ?

## 2022-02-21 ENCOUNTER — Encounter: Payer: Self-pay | Admitting: Family Medicine

## 2022-02-21 DIAGNOSIS — R6884 Jaw pain: Secondary | ICD-10-CM

## 2022-02-21 NOTE — Telephone Encounter (Signed)
It would be reasonable to try ENT first if they are unable to find anything specific they may refer him to a oral surgeon ?

## 2022-02-21 NOTE — Telephone Encounter (Signed)
Left detailed message to return call if he would like ENT referral  ?

## 2022-02-24 NOTE — Telephone Encounter (Signed)
Nurses-please go ahead with referral to ENT for evaluation of jaw pain discomfort ?

## 2022-03-11 ENCOUNTER — Ambulatory Visit: Payer: No Typology Code available for payment source

## 2022-03-18 ENCOUNTER — Ambulatory Visit (INDEPENDENT_AMBULATORY_CARE_PROVIDER_SITE_OTHER): Payer: No Typology Code available for payment source

## 2022-03-18 VITALS — Ht 68.0 in | Wt 234.8 lb

## 2022-03-18 DIAGNOSIS — Z0000 Encounter for general adult medical examination without abnormal findings: Secondary | ICD-10-CM | POA: Diagnosis not present

## 2022-03-18 NOTE — Patient Instructions (Signed)
Tony Doyle , ?Thank you for taking time to come for your Medicare Wellness Visit. I appreciate your ongoing commitment to your health goals. Please review the following plan we discussed and let me know if I can assist you in the future.  ? ?Screening recommendations/referrals: ?Colonoscopy: Done 10/12/2020 Repeat in 10 years ? ?Recommended yearly ophthalmology/optometry visit for glaucoma screening and checkup ?Recommended yearly dental visit for hygiene and checkup ? ?Vaccinations: ?Influenza vaccine: Done 10/08/2021 Repeat annually ? ?Pneumococcal vaccine: Due at age 55. ?Tdap vaccine: Done 09/12/2018 Repeat in 10 years ? ?Shingles vaccine: Discussed. ?   ?Covid-19: Done 11/09/2020, 03/28/2020, 02/29/2020. ? ?Advanced directives: Please bring a copy of your health care power of attorney and living will to the office to be added to your chart at your convenience. ? ? ?Conditions/risks identified: KEEP UP THE GOOD WORK!! ? ?Next appointment: Follow up in one year for your annual wellness visit 2024. ? ?Preventive Care 40-64 Years, Male ?Preventive care refers to lifestyle choices and visits with your health care provider that can promote health and wellness. ?What does preventive care include? ?A yearly physical exam. This is also called an annual well check. ?Dental exams once or twice a year. ?Routine eye exams. Ask your health care provider how often you should have your eyes checked. ?Personal lifestyle choices, including: ?Daily care of your teeth and gums. ?Regular physical activity. ?Eating a healthy diet. ?Avoiding tobacco and drug use. ?Limiting alcohol use. ?Practicing safe sex. ?Taking low-dose aspirin every day starting at age 63. ?What happens during an annual well check? ?The services and screenings done by your health care provider during your annual well check will depend on your age, overall health, lifestyle risk factors, and family history of disease. ?Counseling  ?Your health care provider may ask  you questions about your: ?Alcohol use. ?Tobacco use. ?Drug use. ?Emotional well-being. ?Home and relationship well-being. ?Sexual activity. ?Eating habits. ?Work and work Astronomer. ?Screening  ?You may have the following tests or measurements: ?Height, weight, and BMI. ?Blood pressure. ?Lipid and cholesterol levels. These may be checked every 5 years, or more frequently if you are over 31 years old. ?Skin check. ?Lung cancer screening. You may have this screening every year starting at age 34 if you have a 30-pack-year history of smoking and currently smoke or have quit within the past 15 years. ?Fecal occult blood test (FOBT) of the stool. You may have this test every year starting at age 43. ?Flexible sigmoidoscopy or colonoscopy. You may have a sigmoidoscopy every 5 years or a colonoscopy every 10 years starting at age 53. ?Prostate cancer screening. Recommendations will vary depending on your family history and other risks. ?Hepatitis C blood test. ?Hepatitis B blood test. ?Sexually transmitted disease (STD) testing. ?Diabetes screening. This is done by checking your blood sugar (glucose) after you have not eaten for a while (fasting). You may have this done every 1-3 years. ?Discuss your test results, treatment options, and if necessary, the need for more tests with your health care provider. ?Vaccines  ?Your health care provider may recommend certain vaccines, such as: ?Influenza vaccine. This is recommended every year. ?Tetanus, diphtheria, and acellular pertussis (Tdap, Td) vaccine. You may need a Td booster every 10 years. ?Zoster vaccine. You may need this after age 70. ?Pneumococcal 13-valent conjugate (PCV13) vaccine. You may need this if you have certain conditions and have not been vaccinated. ?Pneumococcal polysaccharide (PPSV23) vaccine. You may need one or two doses if you smoke cigarettes or if  you have certain conditions. ?Talk to your health care provider about which screenings and vaccines  you need and how often you need them. ?This information is not intended to replace advice given to you by your health care provider. Make sure you discuss any questions you have with your health care provider. ?Document Released: 12/21/2015 Document Revised: 08/13/2016 Document Reviewed: 09/25/2015 ?Elsevier Interactive Patient Education ? 2017 Else[employer] Inc. ? ?Fall Prevention in the Home ?Falls can cause injuries. They can happen to people of all ages. There are many things you can do to make your home safe and to help prevent falls. ?What can I do on the outside of my home? ?Regularly fix the edges of walkways and driveways and fix any cracks. ?Remove anything that might make you trip as you walk through a door, such as a raised step or threshold. ?Trim any bushes or trees on the path to your home. ?Use bright outdoor lighting. ?Clear any walking paths of anything that might make someone trip, such as rocks or tools. ?Regularly check to see if handrails are loose or broken. Make sure that both sides of any steps have handrails. ?Any raised decks and porches should have guardrails on the edges. ?Have any leaves, snow, or ice cleared regularly. ?Use sand or salt on walking paths during winter. ?Clean up any spills in your garage right away. This includes oil or grease spills. ?What can I do in the bathroom? ?Use night lights. ?Install grab bars by the toilet and in the tub and shower. Do not use towel bars as grab bars. ?Use non-skid mats or decals in the tub or shower. ?If you need to sit down in the shower, use a plastic, non-slip stool. ?Keep the floor dry. Clean up any water that spills on the floor as soon as it happens. ?Remove soap buildup in the tub or shower regularly. ?Attach bath mats securely with double-sided non-slip rug tape. ?Do not have throw rugs and other things on the floor that can make you trip. ?What can I do in the bedroom? ?Use night lights. ?Make sure that you have a light by your bed that  is easy to reach. ?Do not use any sheets or blankets that are too big for your bed. They should not hang down onto the floor. ?Have a firm chair that has side arms. You can use this for support while you get dressed. ?Do not have throw rugs and other things on the floor that can make you trip. ?What can I do in the kitchen? ?Clean up any spills right away. ?Avoid walking on wet floors. ?Keep items that you use a lot in easy-to-reach places. ?If you need to reach something above you, use a strong step stool that has a grab bar. ?Keep electrical cords out of the way. ?Do not use floor polish or wax that makes floors slippery. If you must use wax, use non-skid floor wax. ?Do not have throw rugs and other things on the floor that can make you trip. ?What can I do with my stairs? ?Do not leave any items on the stairs. ?Make sure that there are handrails on both sides of the stairs and use them. Fix handrails that are broken or loose. Make sure that handrails are as long as the stairways. ?Check any carpeting to make sure that it is firmly attached to the stairs. Fix any carpet that is loose or worn. ?Avoid having throw rugs at the top or bottom of the stairs. If  you do have throw rugs, attach them to the floor with carpet tape. ?Make sure that you have a light switch at the top of the stairs and the bottom of the stairs. If you do not have them, ask someone to add them for you. ?What else can I do to help prevent falls? ?Wear shoes that: ?Do not have high heels. ?Have rubber bottoms. ?Are comfortable and fit you well. ?Are closed at the toe. Do not wear sandals. ?If you use a stepladder: ?Make sure that it is fully opened. Do not climb a closed stepladder. ?Make sure that both sides of the stepladder are locked into place. ?Ask someone to hold it for you, if possible. ?Clearly mark and make sure that you can see: ?Any grab bars or handrails. ?First and last steps. ?Where the edge of each step is. ?Use tools that help you  move around (mobility aids) if they are needed. These include: ?Canes. ?Walkers. ?Scooters. ?Crutches. ?Turn on the lights when you go into a dark area. Replace any light bulbs as soon as they burn out.

## 2022-03-18 NOTE — Progress Notes (Signed)
? ?Subjective:  ? Tony Doyle is a 51 y.o. male who presents for an Initial Medicare Annual Wellness Visit. ? ?Review of Systems    ? ?Cardiac Risk Factors include: dyslipidemia;male gender;smoking/ tobacco exposure ? ?   ?Objective:  ?  ?Today's Vitals  ? 03/18/22 0955  ?Weight: 234 lb 12.8 oz (106.5 kg)  ?Height: 5\' 8"  (1.727 m)  ? ?Body mass index is 35.7 kg/m?. ? ? ?  03/18/2022  ? 10:05 AM 04/26/2021  ? 11:11 AM 10/12/2020  ?  8:26 AM 09/16/2020  ?  8:52 AM 06/04/2020  ?  5:57 PM 02/15/2020  ?  6:13 AM 08/31/2019  ?  3:36 PM  ?Advanced Directives  ?Does Patient Have a Medical Advance Directive? Yes No No No No No No  ?Type of Paramedic of [place];Living will        ?Copy of [place] in Chart? No - copy requested        ?Would patient like information on creating a medical advance directive?  No - Patient declined Yes (MAU/Ambulatory/Procedural Areas - Information given)    No - Patient declined  ? ? ?Current Medications (verified) ?Outpatient Encounter Medications as of 03/18/2022  ?Medication Sig  ? albuterol (PROVENTIL) (2.5 MG/3ML) 0.083% nebulizer solution Take 3 mLs (2.5 mg total) by nebulization at bedtime. (Patient taking differently: Take 2.5 mg by nebulization at bedtime. As needed during the day)  ? albuterol (VENTOLIN HFA) 108 (90 Base) MCG/ACT inhaler Inhale 2 puffs into the lungs every 6 (six) hours as needed for wheezing or shortness of breath.  ? atorvastatin (LIPITOR) 20 MG tablet Take 1 tablet (20 mg total) by mouth daily.  ? BREO ELLIPTA 200-25 MCG/INH AEPB TAKE 1 PUFF BY MOUTH EVERY DAY  ? Budeson-Glycopyrrol-Formoterol (BREZTRI AEROSPHERE) 160-9-4.8 MCG/ACT AERO Inhale 2 puffs into the lungs in the morning and at bedtime.  ? cetirizine (ZYRTEC) 10 MG tablet TAKE 1 TABLET BY MOUTH EVERY DAY  ? fluticasone (FLONASE) 50 MCG/ACT nasal spray SPRAY 2 SPRAYS INTO EACH NOSTRIL EVERY DAY (Patient taking differently: Place 2 sprays into both nostrils daily as  needed for rhinitis.)  ? fluticasone-salmeterol (ADVAIR) 250-50 MCG/ACT AEPB Inhale 1 puff into the lungs in the morning and at bedtime.  ? ?No facility-administered encounter medications on file as of 03/18/2022.  ? ? ?Allergies (verified) ?Wellbutrin [bupropion], Chantix [varenicline tartrate], and Penicillins  ? ?History: ?Past Medical History:  ?Diagnosis Date  ? Asthma   ? COPD (chronic obstructive pulmonary disease) ([place])   ? Hypercholesterolemia   ? Pneumonia   ? ?Past Surgical History:  ?Procedure Laterality Date  ? arm surgery    ? LEFT  ? COLONOSCOPY WITH PROPOFOL N/A 10/12/2020  ? Procedure: COLONOSCOPY WITH PROPOFOL;  Surgeon: Eloise Harman, DO;  Location: [facility];  Service: Endoscopy;  Laterality: N/A;  9:30  ? LEG SURGERY    ? LEFT  ? ?Family History  ?Problem Relation Age of Onset  ? Asthma Father   ? Emphysema Father   ?     smoked  ? Asthma Mother   ? Emphysema Mother   ?     smoked  ? Eczema Brother   ? Allergic rhinitis Neg Hx   ? Angioedema Neg Hx   ? Atopy Neg Hx   ? Immunodeficiency Neg Hx   ? Urticaria Neg Hx   ? ?Social History  ? ?Socioeconomic History  ? Marital status: Married  ?  Spouse name:  Carmell [place]  ? Number of children: 2  ? Years of education: 8  ? Highest education level: Not on file  ?Occupational History  ? 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 Smoking status: Every Day  ?  Packs/day: 1.00  ?  Years: 33.00  ?  Pack years: 33.00  ?  Types: Cigarettes  ?  Start date: 15  ? Smokeless tobacco: Never  ?Vaping Use  ? Vaping Use: Never used  ?Substance and Sexual Activity  ? Alcohol use: No  ? Drug use: No  ? Sexual activity: Yes  ?Other Topics Concern  ? Not on file  ?Social History Narrative  ? 1 son and 1 daughter.  ? Married x 14 years, 03/14/2022 but have been together over 20 years per pt.   ? ?Social Determinants of Health  ? ?Financial Resource Strain: Low Risk   ? Difficulty of Paying Living Expenses: Not hard at all  ?Food Insecurity:  No Food Insecurity  ? Worried About Charity fundraiser in the Last Year: Never true  ? Ran Out of Food in the Last Year: Never true  ?Transportation Needs: No Transportation Needs  ? Lack of Transportation (Medical): No  ? Lack of Transportation (Non-Medical): No  ?Physical Activity: Sufficiently Active  ? Days of Exercise per Week: 7 days  ? Minutes of Exercise per Session: 40 min  ?Stress: No Stress Concern Present  ? Feeling of Stress : Not at all  ?Social Connections: Moderately Isolated  ? Frequency of Communication with Friends and Family: More than three times a week  ? Frequency of Social Gatherings with Friends and Family: More than three times a week  ? Attends Religious Services: Never  ? Active Member of Clubs or Organizations: No  ? Attends Archivist Meetings: Never  ? Marital Status: Married  ? ? ?Tobacco Counseling ?Ready to quit: Not Answered ?Counseling given: Not Answered ? ? ?Clinical Intake: ? ?Pre-visit preparation completed: Yes ? ?Pain : No/denies pain ? ?  ? ?BMI - recorded: 35.7 ?Nutritional Status: BMI > 30  Obese ?Nutritional Risks: None ?Diabetes: No ? ?How often do you need to have someone help you when you read instructions, pamphlets, or other written materials from your doctor or pharmacy?: 1 - Never ? ?Diabetic?NO ? ?  ? ?Information entered by :: mj Hardy Harcum, lpn ? ? ?Activities of Daily Living ? ?  03/18/2022  ? 10:07 AM 03/10/2022  ?  5:54 PM  ?In your present state of health, do you have any difficulty performing the following activities:  ?Hearing? 0 0  ? 0  ?Vision? 0 0  ? 0  ?Difficulty concentrating or making decisions? 0 0  ? 0  ?Walking or climbing stairs? 0 0  ? 0  ?Dressing or bathing? 0 0  ? 0  ?Doing errands, shopping? 0 0  ? 0  ?Preparing Food and eating ? N N  ? N  ?Using the Toilet? N N  ? N  ?In the past six months, have you accidently leaked urine? N N  ? N  ?Do you have problems with loss of bowel control? N N  ? N  ?Managing your Medications? N N  ? N   ?Managing your Finances? N N  ? N  ?Housekeeping or managing your Housekeeping? N N  ? N  ? ? ?Patient Care Team: ?Kathyrn Drown, MD as PCP - General (Family Medicine) ?Rourk, Cristopher Estimable, MD as  Consulting Physician (Gastroenterology) ? ?Indicate any recent Medical Services you may have received from other than [facility] providers in the past year (date may be approximate). ? ?   ?Assessment:  ? This is a routine wellness examination for Tony Doyle. ? ?Hearing/Vision screen ?Hearing Screening - Comments:: No hearing issues.  ? ?Vision Screening - Comments:: No glasses. Needs eye exam. States he will make appointment.  ? ?Dietary issues and exercise activities discussed: ?Current Exercise Habits: Home exercise routine, Type of exercise: walking, Time (Minutes): 40, Frequency (Times/Week): 7, Weekly Exercise (Minutes/Week): 280, Intensity: Moderate, Exercise limited by: cardiac condition(s) ? ? Goals Addressed   ? ?  ?  ?  ?  ? This Visit's Progress  ?  Exercise 3x per week (30 min per time)     ?  Continue to exercise and stay healthy. ?  ? ?  ? ?Depression Screen ? ?  03/18/2022  ? 10:01 AM 10/21/2021  ?  1:36 PM 05/01/2021  ? 11:04 AM 03/19/2021  ?  2:29 PM  ?PHQ 2/9 Scores  ?PHQ - 2 Score 0 0 0 0  ?  ?Fall Risk ? ?  03/18/2022  ? 10:06 AM 03/10/2022  ?  5:54 PM 10/21/2021  ?  1:35 PM 05/01/2021  ? 11:04 AM 03/19/2021  ?  2:29 PM  ?Fall Risk   ?Falls in the past year? 0 0  ? 0 0 0 0  ?Number falls in past yr: 0 0  ? 0 0 0 0  ?Injury with Fall? 0 0  ? 0 0 0 0  ?Risk for fall due to : No Fall Risks  No Fall Risks No Fall Risks   ?Follow up Falls prevention discussed  Falls evaluation completed Falls evaluation completed Falls evaluation completed  ? ? ?FALL RISK PREVENTION PERTAINING TO THE HOME: ? ?Any stairs in or around the home? Yes  ?If so, are there any without handrails? No  ?Home free of loose throw rugs in walkways, pet beds, electrical cords, etc? Yes  ?Adequate lighting in your home to reduce risk of falls? Yes  ? ?ASSISTIVE  DEVICES UTILIZED TO PREVENT FALLS: ? ?Life alert? No  ?Use of a cane, walker or w/c? No  ?Grab bars in the bathroom? No  ?Shower chair or bench in shower? No  ?Elevated toilet seat or a handicapped toi

## 2022-03-26 ENCOUNTER — Encounter: Payer: Self-pay | Admitting: Allergy & Immunology

## 2022-03-26 ENCOUNTER — Ambulatory Visit (INDEPENDENT_AMBULATORY_CARE_PROVIDER_SITE_OTHER): Payer: No Typology Code available for payment source | Admitting: Allergy & Immunology

## 2022-03-26 ENCOUNTER — Telehealth: Payer: Self-pay | Admitting: *Deleted

## 2022-03-26 ENCOUNTER — Other Ambulatory Visit: Payer: Self-pay

## 2022-03-26 VITALS — BP 120/72 | HR 66 | Resp 18

## 2022-03-26 DIAGNOSIS — E8801 Alpha-1-antitrypsin deficiency: Secondary | ICD-10-CM | POA: Diagnosis not present

## 2022-03-26 DIAGNOSIS — J310 Chronic rhinitis: Secondary | ICD-10-CM

## 2022-03-26 DIAGNOSIS — Z122 Encounter for screening for malignant neoplasm of respiratory organs: Secondary | ICD-10-CM | POA: Diagnosis not present

## 2022-03-26 DIAGNOSIS — J4489 Other specified chronic obstructive pulmonary disease: Secondary | ICD-10-CM

## 2022-03-26 DIAGNOSIS — J449 Chronic obstructive pulmonary disease, unspecified: Secondary | ICD-10-CM

## 2022-03-26 MED ORDER — ALBUTEROL SULFATE HFA 108 (90 BASE) MCG/ACT IN AERS: 2.0000 | INHALATION_SPRAY | Freq: Four times a day (QID) | RESPIRATORY_TRACT | 2 refills | Status: DC | PRN

## 2022-03-26 MED ORDER — FORMOTEROL FUMARATE 20 MCG/2ML IN NEBU: 20.0000 ug | INHALATION_SOLUTION | Freq: Every day | RESPIRATORY_TRACT | 5 refills | Status: DC

## 2022-03-26 MED ORDER — BUDESONIDE 0.5 MG/2ML IN SUSP: 0.5000 mg | Freq: Every day | RESPIRATORY_TRACT | 5 refills | Status: DC

## 2022-03-26 MED ORDER — ALBUTEROL SULFATE (2.5 MG/3ML) 0.083% IN NEBU: 2.5000 mg | INHALATION_SOLUTION | Freq: Every evening | RESPIRATORY_TRACT | 3 refills | Status: DC

## 2022-03-26 NOTE — Telephone Encounter (Signed)
Working on [place] for Chest CT. Will keep patient updated.  ?

## 2022-03-26 NOTE — Telephone Encounter (Signed)
-----   Message from Valentina Shaggy, MD sent at 03/26/2022  2:20 PM EDT ----- ?Tony Doyle's lung function is declining. Because of that as well as his smoking history, I think he needs a lung CT for screening purposes.  ?

## 2022-03-26 NOTE — Patient Instructions (Addendum)
1. Moderate persistent asthma, uncomplicated ?- Lung testing looked stable.  ?- We are going to change to Pulmicort + Perforomist mixed together at night (this is an inhaled steroid and a long acting albuterol).  ?- If this is too expensive, call us and we can work on the [place] and Me form again.  ?- Daily controller medication(s): Pulmicort + Perforomist mixed together at night ?- Prior to physical activity: albuterol 2 puffs 10-15 minutes before physical activity. ?- Rescue medications: albuterol 4 puffs every 4-6 hours as needed or albuterol nebulizer one vial every 4-6 hours as needed ?- Asthma control goals:  ?* Full participation in all desired activities (may need albuterol before activity) ?* Albuterol use two time or less a week on average (not counting use with activity) ?* Cough interfering with sleep two time or less a month ?* Oral steroids no more than once a year ?* No hospitalizations ? ?2. Chronic non-allergic rhinitis ?- Continue with: Flonase (fluticasone) one spray per nostril daily (but use EVERY DAY for the best effect) and Zyrtec (cetirizine) 10mg  tablet once daily (DURING WORST TIMES OF THE YEAR ONLY) ?- Consider nasal saline rinses 1-2 times daily to remove allergens from the nasal cavities as well as help with mucous clearance (this is especially helpful to do before the nasal sprays are given) ? ?3. Return in about 1 year (around 03/27/2023).  ? ? ?Please inform us of any Emergency Department visits, hospitalizations, or changes in symptoms. Call us before going to the ED for breathing or allergy symptoms since we might be able to fit you in for a sick visit. Feel free to contact us anytime with any questions, problems, or concerns. ? ?It was a pleasure to see you again today! ? ?Websites that have reliable patient information: ?1. American Academy of Asthma, Allergy, and Immunology: www.aaaai.org ?2. Food Allergy Research and Education (FARE): foodallergy.org ?3. Mothers of Asthmatics:  http://www.asthmacommunitynetwork.org ?4. [employer] of Allergy, Asthma, and Immunology: MonthlyElectricBill.co.uk ? ? ?COVID-19 Vaccine Information can be found at: ShippingScam.co.uk For questions related to vaccine distribution or appointments, please email [email or web address] or call [phone].  ? ? ? ??Like? [place] on Facebook and Instagram for our latest updates!  ?  ? ? ? ?Make sure you are registered to vote! If you have moved or changed any of your contact information, you will need to get this updated before voting! ? ?In some cases, you MAY be able to register to vote online: [email or web address] ? ? ? ? ?

## 2022-03-26 NOTE — Progress Notes (Signed)
? ?FOLLOW UP ? ?Date of Service/Encounter:  03/26/22 ? ? ?Assessment:  ? ?Asthma with COPD overlap ?  ?Chronic rhinitis  ?  ?Current smoker - but decreasing amount with his wife ?  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 ?Chest CT ordered due to history of smoking and MZ phenotype  ? ?Plan/Recommendations:  ? ?1. Moderate persistent asthma, uncomplicated ?- Lung testing looked stable.  ?- We are going to change to Pulmicort + Perforomist mixed together at night (this is an inhaled steroid and a long acting albuterol).  ?- If this is too expensive, call us and we can work on the [place] and [place] form again.  ?- Daily controller medication(s): Pulmicort + Perforomist mixed together at night ?- Prior to physical activity: albuterol 2 puffs 10-15 minutes before physical activity. ?- Rescue medications: albuterol 4 puffs every 4-6 hours as needed or albuterol nebulizer one vial every 4-6 hours as needed ?- Asthma control goals:  ?* Full participation in all desired activities (may need albuterol before activity) ?* Albuterol use two time or less a week on average (not counting use with activity) ?* Cough interfering with sleep two time or less a month ?* Oral steroids no more than once a year ?* No hospitalizations ? ?2. Chronic non-allergic rhinitis ?- Continue with: Flonase (fluticasone) one spray per nostril daily (but use EVERY DAY for the best effect) and Zyrtec (cetirizine) 10mg  tablet once daily (DURING WORST TIMES OF THE YEAR ONLY) ?- Consider nasal saline rinses 1-2 times daily to remove allergens from the nasal cavities as well as help with mucous clearance (this is especially helpful to do before the nasal sprays are given) ? ?3. Return in about 1 year (around 03/27/2023).  ? ? ? ?Subjective:  ? ?Tony Doyle is a 51 y.o. male presenting today for follow up of  ?Chief Complaint  ?Patient presents with  ? Asthma  ? Cough  ?  At night while in bed  ? ? ?Tony Doyle has a  history of the following: ?Patient Active Problem List  ? Diagnosis Date Noted  ? LDL-c greater than or equal to 100 mg/dl Tony Doyle  ? Viral upper respiratory tract infection 01/14/2021  ? Asthma with COPD ([facility]) 09/12/2020  ? Non-allergic rhinitis 09/12/2020  ? Hyperlipidemia 12/13/2018  ? Chest pain, musculoskeletal 02/22/2014  ? Smoker 02/22/2014  ? Foot contusion 08/23/2012  ? ? ?History obtained from: chart review and patient. ? ?Tony Doyle is a 51 y.o. male presenting for a follow up visit.  We last saw him in October 2022.  At that time, his lung testing looks stable.  We changed to Advair to check to see if that was covered better.  We also obtain labs to look for alpha-1 antitrypsin deficiency due to a family history of such.  For the nonallergic rhinitis, would continue with Flonase and Zyrtec, with the Zyrtec being used only during the worst times of the year.  We also continued with nasal saline rinses as needed. ? 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  ? ?In the interim, he has done well.  ? ?Asthma/Respiratory Symptom History: The cheapest one is $50 a month.  He is fine during the day. But the night time is worse. He ends up sleeping in th erecliner because of the SOB. He is having to use the albuterol nebulizer machine to help him sleep. He will sleep  a couple of hours at night at the most. He has not been able to afford any medications. He never heard back from [place].  ? ?His mother is having a hard time. She has alpha 1 antitrypsin deficiency and she is on chronic prednisone now. She is not doing well from the sounds of it.  ? ?Allergic Rhinitis Symptom History: He remains on Flonase and cetirizine. He has not been on antibiotics for anything at all. He feels that his rhinitis is fairly well controlled.  ? ?Otherwise, there have been no changes to his past medical history, surgical history, family history, or social history. ? ? ? ?Review of  Systems  ?Constitutional: Negative.  Negative for chills, fever, malaise/fatigue and weight loss.  ?HENT: Negative.  Negative for congestion, ear discharge, ear pain and sinus pain.   ?Eyes:  Negative for pain, discharge and redness.  ?Respiratory:  Negative for cough, sputum production, shortness of breath, wheezing and stridor.   ?Cardiovascular: Negative.  Negative for chest pain and palpitations.  ?Gastrointestinal:  Negative for abdominal pain, diarrhea, heartburn, nausea and vomiting.  ?Skin: Negative.  Negative for itching and rash.  ?Neurological:  Negative for dizziness and headaches.  ?Endo/Heme/Allergies:  Negative for environmental allergies. Does not bruise/bleed easily.   ? ? ? ?Objective:  ? ?Blood pressure 120/72, pulse 66, resp. rate 18, SpO2 96 %. ?There is no height or weight on file to calculate BMI. ? ? ? ?Physical Exam ?Vitals reviewed.  ?Constitutional:   ?   Appearance: He is well-developed.  ?   Comments: Laughing and boisterous. Asking whether I have his Happy Meal.   ?HENT:  ?   Head: Normocephalic and atraumatic.  ?   Right Ear: Tympanic membrane, ear canal and external ear normal.  ?   Left Ear: Tympanic membrane, ear canal and external ear normal.  ?   Nose: No nasal deformity, septal deviation, mucosal edema or rhinorrhea.  ?   Right Turbinates: Enlarged, swollen and pale.  ?   Left Turbinates: Enlarged, swollen and pale.  ?   Right Sinus: No maxillary sinus tenderness or frontal sinus tenderness.  ?   Left Sinus: No maxillary sinus tenderness or frontal sinus tenderness.  ?   Comments: Dried crusty mucous bilaterally.  ?   Mouth/Throat:  ?   Mouth: Mucous membranes are not pale and not dry.  ?   Pharynx: Uvula midline.  ?Eyes:  ?   General: Lids are normal. Allergic shiner present.     ?   Right eye: No discharge.     ?   Left eye: No discharge.  ?   Conjunctiva/sclera: Conjunctivae normal.  ?   Right eye: Right conjunctiva is not injected. No chemosis. ?   Left eye: Left conjunctiva  is not injected. No chemosis. ?   Pupils: Pupils are equal, round, and reactive to light.  ?Cardiovascular:  ?   Rate and Rhythm: Normal rate and regular rhythm.  ?   Heart sounds: Normal heart sounds.  ?Pulmonary:  ?   Effort: Pulmonary effort is normal. No tachypnea, accessory muscle usage or respiratory distress.  ?   Breath sounds: Examination of the right-lower field reveals decreased breath sounds. Examination of the left-lower field reveals decreased breath sounds. Decreased breath sounds present. No wheezing, rhonchi or rales.  ?Chest:  ?   Chest wall: No tenderness.  ?Lymphadenopathy:  ?   Cervical: No cervical adenopathy.  ?Skin: ?   Coloration: Skin is not pale.  ?  Findings: No abrasion, erythema, petechiae or rash. Rash is not papular, urticarial or vesicular.  ?Neurological:  ?   Mental Status: He is alert.  ?Psychiatric:     ?   Behavior: Behavior is cooperative.  ?  ? ?Diagnostic studies:   ? ?Spirometry: results abnormal (FEV1: 1.49/41%, FVC: 2.23/49%, FEV1/FVC: 67%).  ?  ?Spirometry consistent with mixed obstructive and restrictive disease. Overall values are lower than those obtained at the last visit, although he has not been on controller medications.  ? ?Allergy Studies: none ? ? ? ?  ?Malachi Bonds, MD  ?[facility] ? ? ? ? ? ? ?

## 2022-03-27 NOTE — Addendum Note (Signed)
Addended by: Orson Aloe on: 03/27/2022 05:23 PM ? ? Modules accepted: Orders ? ?

## 2022-03-28 NOTE — Telephone Encounter (Signed)
Called and spoke with insurance. They advised that a PA was not needed. Reference number is [id number]. MyChart message sent to patient advising. Called scheduling and advised.  ?

## 2022-04-04 ENCOUNTER — Other Ambulatory Visit: Payer: Self-pay | Admitting: Family Medicine

## 2022-04-04 ENCOUNTER — Other Ambulatory Visit: Payer: Self-pay | Admitting: *Deleted

## 2022-04-04 DIAGNOSIS — Z789 Other specified health status: Secondary | ICD-10-CM

## 2022-04-04 MED ORDER — ATORVASTATIN CALCIUM 20 MG PO TABS: 20.0000 mg | ORAL_TABLET | Freq: Every day | ORAL | 0 refills | Status: DC

## 2022-04-21 ENCOUNTER — Ambulatory Visit (INDEPENDENT_AMBULATORY_CARE_PROVIDER_SITE_OTHER): Payer: No Typology Code available for payment source | Admitting: Family Medicine

## 2022-04-21 ENCOUNTER — Encounter: Payer: Self-pay | Admitting: Family Medicine

## 2022-04-21 VITALS — BP 124/68 | HR 63 | Temp 98.3°F | Wt 231.6 lb

## 2022-04-21 DIAGNOSIS — Z79899 Other long term (current) drug therapy: Secondary | ICD-10-CM | POA: Diagnosis not present

## 2022-04-21 DIAGNOSIS — E7849 Other hyperlipidemia: Secondary | ICD-10-CM | POA: Diagnosis not present

## 2022-04-21 DIAGNOSIS — R7301 Impaired fasting glucose: Secondary | ICD-10-CM | POA: Diagnosis not present

## 2022-04-21 DIAGNOSIS — Z789 Other specified health status: Secondary | ICD-10-CM | POA: Diagnosis not present

## 2022-04-21 DIAGNOSIS — F17200 Nicotine dependence, unspecified, uncomplicated: Secondary | ICD-10-CM | POA: Diagnosis not present

## 2022-04-21 MED ORDER — ATORVASTATIN CALCIUM 20 MG PO TABS: 20.0000 mg | ORAL_TABLET | Freq: Every day | ORAL | 1 refills | Status: DC

## 2022-04-21 NOTE — Progress Notes (Signed)
? ?  Subjective:  ? ? Patient ID: Tony Doyle, male    DOB: 07/04/1971, 51 y.o.   MRN: [id number] ? ?HPI ?Pt here for follow up. Pt states things are going well. Pt taking all meds as directed. No issues with medications.  ? ?Patient still smokes.  He knows he needs to quit smoking.  In the past he has tried Wellbutrin and has tried Chantix unfortunately neither have allowed him to quit because of side effects ? ?Denies any chest pain shortness of breath hematuria rectal bleeding ?Review of Systems ? ?   ?Objective:  ? Physical Exam ? ?General-in no acute distress ?Eyes-no discharge ?Lungs-respiratory rate normal, CTA ?CV-no murmurs,RRR ?Extremities skin warm dry no edema ?Neuro grossly normal ?Behavior normal, alert ? ? ? ?   ?Assessment & Plan:  ?1. LDL-c greater than or equal to 100 mg/dl ?Very important to get cholesterol down check lipid profile await results continue current medication ? ?2. Other hyperlipidemia ?See above tolerating well ?- atorvastatin (LIPITOR) 20 MG tablet; Take 1 tablet (20 mg total) by mouth daily.  Dispense: 90 tablet; Refill: 1 ?- Lipid Profile ?- Hemoglobin A1c ?- Hepatic function panel ?- Basic Metabolic Panel (BMET) ? ?3. Fasting hyperglycemia ?Check A1c to make sure patient does not develop in the diabetes previous glucose moderately elevated ?- Lipid Profile ?- Hemoglobin A1c ?- Hepatic function panel ?- Basic Metabolic Panel (BMET) ? ?4. High risk medication use ?Labs ordered ?- Lipid Profile ?- Hemoglobin A1c ?- Hepatic function panel ?- Basic Metabolic Panel (BMET) ? ?5. Smoker ?Encouraged low-dose CT scan ?Patient was open to this ? ?- CT CHEST LUNG CA SCREEN LOW DOSE W/O CM ? ? ?

## 2022-04-23 ENCOUNTER — Encounter (HOSPITAL_COMMUNITY): Payer: Self-pay

## 2022-04-23 ENCOUNTER — Other Ambulatory Visit (HOSPITAL_COMMUNITY): Payer: Self-pay

## 2022-04-23 DIAGNOSIS — Z122 Encounter for screening for malignant neoplasm of respiratory organs: Secondary | ICD-10-CM

## 2022-04-23 DIAGNOSIS — Z87891 Personal history of nicotine dependence: Secondary | ICD-10-CM

## 2022-04-23 NOTE — Progress Notes (Signed)
Received referral for initial lung cancer screening scan. Contacted patient and obtained smoking history (started age 51, current smoker continuing to smoke 1PPD, patient previously smoked 1.5PPD when he started smoking for about 5 years and has been smoking 1PPD after that. 37.5 pack year) as well as answering questions related to the screening process. Patient denies signs/symptoms of lung cancer such as weight loss or hemoptysis. Patient denies comorbidity that would prevent curative treatment if lung cancer were to be found. Patient is scheduled for shared decision making visit and CT scan on 05/20/2022 at 1100.  ? ?

## 2022-05-06 DIAGNOSIS — R7301 Impaired fasting glucose: Secondary | ICD-10-CM | POA: Diagnosis not present

## 2022-05-06 DIAGNOSIS — E7849 Other hyperlipidemia: Secondary | ICD-10-CM | POA: Diagnosis not present

## 2022-05-06 DIAGNOSIS — Z79899 Other long term (current) drug therapy: Secondary | ICD-10-CM | POA: Diagnosis not present

## 2022-05-07 LAB — LIPID PANEL
Chol/HDL Ratio: 5.1 ratio — ABNORMAL HIGH (ref 0.0–5.0)
Cholesterol, Total: 157 mg/dL (ref 100–199)
HDL: 31 mg/dL — ABNORMAL LOW (ref >39)
LDL Chol Calc (NIH): 80 mg/dL (ref 0–99)
Triglycerides: 276 mg/dL — ABNORMAL HIGH (ref 0–149)
VLDL Cholesterol Cal: 46 mg/dL — ABNORMAL HIGH (ref 5–40)

## 2022-05-07 LAB — BASIC METABOLIC PANEL
BUN/Creatinine Ratio: 11 (ref 9–20)
BUN: 9 mg/dL (ref 6–24)
CO2: 22 mmol/L (ref 20–29)
Calcium: 9.6 mg/dL (ref 8.7–10.2)
Chloride: 103 mmol/L (ref 96–106)
Creatinine, Ser: 0.85 mg/dL (ref 0.76–1.27)
Glucose: 110 mg/dL — ABNORMAL HIGH (ref 70–99)
Potassium: 4.6 mmol/L (ref 3.5–5.2)
Sodium: 141 mmol/L (ref 134–144)
eGFR: 105 mL/min/{1.73_m2} (ref >59)

## 2022-05-07 LAB — HEPATIC FUNCTION PANEL
ALT: 29 IU/L (ref 0–44)
AST: 28 IU/L (ref 0–40)
Albumin: 4.4 g/dL (ref 3.8–4.9)
Alkaline Phosphatase: 108 IU/L (ref 44–121)
Bilirubin Total: 0.3 mg/dL (ref 0.0–1.2)
Bilirubin, Direct: <0.1 mg/dL (ref 0.00–0.40)
Total Protein: 7.2 g/dL (ref 6.0–8.5)

## 2022-05-07 LAB — HEMOGLOBIN A1C
Est. average glucose Bld gHb Est-mCnc: 117 mg/dL
Hgb A1c MFr Bld: 5.7 % — ABNORMAL HIGH (ref 4.8–5.6)

## 2022-05-08 ENCOUNTER — Encounter: Payer: Self-pay | Admitting: Family Medicine

## 2022-05-19 ENCOUNTER — Telehealth: Payer: Self-pay

## 2022-05-19 NOTE — Telephone Encounter (Signed)
Caller name:Christpher Bland Span   On DPR? 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

## 2022-05-20 ENCOUNTER — Telehealth: Payer: Self-pay | Admitting: Family Medicine

## 2022-05-20 ENCOUNTER — Ambulatory Visit (HOSPITAL_COMMUNITY)
Admission: RE | Admit: 2022-05-20 | Discharge: 2022-05-20 | Disposition: A | Discharge status: Home / Self Care | Payer: No Typology Code available for payment source | Source: Ambulatory Visit | Attending: Physician Assistant | Admitting: Physician Assistant

## 2022-05-20 ENCOUNTER — Inpatient Hospital Stay (HOSPITAL_COMMUNITY): Payer: No Typology Code available for payment source | Attending: Physician Assistant | Admitting: Physician Assistant

## 2022-05-20 DIAGNOSIS — Z122 Encounter for screening for malignant neoplasm of respiratory organs: Secondary | ICD-10-CM | POA: Diagnosis not present

## 2022-05-20 DIAGNOSIS — Z87891 Personal history of nicotine dependence: Secondary | ICD-10-CM

## 2022-05-20 DIAGNOSIS — F17210 Nicotine dependence, cigarettes, uncomplicated: Secondary | ICD-10-CM | POA: Diagnosis not present

## 2022-05-20 NOTE — Telephone Encounter (Signed)
Patient is needing a letter to excuse him from jury duty . He needs by 6/28 but you have no available slots this month and need by 6/28. Can we put him in with [place] next week if she has openings. Have copy of letter in your folder.

## 2022-05-20 NOTE — Progress Notes (Signed)
Tony Doyle  Visit Date: 05/20/2022 Visit Type: In-Person at [facility]  LUNG CANCER SCREENING SHARED DECISION-MAKING VISIT - Age: 51 y.o.  - Pack year smoking history: 37.5 pack-years  - Type of tobacco abuse: Cigarettes - Current smoker or < 15 years of cessation: Current, 1 PPD cigarettes - No current symptoms of lung cancer:  Patient denies any hemoptysis, unintentional weight loss, and unexplained cough - Risks and benefits of lung cancer screening discussed: Negative: Over-diagnosis, radiation exposure, false positives, and additional testing Positive: Discover early stage lung cancer resulting in higher incidence of cure - Patient educated regarding the importance of adherence to continued lung cancer screening. - Currently, there are no co-morbidities to prevent treatment to therapy for lung cancer and the patient is agreeable to pursue treatment if a malignancy is discovered.  [place] Preventative Services Task Force recommend annual screening for lung cancer with low-dose CT in adults aged 44 - 80 years who have a 20+ pack year smoking history and currently smoke or have quit smoking within the past 15 years.  Screening should be discontinued once a person has not smoked for 15 years or develops a health problem that substantially limits life expectancy or the ability or willingness to have curative lung surgery.  It is a category B recommendation.  Similar stances are provided by CMS, NCCN, and AATS.  Social History   Tobacco Use   Smoking status: Every Day    Packs/day: 1.00    Years: 33.00    Total pack years: 33.00    Types: Cigarettes    Start date: 58   Smokeless tobacco: Never  Vaping Use   Vaping Use: Never used  Substance Use Topics   Alcohol use: No   Drug use: No     Personal history of tobacco use presenting hazards to health: - This patient meets criteria for low-dose CT lung cancer screening  - The shared decision making visit discussion  included risks and benefits of screening, potential for follow-up, diagnostic testing for abnormal scans, potential for false positive tests, overdiagnosis, discussion about total radiation exposure - Patient stated willingness to undergo diagnostics and treatment as needed - Patient was counseled on smoking cessation to decrease the  risk of lung cancer, pulmonary disease, heart disease, and stroke - Patient has been referred to Lung Cancer Screening Nurse Coordinator for further scheduling of LDCT and for further resources regarding free nicotine replacement therapy and information about smoking cessation classes - Counseling on the importance of adherence to annual lung cancer LDCT screening, impact of co-morbidities, and ability or willingness to undergo diagnosis and treatment is imperative for compliance of the program. - Counseling on the importance of continued smoking cessation for former smokers; the importance of smoking cessation for current smokers, and information about tobacco cessation interventions have been given to patient including [place] Quit [facility] and 1-800-quit [place] programs.  Yearly follow up will be coordinated by Kennith Gain, RN, MSN [facility] Oncology Nurse Navigator.)  Carnella Guadalajara, PA-C  05/20/2022 12:32 PM

## 2022-05-20 NOTE — Patient Instructions (Signed)
You were seen today for your shared decision making visit and a low-dose CT scan for lung cancer screening.    ° °Thank you for your participation in this life-saving program! °

## 2022-05-21 NOTE — Telephone Encounter (Signed)
Patient informed of md message. Verbalized understanding. Appt made for 05/30/23 at 8:40a. **JUST FYI**

## 2022-05-21 NOTE — Telephone Encounter (Signed)
It would be fine to put him with Hillary Bow, To document why he cannot sit for the period of time that has been asked, Tony Doyle can always consult with me

## 2022-05-22 NOTE — Telephone Encounter (Signed)
Patient has appointment coming up withLeonna

## 2022-05-29 ENCOUNTER — Encounter: Payer: Self-pay | Admitting: Nurse Practitioner

## 2022-05-29 ENCOUNTER — Ambulatory Visit (INDEPENDENT_AMBULATORY_CARE_PROVIDER_SITE_OTHER): Payer: No Typology Code available for payment source | Admitting: Nurse Practitioner

## 2022-05-29 VITALS — BP 110/58 | HR 73 | Temp 98.2°F | Wt 215.8 lb

## 2022-05-29 DIAGNOSIS — M79605 Pain in left leg: Secondary | ICD-10-CM | POA: Diagnosis not present
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
## 2022-06-02 ENCOUNTER — Ambulatory Visit (HOSPITAL_COMMUNITY)
Admission: RE | Admit: 2022-06-02 | Discharge: 2022-06-02 | Disposition: A | Discharge status: Home / Self Care | Payer: No Typology Code available for payment source | Source: Ambulatory Visit | Attending: Nurse Practitioner | Admitting: Nurse Practitioner

## 2022-06-02 DIAGNOSIS — M79605 Pain in left leg: Secondary | ICD-10-CM | POA: Diagnosis not present

## 2022-06-05 ENCOUNTER — Other Ambulatory Visit: Payer: Self-pay | Admitting: Allergy & Immunology

## 2022-07-31 ENCOUNTER — Ambulatory Visit
Admission: RE | Admit: 2022-07-31 | Discharge: 2022-07-31 | Disposition: A | Discharge status: Home / Self Care | Payer: No Typology Code available for payment source | Source: Ambulatory Visit | Attending: Nurse Practitioner | Admitting: Nurse Practitioner

## 2022-07-31 VITALS — BP 106/61 | HR 68 | Temp 97.7°F | Resp 18

## 2022-07-31 DIAGNOSIS — H109 Unspecified conjunctivitis: Secondary | ICD-10-CM

## 2022-07-31 MED ORDER — POLYMYXIN B-TRIMETHOPRIM 10000-0.1 UNIT/ML-% OP SOLN: 1.0000 [drp] | Freq: Four times a day (QID) | OPHTHALMIC | 0 refills | Status: AC

## 2022-07-31 NOTE — ED Triage Notes (Signed)
Pt reports drainage and redness in left eye x 1 day, after some grass got into his eye.

## 2022-07-31 NOTE — ED Provider Notes (Signed)
[facility]    CSN: [id number] Arrival date & time: 07/31/22  1142      History   Chief Complaint Chief Complaint  Patient presents with   Appointment    1200   Eye Problem    HPI Tony Doyle is a 51 y.o. male.   The history is provided by the patient.   Patient presents for complaints of left eye redness, drainage that started 1 day ago.  Patient states that he was in 1 day ago and thinks a piece of grass flew into his eye.  He does not feel like there is a foreign object in the eye at this time.  Patient states when he woke up this morning, the eye was matted shut and had "pus" in it.  He states that he has not had fever, chills, change in vision, loss of vision, decreased vision, but states that the eye was a little blurred before he cleaned the pus out of it this morning.  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  He states that he has not used any medication for his symptoms.  Patient informs that his daughter was just diagnosed with pinkeye.  Past Medical History:  Diagnosis Date   Asthma    COPD (chronic obstructive pulmonary disease) (HCC)    Hypercholesterolemia    Pneumonia     Patient Active Problem List   Diagnosis Date Noted   Viral upper respiratory tract infection 01/14/2021   Asthma with COPD (HCC) 09/12/2020   Non-allergic rhinitis 09/12/2020   Hyperlipidemia 12/13/2018   Chest pain, musculoskeletal 02/22/2014   Smoker 02/22/2014   Foot contusion 08/23/2012    Past Surgical History:  Procedure Laterality Date   arm surgery     LEFT   COLONOSCOPY WITH PROPOFOL N/A 10/12/2020   Procedure: COLONOSCOPY WITH PROPOFOL;  Surgeon: Lanelle Bal, DO;  [facility];  Service: Endoscopy;  Laterality: N/A;  9:30   LEG SURGERY     LEFT       Home Medications    Prior to Admission medications   Medication Sig Start Date End Date Taking? Authorizing Provider   trimethoprim-polymyxin b (POLYTRIM) ophthalmic solution Place 1 drop into both eyes every 6 (six) hours for 7 days. 07/31/22 08/07/22 Yes Shahiem Bedwell-Warren, Sadie Haber, NP  albuterol (PROVENTIL) (2.5 MG/3ML) 0.083% nebulizer solution Take 3 mLs (2.5 mg total) by nebulization at bedtime. 03/26/22   Alfonse Spruce, MD  albuterol (VENTOLIN HFA) 108 (90 Base) MCG/ACT inhaler TAKE 2 PUFFS BY MOUTH EVERY 6 HOURS AS NEEDED FOR WHEEZE OR SHORTNESS OF BREATH 06/05/22   Alfonse Spruce, MD  atorvastatin (LIPITOR) 20 MG tablet Take 1 tablet (20 mg total) by mouth daily. 04/21/22   Babs Sciara, MD  budesonide (PULMICORT) 0.5 MG/2ML nebulizer solution Take 2 mLs (0.5 mg total) by nebulization daily. 03/26/22   Alfonse Spruce, MD  cetirizine (ZYRTEC) 10 MG tablet TAKE 1 TABLET BY MOUTH EVERY DAY 03/25/21   Nehemiah Settle, FNP  fluticasone (FLONASE) 50 MCG/ACT nasal spray SPRAY 2 SPRAYS INTO EACH NOSTRIL EVERY DAY Patient taking differently: Place 2 sprays into both nostrils daily as needed for rhinitis. 05/01/20   Babs Sciara, MD  formoterol (PERFOROMIST) 20 MCG/2ML nebulizer solution Take 2 mLs (20 mcg total) by nebulization at bedtime. 03/26/22 04/25/22  Alfonse Spruce, MD    Family History Family History  Problem Relation Age of  Onset   Asthma Father    Emphysema Father        smoked   Asthma Mother    Emphysema Mother        smoked   Eczema Brother    Allergic rhinitis Neg Hx    Angioedema Neg Hx    Atopy Neg Hx    Immunodeficiency Neg Hx    Urticaria Neg Hx     Social History Social History   Tobacco Use   Smoking status: Every Day    Packs/day: 1.00    Years: 33.00    Total pack years: 33.00    Types: Cigarettes    Start date: 1988   Smokeless tobacco: Never  Vaping Use   Vaping Use: Never used  Substance Use Topics   Alcohol use: No   Drug use: No     Allergies   Wellbutrin [bupropion], Chantix [varenicline tartrate], and Penicillins   Review of  Systems Review of Systems Per HPI  Physical Exam Triage Vital Signs ED Triage Vitals  Enc Vitals Group     BP 07/31/22 1151 106/61     Pulse Rate 07/31/22 1151 68     Resp 07/31/22 1151 18     Temp 07/31/22 1151 97.7 F (36.5 C)     Temp Source 07/31/22 1151 Oral     SpO2 07/31/22 1151 96 %     Weight --      Height --      Head Circumference --      Peak Flow --      Pain Score 07/31/22 1150 0     Pain Loc --      Pain Edu? --      Excl. in [place]? --    No data found.  Updated Vital Signs BP 106/61 (BP Location: Right Arm)   Pulse 68   Temp 97.7 F (36.5 C) (Oral)   Resp 18   SpO2 96%   Visual Acuity Right Eye Distance:   Left Eye Distance:   Bilateral Distance:    Right Eye Near:   Left Eye Near:    Bilateral Near:     Physical Exam Vitals and nursing note reviewed.  Constitutional:      General: He is not in acute distress.    Appearance: Normal appearance. He is well-developed.  HENT:     Head: Normocephalic and atraumatic.  Eyes:     General: Lids are normal. Lids are everted, no foreign bodies appreciated. No visual field deficit.       Right eye: No discharge.        Left eye: Discharge (PURULENT DRAINAGE PRESENT) present.No foreign body or hordeolum.     Extraocular Movements: Extraocular movements intact.     Right eye: Normal extraocular motion and no nystagmus.     Left eye: Normal extraocular motion and no nystagmus.     Conjunctiva/sclera:     Right eye: Right conjunctiva is not injected.     Left eye: Left conjunctiva is injected. No chemosis.    Pupils: Pupils are equal, round, and reactive to light.  Cardiovascular:     Rate and Rhythm: Normal rate and regular rhythm.     Heart sounds: No murmur heard. Pulmonary:     Effort: Pulmonary effort is normal. No respiratory distress.     Breath sounds: Normal breath sounds.  Abdominal:     Palpations: Abdomen is soft.     Tenderness: There is no abdominal tenderness.  Musculoskeletal:  General: No swelling.     Cervical back: Neck supple.  Skin:    General: Skin is warm and dry.     Capillary Refill: Capillary refill takes less than 2 seconds.  Neurological:     Mental Status: He is alert.  Psychiatric:        Mood and Affect: Mood normal.      UC Treatments / Results  Labs (all labs ordered are listed, but only abnormal results are displayed) Labs Reviewed - No data to display  EKG   Radiology No results found.  Procedures Procedures (including critical care time)  Medications Ordered in UC Medications - No data to display  Initial Impression / Assessment and Plan / UC Course  I have reviewed the triage vital signs and the nursing notes.  Pertinent labs & imaging results that were available during my care of the patient were reviewed by me and considered in my medical decision making (see chart for details).  Patient presents for symptoms of the left thigh that started 1 day ago.  On exam, patient has injection of the conjunctiva of the left eye.  There is also purulent drainage seen on exam.  Fluorescein stain was deferred at this time as it appears symptoms are more consistent with bacterial conjunctivitis versus corneal abrasion.  Plan of care would not change either way.  Patient was prescribed Polytrim eyedrops for his symptoms.  Supportive care recommendations were provided to the patient.  Patient was advised to perform strict hand hygiene while symptoms persist.  Patient advised to follow-up if symptoms fail to improve. Final Clinical Impressions(s) / UC Diagnoses   Final diagnoses:  Bacterial conjunctivitis of left eye     Discharge Instructions      Use eyedrops as prescribed.  Recommend using them in both eyes in the event that the infection spreads. Cool compresses to the eyes to help with pain or swelling. May use over-the-counter eyedrops such as Visine or Clear Eyes to keep the eyes moist and decreased redness. Wear protective  eyewear when performing landscaping duties. Strict handwashing when applying medication.  Avoid rubbing or  Follow-up if symptoms do not improve.      ED Prescriptions     Medication Sig Dispense Auth. Provider   trimethoprim-polymyxin b (POLYTRIM) ophthalmic solution Place 1 drop into both eyes every 6 (six) hours for 7 days. 10 mL Tony Doyle, Sadie Haber, NP      PDMP not reviewed this encounter.   Abran Cantor, NP 07/31/22 1208

## 2022-07-31 NOTE — Discharge Instructions (Signed)
Use eyedrops as prescribed.  Recommend using them in both eyes in the event that the infection spreads. Cool compresses to the eyes to help with pain or swelling. May use over-the-counter eyedrops such as Visine or Clear Eyes to keep the eyes moist and decreased redness. 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 Strict handwashing when applying medication.  Avoid rubbing or  Follow-up if symptoms do not improve.

## 2022-08-17 ENCOUNTER — Other Ambulatory Visit: Payer: Self-pay | Admitting: Allergy & Immunology

## 2022-09-14 ENCOUNTER — Encounter: Payer: Self-pay | Admitting: Emergency Medicine

## 2022-09-14 ENCOUNTER — Ambulatory Visit
Admission: EM | Admit: 2022-09-14 | Discharge: 2022-09-14 | Disposition: A | Discharge status: Home / Self Care | Payer: No Typology Code available for payment source | Attending: Family Medicine | Admitting: Family Medicine

## 2022-09-14 ENCOUNTER — Other Ambulatory Visit: Payer: Self-pay

## 2022-09-14 DIAGNOSIS — T300 Burn of unspecified body region, unspecified degree: Secondary | ICD-10-CM

## 2022-09-14 DIAGNOSIS — T22211A Burn of second degree of right forearm, initial encounter: Secondary | ICD-10-CM

## 2022-09-14 MED ORDER — SILVER SULFADIAZINE 1 % EX CREA: 1.0000 | TOPICAL_CREAM | Freq: Every day | CUTANEOUS | 0 refills | Status: DC

## 2022-09-14 MED ORDER — KETOROLAC TROMETHAMINE 30 MG/ML IJ SOLN
30.0000 mg | Freq: Once | INTRAMUSCULAR | Status: AC
  Administered 2022-09-14: 30 mg via INTRAMUSCULAR

## 2022-09-14 NOTE — Discharge Instructions (Signed)
The Silvadene cream at least once daily and cover the area with nonstick gauze pads, wrap in Coban wrap.  Keep the area clean with mild soap and water prior to dressing changes.  Elevate the arm at rest to help avoid swelling.  Follow-up right away if the area is becoming significantly red, draining thick pus or any other concerning changes

## 2022-09-14 NOTE — ED Notes (Signed)
Cold pack applied.
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
## 2022-09-14 NOTE — ED Notes (Signed)
Silvadene cream applied to site. Nonadherent pad applied on top of cream. Secured with ace wrap. Pt tolerated well.  Site management and infection prevention education provided. Pt tolerated well.

## 2022-09-17 ENCOUNTER — Other Ambulatory Visit: Payer: Self-pay | Admitting: Allergy & Immunology

## 2022-09-24 DIAGNOSIS — E663 Overweight: Secondary | ICD-10-CM | POA: Diagnosis not present

## 2022-09-24 DIAGNOSIS — Z008 Encounter for other general examination: Secondary | ICD-10-CM | POA: Diagnosis not present

## 2022-09-24 DIAGNOSIS — Z6829 Body mass index (BMI) 29.0-29.9, adult: Secondary | ICD-10-CM | POA: Diagnosis not present

## 2022-09-24 DIAGNOSIS — R7303 Prediabetes: Secondary | ICD-10-CM | POA: Diagnosis not present

## 2022-09-24 DIAGNOSIS — I700 Atherosclerosis of aorta: Secondary | ICD-10-CM | POA: Diagnosis not present

## 2022-09-24 DIAGNOSIS — E785 Hyperlipidemia, unspecified: Secondary | ICD-10-CM | POA: Diagnosis not present

## 2022-09-24 DIAGNOSIS — J449 Chronic obstructive pulmonary disease, unspecified: Secondary | ICD-10-CM | POA: Diagnosis not present

## 2022-09-24 DIAGNOSIS — J45909 Unspecified asthma, uncomplicated: Secondary | ICD-10-CM | POA: Diagnosis not present

## 2022-09-24 DIAGNOSIS — F17210 Nicotine dependence, cigarettes, uncomplicated: Secondary | ICD-10-CM | POA: Diagnosis not present

## 2022-10-16 ENCOUNTER — Other Ambulatory Visit: Payer: Self-pay | Admitting: Family Medicine

## 2022-10-16 DIAGNOSIS — E7849 Other hyperlipidemia: Secondary | ICD-10-CM

## 2022-10-22 ENCOUNTER — Encounter: Payer: Self-pay | Admitting: Family Medicine

## 2022-10-22 ENCOUNTER — Ambulatory Visit (INDEPENDENT_AMBULATORY_CARE_PROVIDER_SITE_OTHER): Payer: No Typology Code available for payment source | Admitting: Family Medicine

## 2022-10-22 VITALS — BP 116/62 | HR 64 | Ht 68.0 in | Wt 198.0 lb

## 2022-10-22 DIAGNOSIS — Z125 Encounter for screening for malignant neoplasm of prostate: Secondary | ICD-10-CM | POA: Diagnosis not present

## 2022-10-22 DIAGNOSIS — E7849 Other hyperlipidemia: Secondary | ICD-10-CM | POA: Diagnosis not present

## 2022-10-22 DIAGNOSIS — R7309 Other abnormal glucose: Secondary | ICD-10-CM | POA: Diagnosis not present

## 2022-10-22 NOTE — Progress Notes (Signed)
   Subjective:    Patient ID: JAICEON COLLISTER, male    DOB: 1971/07/30, 51 y.o.   MRN: [id number]  HPI 6 month follow up hyperlipidemia  Patient relates that he is trying to good job with eating healthy He has lost some weight He states he is breathing is better Able to do more Denies any chest tightness pressure pain shortness of breath Takes his cholesterol medicine regular basis Has a history of hyperglycemia trying to minimize sugars and starches Review of Systems     Objective:   Physical Exam  General-in no acute distress Eyes-no discharge Lungs-respiratory rate normal, CTA CV-no murmurs,RRR Extremities skin warm dry no edema Neuro grossly normal Behavior normal, alert       Assessment & Plan:   1. Other hyperlipidemia Lipid profile very good continue current medications check labs - PSA - Lipid Panel - Basic Metabolic Panel - Hemoglobin A1c  2. Elevated hemoglobin A1c Glucose slightly elevated check labs make sure he is not becoming diabetic - Hemoglobin A1c  3. Screening PSA (prostate specific antigen) Screening PSA ordered - PSA Patient counseled to quit smoking  Follow-up in 5 to 6 months

## 2022-10-28 DIAGNOSIS — E7849 Other hyperlipidemia: Secondary | ICD-10-CM | POA: Diagnosis not present

## 2022-10-28 DIAGNOSIS — R7309 Other abnormal glucose: Secondary | ICD-10-CM | POA: Diagnosis not present

## 2022-10-28 DIAGNOSIS — Z125 Encounter for screening for malignant neoplasm of prostate: Secondary | ICD-10-CM | POA: Diagnosis not present

## 2022-10-29 LAB — BASIC METABOLIC PANEL
BUN/Creatinine Ratio: 13 (ref 9–20)
BUN: 13 mg/dL (ref 6–24)
CO2: 23 mmol/L (ref 20–29)
Calcium: 9.6 mg/dL (ref 8.7–10.2)
Chloride: 102 mmol/L (ref 96–106)
Creatinine, Ser: 0.98 mg/dL (ref 0.76–1.27)
Glucose: 95 mg/dL (ref 70–99)
Potassium: 5 mmol/L (ref 3.5–5.2)
Sodium: 137 mmol/L (ref 134–144)
eGFR: 92 mL/min/{1.73_m2} (ref >59)

## 2022-10-29 LAB — LIPID PANEL
Chol/HDL Ratio: 2.9 ratio (ref 0.0–5.0)
Cholesterol, Total: 132 mg/dL (ref 100–199)
HDL: 46 mg/dL (ref >39)
LDL Chol Calc (NIH): 74 mg/dL (ref 0–99)
Triglycerides: 58 mg/dL (ref 0–149)
VLDL Cholesterol Cal: 12 mg/dL (ref 5–40)

## 2022-10-29 LAB — HEMOGLOBIN A1C
Est. average glucose Bld gHb Est-mCnc: 105 mg/dL
Hgb A1c MFr Bld: 5.3 % (ref 4.8–5.6)

## 2022-10-29 LAB — PSA: Prostate Specific Ag, Serum: 0.3 ng/mL (ref 0.0–4.0)

## 2022-11-28 ENCOUNTER — Ambulatory Visit
Admission: EM | Admit: 2022-11-28 | Discharge: 2022-11-28 | Disposition: A | Discharge status: Home / Self Care | Payer: No Typology Code available for payment source | Attending: Nurse Practitioner | Admitting: Nurse Practitioner

## 2022-11-28 ENCOUNTER — Ambulatory Visit (INDEPENDENT_AMBULATORY_CARE_PROVIDER_SITE_OTHER): Payer: No Typology Code available for payment source

## 2022-11-28 DIAGNOSIS — R531 Weakness: Secondary | ICD-10-CM | POA: Diagnosis not present

## 2022-11-28 DIAGNOSIS — J441 Chronic obstructive pulmonary disease with (acute) exacerbation: Secondary | ICD-10-CM | POA: Diagnosis not present

## 2022-11-28 DIAGNOSIS — Z1152 Encounter for screening for COVID-19: Secondary | ICD-10-CM | POA: Insufficient documentation

## 2022-11-28 DIAGNOSIS — R059 Cough, unspecified: Secondary | ICD-10-CM

## 2022-11-28 MED ORDER — PREDNISONE 20 MG PO TABS: 40.0000 mg | ORAL_TABLET | Freq: Every day | ORAL | 0 refills | Status: AC

## 2022-11-28 MED ORDER — ALBUTEROL SULFATE (2.5 MG/3ML) 0.083% IN NEBU
2.5000 mg | INHALATION_SOLUTION | Freq: Once | RESPIRATORY_TRACT | Status: AC
  Administered 2022-11-28: 2.5 mg via RESPIRATORY_TRACT

## 2022-11-28 NOTE — ED Provider Notes (Signed)
[facility]    CSN: [id number] Arrival date & time: 11/28/22  1213      History   Chief Complaint Chief Complaint  Patient presents with   Fatigue   Cough    HPI [name] is a 51 y.o. male.   Patient presents today for 2-day history of bodyaches, chills, dry cough, chest pain after coughing, chest and nasal congestion, runny nose, headache, nausea without vomiting, decreased appetite, and fatigue.  He denies shortness of breath or wheezing, sore throat, ear pain, abdominal pain, diarrhea, and loss of taste or smell.  No known sick contacts.  Has been taking Tylenol Cold and Mucinex for symptoms without much benefit.  Patient reports he has a history of asthma with COPD.  Has been using albuterol inhaler which helps temporarily.    Past Medical History:  Diagnosis Date   Asthma    COPD (chronic obstructive pulmonary disease) (HCC)    Hypercholesterolemia    Pneumonia     Patient Active Problem List   Diagnosis Date Noted   Viral upper respiratory tract infection 01/14/2021   Asthma with COPD 09/12/2020   Non-allergic rhinitis 09/12/2020   Hyperlipidemia 12/13/2018   Chest pain, musculoskeletal 02/22/2014   Smoker 02/22/2014   Foot contusion 08/23/2012    Past Surgical History:  Procedure Laterality Date   arm surgery     LEFT   COLONOSCOPY WITH PROPOFOL N/A 10/12/2020   Procedure: COLONOSCOPY WITH PROPOFOL;  Surgeon: Lanelle Bal, DO;  Location: [facility];  Service: Endoscopy;  Laterality: N/A;  9:30   LEG SURGERY     LEFT       Home Medications    Prior to Admission medications   Medication Sig Start Date End Date Taking? Authorizing Provider  predniSONE (DELTASONE) 20 MG tablet Take 2 tablets (40 mg total) by mouth daily with breakfast for 5 days. 11/28/22 12/03/22 Yes Cathlean Marseilles A, NP  albuterol (PROVENTIL) (2.5 MG/3ML) 0.083% nebulizer solution Take 3 mLs (2.5 mg total) by nebulization at bedtime. 03/26/22   Alfonse Spruce, MD  albuterol (VENTOLIN HFA) 108 (90 Base) MCG/ACT inhaler TAKE 2 PUFFS BY MOUTH EVERY 6 HOURS AS NEEDED FOR WHEEZE OR SHORTNESS OF BREATH 08/18/22   Alfonse Spruce, MD  atorvastatin (LIPITOR) 20 MG tablet TAKE 1 TABLET BY MOUTH EVERY DAY 10/16/22   Luking, Scott A, MD  budesonide (PULMICORT) 0.5 MG/2ML nebulizer solution TAKE 2 MLS (0.5 MG TOTAL) BY NEBULIZATION DAILY. 09/17/22   Alfonse Spruce, MD  cetirizine (ZYRTEC) 10 MG tablet TAKE 1 TABLET BY MOUTH EVERY DAY 03/25/21   Nehemiah Settle, FNP  fluticasone (FLONASE) 50 MCG/ACT nasal spray SPRAY 2 SPRAYS INTO EACH NOSTRIL EVERY DAY Patient taking differently: Place 2 sprays into both nostrils daily as needed for rhinitis. 05/01/20   Babs Sciara, MD  formoterol (PERFOROMIST) 20 MCG/2ML nebulizer solution Take 2 mLs (20 mcg total) by nebulization at bedtime. 03/26/22 10/22/22  Alfonse Spruce, MD    Family History Family History  Problem Relation Age of Onset   Asthma Father    Emphysema Father        smoked   Asthma Mother    Emphysema Mother        smoked   Eczema Brother    Allergic rhinitis Neg Hx    Angioedema Neg Hx    Atopy Neg Hx    Immunodeficiency Neg Hx    Urticaria Neg Hx     Social History  Social History   Tobacco Use   Smoking status: Every Day    Packs/day: 0.50    Years: 33.00    Total pack years: 16.50    Types: Cigarettes    Start date: 1988   Smokeless tobacco: Never  Vaping Use   Vaping Use: Never used  Substance Use Topics   Alcohol use: No   Drug use: No     Allergies   Wellbutrin [bupropion], Chantix [varenicline tartrate], and Penicillins   Review of Systems Review of Systems Per HPI  Physical Exam Triage Vital Signs ED Triage Vitals  Enc Vitals Group     BP 11/28/22 1519 118/81     Pulse Rate 11/28/22 1519 76     Resp 11/28/22 1519 (!) 24     Temp 11/28/22 1519 99 F (37.2 C)     Temp Source 11/28/22 1519 Oral     SpO2 11/28/22 1519 94 %      Weight --      Height --      Head Circumference --      Peak Flow --      Pain Score 11/28/22 1518 6     Pain Loc --      Pain Edu? --      Excl. in GC? --    No data found.  Updated Vital Signs BP 118/81 (BP Location: Right Arm)   Pulse 76   Temp 99 F (37.2 C) (Oral)   Resp (!) 24   SpO2 94%   Respiratory rate after albuterol nebulizer: 20  Visual Acuity Right Eye Distance:   Left Eye Distance:   Bilateral Distance:    Right Eye Near:   Left Eye Near:    Bilateral Near:     Physical Exam Vitals and nursing note reviewed.  Constitutional:      General: He is not in acute distress.    Appearance: Normal appearance. He is not ill-appearing or toxic-appearing.  HENT:     Head: Normocephalic and atraumatic.     Right Ear: Tympanic membrane, ear canal and external ear normal.     Left Ear: Tympanic membrane, ear canal and external ear normal.     Nose: Congestion and rhinorrhea present.     Mouth/Throat:     Mouth: Mucous membranes are moist.     Pharynx: Oropharynx is clear. No oropharyngeal exudate or posterior oropharyngeal erythema.  Eyes:     General: No scleral icterus.    Extraocular Movements: Extraocular movements intact.  Cardiovascular:     Rate and Rhythm: Normal rate and regular rhythm.  Pulmonary:     Effort: Pulmonary effort is normal. No respiratory distress.     Breath sounds: Normal breath sounds. Decreased air movement present. No wheezing, rhonchi or rales.  Abdominal:     General: Abdomen is flat. Bowel sounds are normal. There is no distension.     Palpations: Abdomen is soft.  Musculoskeletal:     Cervical back: Normal range of motion and neck supple.  Lymphadenopathy:     Cervical: No cervical adenopathy.  Skin:    General: Skin is warm and dry.     Coloration: Skin is not jaundiced or pale.     Findings: No erythema or rash.  Neurological:     Mental Status: He is alert and oriented to person, place, and time.  Psychiatric:         Behavior: Behavior is cooperative.      UC Treatments / Results  Labs (all labs ordered are listed, but only abnormal results are displayed) Labs Reviewed  SARS CORONAVIRUS 2 (TAT 6-24 HRS)    EKG   Radiology DG Chest 2 View  Result Date: 11/28/2022 CLINICAL DATA:  Cough and congestion.  Weakness. EXAM: CHEST - 2 VIEW COMPARISON:  Chest two views 09/30/2021 FINDINGS: Cardiac silhouette and mediastinal contours are within normal limits. Mild chronic interstitial thickening is unchanged from prior. No new acute airspace opacity. No pleural effusion or thorax. Multilevel degenerative disc changes of the thoracic spine. IMPRESSION: No active cardiopulmonary disease. Electronically Signed   By: Neita Garnet M.D.   On: 11/28/2022 15:47    Procedures Procedures (including critical care time)  Medications Ordered in UC Medications  albuterol (PROVENTIL) (2.5 MG/3ML) 0.083% nebulizer solution 2.5 mg (2.5 mg Nebulization Given 11/28/22 1542)    Initial Impression / Assessment and Plan / UC Course  I have reviewed the triage vital signs and the nursing notes.  Pertinent labs & imaging results that were available during my care of the patient were reviewed by me and considered in my medical decision making (see chart for details).   Patient is well-appearing, normotensive, afebrile, not tachycardic, oxygenating well on room air.  He is slightly tachypneic in triage, however this improved after albuterol nebulizer.  COPD exacerbation (HCC) Encounter for screening for COVID-19 Suspect viral etiology Unable to perform influenza testing secondary to national backorder of testing supplies COVID-19 testing pending Supportive care discussed with patient Recommended use of albuterol inhaler every 4-6 hours as needed at home Start oral prednisone starting tomorrow ER and return precautions discussed  The patient was given the opportunity to ask questions.  All questions answered to their  satisfaction.  The patient is in agreement to this plan.    Final Clinical Impressions(s) / UC Diagnoses   Final diagnoses:  COPD exacerbation (HCC)  Encounter for screening for COVID-19     Discharge Instructions      I suspect you are having an exacerbation of your breathing secondary to a viral upper respiratory infection.  We have tested you for COVID-19 and will call you with positive results.  In the meantime, continue the breathing treatments every 4-6 hours as needed for wheezing or shortness of breath.  You can also start the prednisone 40 mg daily for 5 days starting tomorrow morning.  Seek care if your symptoms persist or worsen despite treatment     ED Prescriptions     Medication Sig Dispense Auth. Provider   predniSONE (DELTASONE) 20 MG tablet Take 2 tablets (40 mg total) by mouth daily with breakfast for 5 days. 10 tablet Valentino Nose, NP      PDMP not reviewed this encounter.   Valentino Nose, NP 11/28/22 [phone]

## 2022-11-28 NOTE — ED Triage Notes (Signed)
Pt reports nasal congestion, chest congestion, headache, fatigue, loss of appetite and cough x 2-3 days. Reports he was not able to stand up from bed.  Mucinex and Tylenol Cold gives some relief.

## 2022-11-28 NOTE — Discharge Instructions (Addendum)
I suspect you are having an exacerbation of your breathing secondary to a viral upper respiratory infection.  We have tested you for COVID-19 and will call you with positive results.  In the meantime, continue the breathing treatments every 4-6 hours as needed for wheezing or shortness of breath.  You can also start the prednisone 40 mg daily for 5 days starting tomorrow morning.  Seek care if your symptoms persist or worsen despite treatment

## 2022-11-29 LAB — SARS CORONAVIRUS 2 (TAT 6-24 HRS): SARS Coronavirus 2: NEGATIVE

## 2022-12-30 ENCOUNTER — Encounter: Payer: Self-pay | Admitting: Family Medicine

## 2022-12-31 ENCOUNTER — Ambulatory Visit: Payer: No Typology Code available for payment source | Admitting: Family Medicine

## 2022-12-31 ENCOUNTER — Encounter: Payer: Self-pay | Admitting: Family Medicine

## 2022-12-31 ENCOUNTER — Telehealth: Payer: Self-pay

## 2022-12-31 VITALS — BP 119/72 | HR 63 | Temp 98.3°F | Ht 68.0 in | Wt 204.0 lb

## 2022-12-31 DIAGNOSIS — U071 COVID-19: Secondary | ICD-10-CM | POA: Diagnosis not present

## 2022-12-31 MED ORDER — NIRMATRELVIR/RITONAVIR (PAXLOVID)TABLET: 3.0000 | ORAL_TABLET | Freq: Two times a day (BID) | ORAL | 0 refills | Status: AC

## 2022-12-31 NOTE — Telephone Encounter (Signed)
Called [employer] and got discount information number tor patient to call [phone]. Patient would like to know what he could do in the mean time to treat his symptoms.

## 2022-12-31 NOTE — Telephone Encounter (Signed)
Recommend office visit this afternoon 

## 2022-12-31 NOTE — Telephone Encounter (Signed)
Appointment scheduled for today 

## 2022-12-31 NOTE — Telephone Encounter (Signed)
Patient called and stated he tested COVID +. Today he is having nasal congestion, denies SOB, wheezed one time and his inhaler relived this. Patient states he is having body aches and fever. Patient would like a prescription of Paxlovid if possible.

## 2022-12-31 NOTE — Progress Notes (Signed)
   Subjective:    Patient ID: Tony Doyle, male    DOB: 11-06-1971, 52 y.o.   MRN: [id number]  HPI Nasal congestion for over a week and runny nose -home test covid positive No fever or cough   Couple days of head congestion drainage coughing sinus pressure felt somewhat rundown with some chills yesterday Review of Systems     Objective:   Physical Exam  Gen-NAD not toxic TMS-normal bilateral T- normal no redness Chest-CTA respiratory rate normal no crackles CV RRR no murmur Skin-warm dry Neuro-grossly normal No sign of pneumonia      Assessment & Plan:  Viral-like illness COVID previous GFR good Paxlovid prescribed Warning signs discussed

## 2023-02-11 ENCOUNTER — Other Ambulatory Visit: Payer: Self-pay | Admitting: Allergy & Immunology

## 2023-03-13 ENCOUNTER — Other Ambulatory Visit: Payer: Self-pay | Admitting: Allergy & Immunology

## 2023-03-24 ENCOUNTER — Encounter: Payer: No Typology Code available for payment source | Admitting: Family Medicine

## 2023-03-26 NOTE — Patient Instructions (Addendum)
[name] , Thank you for taking time to come for your Medicare Wellness Visit. I appreciate your ongoing commitment to your health goals. Please review the following plan we discussed and let me know if I can assist you in the future.   These are the goals we discussed:  Goals      Exercise 3x per week (30 min per time)     Continue to exercise and stay healthy.        This is a list of the screening recommended for you and due dates:  Health Maintenance  Topic Date Due   Zoster (Shingles) Vaccine (1 of 2) Never done   COVID-19 Vaccine (4 - 2023-24 season) 08/08/2022   Medicare Annual Wellness Visit  03/19/2023   Flu Shot  07/09/2023   DTaP/Tdap/Td vaccine (3 - Td or Tdap) 09/12/2028   Colon Cancer Screening  10/12/2030   Hepatitis C Screening: USPSTF Recommendation to screen - Ages 18-79 yo.  Completed   HIV Screening  Completed   HPV Vaccine  Aged Out   Screening for Lung Cancer  Discontinued    Advanced directives: Advance directive discussed with you today. I have provided a copy for you to complete at home and have notarized. Once this is complete please bring a copy in to our office so we can scan it into your chart.   Conditions/risks identified: Aim for 30 minutes of exercise or brisk walking, 6-8 glasses of water, and 5 servings of fruits and vegetables each day.  Next appointment: Follow up in one year for your annual wellness visit   Preventive Care 40-64 Years, Male Preventive care refers to lifestyle choices and visits with your health care provider that can promote health and wellness. What does preventive care include? A yearly physical exam. This is also called an annual well check. Dental exams once or twice a year. Routine eye exams. Ask your health care provider how often you should have your eyes checked. Personal lifestyle choices, including: Daily care of your teeth and gums. Regular physical activity. Eating a healthy diet. Avoiding tobacco and  drug use. Limiting alcohol use. Practicing safe sex. Taking low-dose aspirin every day starting at age 58. What happens during an annual well check? The services and screenings done by your health care provider during your annual well check will depend on your age, overall health, lifestyle risk factors, and family history of disease. Counseling  Your health care provider may ask you questions about your: Alcohol use. Tobacco use. Drug use. Emotional well-being. Home and relationship well-being. Sexual activity. Eating habits. Work and work Astronomer. Screening  You may have the following tests or measurements: Height, weight, and BMI. Blood pressure. Lipid and cholesterol levels. These may be checked every 5 years, or more frequently if you are over 32 years old. Skin check. Lung cancer screening. You may have this screening every year starting at age 25 if you have a 30-pack-year history of smoking and currently smoke or have quit within the past 15 years. Fecal occult blood test (FOBT) of the stool. You may have this test every year starting at age 12. Flexible sigmoidoscopy or colonoscopy. You may have a sigmoidoscopy every 5 years or a colonoscopy every 10 years starting at age 59. Prostate cancer screening. Recommendations will vary depending on your family history and other risks. Hepatitis C blood test. Hepatitis B blood test. Sexually transmitted disease (STD) testing. Diabetes screening. This is done by checking your blood sugar (glucose) after you  have not eaten for a while (fasting). You may have this done every 1-3 years. Discuss your test results, treatment options, and if necessary, the need for more tests with your health care provider. Vaccines  Your health care provider may recommend certain vaccines, such as: Influenza vaccine. This is recommended every year. Tetanus, diphtheria, and acellular pertussis (Tdap, Td) vaccine. You may need a Td booster every 10  years. Zoster vaccine. You may need this after age 59. Pneumococcal 13-valent conjugate (PCV13) vaccine. You may need this if you have certain conditions and have not been vaccinated. Pneumococcal polysaccharide (PPSV23) vaccine. You may need one or two doses if you smoke cigarettes or if you have certain conditions. Talk to your health care provider about which screenings and vaccines you need and how often you need them. This information is not intended to replace advice given to you by your health care provider. Make sure you discuss any questions you have with your health care provider. Document Released: 12/21/2015 Document Revised: 08/13/2016 Document Reviewed: 09/25/2015 Elsevier Interactive Patient Education  2017 [employer].  Fall Prevention in the Home Falls can cause injuries. They can happen to people of all ages. There are many things you can do to make your home safe and to help prevent falls. What can I do on the outside of my home? Regularly fix the edges of walkways and driveways and fix any cracks. Remove anything that might make you trip as you walk through a door, such as a raised step or threshold. Trim any bushes or trees on the path to your home. Use bright outdoor lighting. Clear any walking paths of anything that might make someone trip, such as rocks or tools. Regularly check to see if handrails are loose or broken. Make sure that both sides of any steps have handrails. Any raised decks and porches should have guardrails on the edges. Have any leaves, snow, or ice cleared regularly. Use sand or salt on walking paths during winter. Clean up any spills in your garage right away. This includes oil or grease spills. What can I do in the bathroom? Use night lights. Install grab bars by the toilet and in the tub and shower. Do not use towel bars as grab bars. Use non-skid mats or decals in the tub or shower. If you need to sit down in the shower, use a plastic,  non-slip stool. Keep the floor dry. Clean up any water that spills on the floor as soon as it happens. Remove soap buildup in the tub or shower regularly. Attach bath mats securely with double-sided non-slip rug tape. Do not have throw rugs and other things on the floor that can make you trip. What can I do in the bedroom? Use night lights. Make sure that you have a light by your bed that is easy to reach. Do not use any sheets or blankets that are too big for your bed. They should not hang down onto the floor. Have a firm chair that has side arms. You can use this for support while you get dressed. Do not have throw rugs and other things on the floor that can make you trip. What can I do in the kitchen? Clean up any spills right away. Avoid walking on wet floors. Keep items that you use a lot in easy-to-reach places. If you need to reach something above you, use a strong step stool that has a grab bar. Keep electrical cords out of the way. Do not use  floor polish or wax that makes floors slippery. If you must use wax, use non-skid floor wax. Do not have throw rugs and other things on the floor that can make you trip. What can I do with my stairs? Do not leave any items on the stairs. Make sure that there are handrails on both sides of the stairs and use them. Fix handrails that are broken or loose. Make sure that handrails are as long as the stairways. Check any carpeting to make sure that it is firmly attached to the stairs. Fix any carpet that is loose or worn. Avoid having throw rugs at the top or bottom of the stairs. If you do have throw rugs, attach them to the floor with carpet tape. Make sure that you have a light switch at the top of the stairs and the bottom of the stairs. If you do not have them, ask someone to add them for you. What else can I do to help prevent falls? Wear shoes that: Do not have high heels. Have rubber bottoms. Are comfortable and fit you well. Are closed  at the toe. Do not wear sandals. If you use a stepladder: Make sure that it is fully opened. Do not climb a closed stepladder. Make sure that both sides of the stepladder are locked into place. Ask someone to hold it for you, if possible. Clearly mark and make sure that you can see: Any grab bars or handrails. First and last steps. Where the edge of each step is. Use tools that help you move around (mobility aids) if they are needed. These include: Canes. Walkers. Scooters. Crutches. Turn on the lights when you go into a dark area. Replace any light bulbs as soon as they burn out. Set up your furniture so you have a clear path. Avoid moving your furniture around. If any of your floors are uneven, fix them. If there are any pets around you, be aware of where they are. Review your medicines with your doctor. Some medicines can make you feel dizzy. This can increase your chance of falling. Ask your doctor what other things that you can do to help prevent falls. This information is not intended to replace advice given to you by your health care provider. Make sure you discuss any questions you have with your health care provider. Document Released: 09/20/2009 Document Revised: 05/01/2016 Document Reviewed: 12/29/2014 Elsevier Interactive Patient Education  2017 [employer].

## 2023-03-26 NOTE — Progress Notes (Signed)
Subjective:   PHYLLIP CLAW is a 52 y.o. male who presents for Medicare Annual/Subsequent preventive examination.  Review of Systems     Cardiac Risk Factors include: male gender;dyslipidemia;smoking/ tobacco exposure     Objective:    Today's Vitals   03/27/23 1001  BP: 106/62  Weight: 199 lb (90.3 kg)  Height: 5\' 8"  (1.727 m)   Body mass index is 30.26 kg/m.     03/27/2023   10:36 AM 03/18/2022   10:05 AM 04/26/2021   11:11 AM 10/12/2020    8:26 AM 09/16/2020    8:52 AM 06/04/2020    5:57 PM 02/15/2020    6:13 AM  Advanced Directives  Does Patient Have a Medical Advance Directive? No Yes No No No No No  Type of Furniture conservator/restorer;Living will       Copy of Healthcare Power of Attorney in Chart?  No - copy requested       Would patient like information on creating a medical advance directive? Yes (MAU/Ambulatory/Procedural Areas - Information given)  No - Patient declined Yes (MAU/Ambulatory/Procedural Areas - Information given)       Current Medications (verified) Outpatient Encounter Medications as of 03/27/2023  Medication Sig   albuterol (PROVENTIL) (2.5 MG/3ML) 0.083% nebulizer solution Take 3 mLs (2.5 mg total) by nebulization at bedtime.   albuterol (VENTOLIN HFA) 108 (90 Base) MCG/ACT inhaler TAKE 2 PUFFS BY MOUTH EVERY 6 HOURS AS NEEDED FOR WHEEZE OR SHORTNESS OF BREATH   atorvastatin (LIPITOR) 20 MG tablet TAKE 1 TABLET BY MOUTH EVERY DAY   budesonide (PULMICORT) 0.5 MG/2ML nebulizer solution TAKE 2 MLS (0.5 MG TOTAL) BY NEBULIZATION DAILY.   cetirizine (ZYRTEC) 10 MG tablet TAKE 1 TABLET BY MOUTH EVERY DAY   fluticasone (FLONASE) 50 MCG/ACT nasal spray SPRAY 2 SPRAYS INTO EACH NOSTRIL EVERY DAY (Patient taking differently: Place 2 sprays into both nostrils daily as needed for rhinitis.)   formoterol (PERFOROMIST) 20 MCG/2ML nebulizer solution Take 2 mLs (20 mcg total) by nebulization at bedtime.   No facility-administered encounter  medications on file as of 03/27/2023.    Allergies (verified) Wellbutrin [bupropion], Chantix [varenicline tartrate], and Penicillins   History: Past Medical History:  Diagnosis Date   Allergy    Asthma    COPD (chronic obstructive pulmonary disease)    Hypercholesterolemia    Pneumonia    Past Surgical History:  Procedure Laterality Date   arm surgery     LEFT   COLONOSCOPY WITH PROPOFOL N/A 10/12/2020   Procedure: COLONOSCOPY WITH PROPOFOL;  Surgeon: Lanelle Bal, DO;  Location: [facility];  Service: Endoscopy;  Laterality: N/A;  9:30   LEG SURGERY     LEFT   Family History  Problem Relation Age of Onset   Asthma Father    Emphysema Father        smoked   Asthma Mother    Emphysema Mother        smoked   Eczema Brother    Allergic rhinitis Neg Hx    Angioedema Neg Hx    Atopy Neg Hx    Immunodeficiency Neg Hx    Urticaria Neg Hx    Social History   Socioeconomic History   Marital status: Married    Spouse name: Foye Clock   Number of children: 2   Years of education: 8   Highest education level: Not on file  Occupational History   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   Married x 14 years, 03/14/2022 but have been together over 20 years per pt.    Social Determinants of Health   Financial Resource Strain: Low Risk  (03/27/2023)   Overall Financial Resource Strain (CARDIA)    Difficulty of Paying Living Expenses: Not hard at all  Food Insecurity: No Food Insecurity (03/27/2023)   Hunger Vital Sign    Worried About  Running Out of Food in the Last Year: Never true    Ran Out of Food in the Last Year: Never true  Transportation Needs: No Transportation Needs (03/27/2023)   PRAPARE - Administrator, Civil Service (Medical): No    Lack of Transportation (Non-Medical): No  Physical Activity: Sufficiently Active (03/27/2023)   Exercise Vital Sign    Days of Exercise per Week: 7 days    Minutes of Exercise per Session: 90 min  Stress: No Stress Concern Present (03/27/2023)   [employer] of Occupational Health - Occupational Stress Questionnaire    Feeling of Stress : Not at all  Social Connections: Moderately Isolated (03/27/2023)   Social Connection and Isolation Panel [NHANES]    Frequency of Communication with Friends and Family: More than three times a week    Frequency of Social Gatherings with Friends and Family: More than three times a week    Attends Religious Services: Never    Database administrator or Organizations: No    Attends Engineer, structural: Never    Marital Status: Married    Tobacco Counseling Ready to quit: Not Answered Counseling given: Not Answered   Clinical Intake:  Pre-visit preparation completed: Yes  Pain : No/denies pain     Diabetes: No  How often do you need to have someone help you when you read instructions, pamphlets, or other written materials from your doctor or pharmacy?: 2 - Rarely  Diabetic?No   Interpreter Needed?: No  Information entered by :: Kandis Fantasia LPN   Activities of Daily Living    03/27/2023   10:36 AM 03/26/2023    8:34 AM  In your present state of health, do you have any difficulty performing the following activities:  Hearing? 0 0  Vision? 0 0  Difficulty concentrating or making decisions? 0 0  Walking or climbing stairs? 0 0  Dressing or bathing? 0 0  Doing errands, shopping? 0 0  Preparing Food and eating ? N N  Using the Toilet? N N  In the past six months, have you accidently leaked urine? N  N  Do you have problems with loss of bowel control? N N  Managing your Medications? N N  Managing your Finances? N N  Housekeeping or managing your Housekeeping? N N    Patient Care Team: Babs Sciara, MD as PCP - General (Family Medicine) Jena Gauss Gerrit Friends, MD as Consulting Physician (Gastroenterology)  Indicate any recent Medical Services you may have received from other than  [facility] providers in the past year (date may be approximate).     Assessment:   This is a routine wellness examination for Ashante.  Hearing/Vision screen Hearing Screening - Comments:: Denies hearing difficulties   Vision Screening - Comments:: No vision problems; will schedule routine eye exam soon    Dietary issues and exercise activities discussed: Current Exercise Habits: Home exercise routine, Type of exercise: strength training/weights;stretching;walking, Time (Minutes): 60, Frequency (Times/Week): 7, Weekly Exercise (Minutes/Week): 420, Intensity: Moderate   Goals Addressed   None    Depression Screen    03/27/2023   10:15 AM 10/22/2022    8:50 AM 03/18/2022   10:01 AM 10/21/2021    1:36 PM 05/01/2021   11:04 AM 03/19/2021    2:29 PM  PHQ 2/9 Scores  PHQ - 2 Score 0 0 0 0 0 0  PHQ- 9 Score  0        Fall Risk    03/27/2023   10:14 AM 03/26/2023    8:34 AM 10/22/2022    8:50 AM 03/18/2022   10:06 AM 03/10/2022    5:54 PM  Fall Risk   Falls in the past year? 0 0 0 0 0  Number falls in past yr: 0  0 0 0  Injury with Fall? 0 0 0 0 0  Risk for fall due to : No Fall Risks  No Fall Risks No Fall Risks   Follow up Falls prevention discussed;Education provided;Falls evaluation completed  Falls evaluation completed Falls prevention discussed     FALL RISK PREVENTION PERTAINING TO THE HOME:  Any stairs in or around the home? No  If so, are there any without handrails? No  Home free of loose throw rugs in walkways, pet beds, electrical cords, etc? Yes  Adequate lighting in your home to reduce risk  of falls? Yes   ASSISTIVE DEVICES UTILIZED TO PREVENT FALLS:  Life alert? No  Use of a cane, walker or w/c? No  Grab bars in the bathroom? Yes  Shower chair or bench in shower? No  Elevated toilet seat or a handicapped toilet? Yes   TIMED UP AND GO:  Was the test performed? Yes .  Length of time to ambulate 10 feet: 5 sec.   Gait steady and fast without use of assistive device  Cognitive Function:        03/27/2023    9:58 AM 03/18/2022   10:08 AM  6CIT Screen  What Year? 0 points 4 points  What month? 0 points 0 points  What time? 0 points 0 points  Count back from 20 0 points 0 points  Months in reverse 0 points 4 points  Repeat phrase 0 points 0 points  Total Score 0 points 8 points    Immunizations Immunization History  Administered Date(s) Administered   Influenza,inj,Quad PF,6+ Mos 09/13/2019, 10/08/2021   Influenza-Unspecified 10/23/2018   Moderna Sars-Covid-2 Vaccination 02/29/2020, 03/28/2020, 11/09/2020   Td 08/13/2012   Tdap 09/12/2018    TDAP status: Up to date  Flu Vaccine status: Up to date  Covid-19 vaccine status: Information provided on how to obtain vaccines.   Qualifies for Shingles Vaccine? Yes   Zostavax completed No   Shingrix Completed?: No.    Education has been provided regarding the importance of this vaccine. Patient has been advised to call insurance company to determine out of pocket expense if they have not yet received this vaccine. Advised may also receive vaccine at local pharmacy or Health Dept. Verbalized acceptance  and understanding.  Screening Tests Health Maintenance  Topic Date Due   Zoster Vaccines- Shingrix (1 of 2) Never done   COVID-19 Vaccine (4 - 2023-24 season) 08/08/2022   INFLUENZA VACCINE  07/09/2023   Medicare Annual Wellness (AWV)  03/26/2024   DTaP/Tdap/Td (3 - Td or Tdap) 09/12/2028   COLONOSCOPY (Pts 45-93yrs Insurance coverage will need to be confirmed)  10/12/2030   Hepatitis C Screening  Completed    HIV Screening  Completed   HPV VACCINES  Aged Out   Lung Cancer Screening  Discontinued    Health Maintenance  Health Maintenance Due  Topic Date Due   Zoster Vaccines- Shingrix (1 of 2) Never done   COVID-19 Vaccine (4 - 2023-24 season) 08/08/2022    Colorectal cancer screening: Type of screening: Colonoscopy. Completed 10/12/20. Repeat every 10 years  Lung Cancer Screening: (Low Dose CT Chest recommended if Age 32-80 years, 30 pack-year currently smoking OR have quit w/in 15years.) does not qualify.   Lung Cancer Screening Referral: n/a  Additional Screening:  Hepatitis C Screening: does qualify; Completed 10/22/21  Vision Screening: Recommended annual ophthalmology exams for early detection of glaucoma and other disorders of the eye. Is the patient up to date with their annual eye exam?  No  Who is the provider or what is the name of the office in which the patient attends annual eye exams? none If pt is not established with a provider, would they like to be referred to a provider to establish care? No .   Dental Screening: Recommended annual dental exams for proper oral hygiene  Community Resource Referral / Chronic Care Management: CRR required this visit?  No   CCM required this visit?  No      Plan:     I have personally reviewed and noted the following in the patient's chart:   Medical and social history Use of alcohol, tobacco or illicit drugs  Current medications and supplements including opioid prescriptions. Patient is not currently taking opioid prescriptions. Functional ability and status Nutritional status Physical activity Advanced directives List of other physicians Hospitalizations, surgeries, and ER visits in previous 12 months Vitals Screenings to include cognitive, depression, and falls Referrals and appointments  In addition, I have reviewed and discussed with patient certain preventive protocols, quality metrics, and best practice  recommendations. A written personalized care plan for preventive services as well as general preventive health recommendations were provided to patient.     Kandis Fantasia [place], [place]   1/61/0960   Nurse Notes: No concerns

## 2023-03-27 ENCOUNTER — Ambulatory Visit: Payer: No Typology Code available for payment source

## 2023-03-27 VITALS — BP 106/62 | Ht 68.0 in | Wt 199.0 lb

## 2023-03-27 DIAGNOSIS — Z0000 Encounter for general adult medical examination without abnormal findings: Secondary | ICD-10-CM

## 2023-04-01 ENCOUNTER — Other Ambulatory Visit: Payer: Self-pay

## 2023-04-01 DIAGNOSIS — Z87891 Personal history of nicotine dependence: Secondary | ICD-10-CM

## 2023-04-01 DIAGNOSIS — Z122 Encounter for screening for malignant neoplasm of respiratory organs: Secondary | ICD-10-CM

## 2023-04-01 NOTE — Progress Notes (Signed)
LDCT order placed per protocol °

## 2023-04-04 ENCOUNTER — Telehealth: Payer: Self-pay | Admitting: Family Medicine

## 2023-04-04 ENCOUNTER — Other Ambulatory Visit: Payer: Self-pay | Admitting: Family Medicine

## 2023-04-04 DIAGNOSIS — E7849 Other hyperlipidemia: Secondary | ICD-10-CM

## 2023-04-04 DIAGNOSIS — Z79899 Other long term (current) drug therapy: Secondary | ICD-10-CM

## 2023-04-04 MED ORDER — ATORVASTATIN CALCIUM 20 MG PO TABS: 20.0000 mg | ORAL_TABLET | Freq: Every day | ORAL | 1 refills | Status: DC

## 2023-04-04 NOTE — Telephone Encounter (Signed)
Nurses I would like for the patient to do lipid liver before his follow-up visit May please order please inform the patient either by phone or MyChart message to do his labs before his follow-up office visit thank you

## 2023-04-06 NOTE — Telephone Encounter (Signed)
Blood work ordered in EPIC. Patient notified. 

## 2023-04-17 DIAGNOSIS — Z79899 Other long term (current) drug therapy: Secondary | ICD-10-CM | POA: Diagnosis not present

## 2023-04-17 DIAGNOSIS — E7849 Other hyperlipidemia: Secondary | ICD-10-CM | POA: Diagnosis not present

## 2023-04-18 LAB — HEPATIC FUNCTION PANEL
ALT: 42 IU/L (ref 0–44)
AST: 30 IU/L (ref 0–40)
Albumin: 4.3 g/dL (ref 3.8–4.9)
Alkaline Phosphatase: 109 IU/L (ref 44–121)
Bilirubin Total: 0.4 mg/dL (ref 0.0–1.2)
Bilirubin, Direct: 0.13 mg/dL (ref 0.00–0.40)
Total Protein: 6.8 g/dL (ref 6.0–8.5)

## 2023-04-18 LAB — LIPID PANEL
Chol/HDL Ratio: 3.2 ratio (ref 0.0–5.0)
Cholesterol, Total: 131 mg/dL (ref 100–199)
HDL: 41 mg/dL (ref >39)
LDL Chol Calc (NIH): 75 mg/dL (ref 0–99)
Triglycerides: 72 mg/dL (ref 0–149)
VLDL Cholesterol Cal: 15 mg/dL (ref 5–40)

## 2023-04-22 ENCOUNTER — Ambulatory Visit: Payer: No Typology Code available for payment source | Admitting: Family Medicine

## 2023-04-22 DIAGNOSIS — E7849 Other hyperlipidemia: Secondary | ICD-10-CM

## 2023-04-22 MED ORDER — ATORVASTATIN CALCIUM 40 MG PO TABS: 40.0000 mg | ORAL_TABLET | Freq: Every day | ORAL | 1 refills | Status: DC

## 2023-04-22 NOTE — Progress Notes (Unsigned)
   Subjective:    Patient ID: Tony Doyle, male    DOB: October 21, 1971, 52 y.o.   MRN: [id number]  Hyperlipidemia This is a chronic problem. The current episode started more than 1 year ago. Treatments tried: Lipitor. There are no compliance problems.  Risk factors for coronary artery disease include dyslipidemia.   Patient is taking his medicine Try to watch his diet We talked longer hard about quitting smoking He is at extreme risk of having complications Encouraged him to quit He is considering it    Review of Systems     Objective:   Physical Exam General-in no acute distress Eyes-no discharge Lungs-respiratory rate normal, CTA CV-no murmurs,RRR Extremities skin warm dry no edema Neuro grossly normal Behavior normal, alert        Assessment & Plan:  Hyperlipidemia bump up the dose of the statin to get better control recheck labs again by fall time follow-up for complete checkup and wellness, October or November He has tried Chantix in the past had side effects Wellbutrin did not work so he is can work hard to try to quit on his own I encouraged him to try patches

## 2023-05-28 ENCOUNTER — Ambulatory Visit (HOSPITAL_COMMUNITY)
Admission: RE | Admit: 2023-05-28 | Discharge: 2023-05-28 | Disposition: A | Discharge status: Home / Self Care | Payer: No Typology Code available for payment source | Source: Ambulatory Visit | Attending: Physician Assistant | Admitting: Physician Assistant

## 2023-05-28 DIAGNOSIS — Z122 Encounter for screening for malignant neoplasm of respiratory organs: Secondary | ICD-10-CM | POA: Diagnosis not present

## 2023-05-28 DIAGNOSIS — F17210 Nicotine dependence, cigarettes, uncomplicated: Secondary | ICD-10-CM | POA: Diagnosis not present

## 2023-05-28 DIAGNOSIS — Z87891 Personal history of nicotine dependence: Secondary | ICD-10-CM | POA: Diagnosis not present

## 2023-06-03 NOTE — Progress Notes (Signed)
Patient notified of LDCT Lung Cancer Screening Results via telephone with the recommendation to follow-up in 6 months. Patient's referring provider has been sent a copy of results. Results are as follows:  IMPRESSION: 1. Lung-RADS 3S, probably benign findings. Short-term follow-up in 6 months is recommended with repeat low-dose chest CT without contrast (please use the following order, "CT CHEST LCS NODULE FOLLOW-UP W/O CM"). 2. The "S" modifier above refers to potentially clinically significant non lung cancer related findings. Specifically, there is aortic atherosclerosis, in addition to left anterior descending coronary artery disease. Please note that although the presence of coronary artery calcium documents the presence of coronary artery disease, the severity of this disease and any potential stenosis cannot be assessed on this non-gated CT examination. Assessment for potential risk factor modification, dietary therapy or pharmacologic therapy may be warranted, if clinically indicated. 3. Mild diffuse bronchial wall thickening with mild centrilobular and paraseptal emphysema; imaging findings suggestive of underlying COPD.   Aortic Atherosclerosis (ICD10-I70.0) and Emphysema (ICD10-J43.9).

## 2023-07-08 ENCOUNTER — Other Ambulatory Visit: Payer: Self-pay

## 2023-07-08 ENCOUNTER — Ambulatory Visit (INDEPENDENT_AMBULATORY_CARE_PROVIDER_SITE_OTHER): Payer: No Typology Code available for payment source | Admitting: Family Medicine

## 2023-07-08 ENCOUNTER — Encounter: Payer: Self-pay | Admitting: Family Medicine

## 2023-07-08 VITALS — BP 102/66 | HR 80 | Temp 100.2°F | Resp 12 | Ht 68.31 in | Wt 196.2 lb

## 2023-07-08 DIAGNOSIS — E8801 Alpha-1-antitrypsin deficiency: Secondary | ICD-10-CM | POA: Diagnosis not present

## 2023-07-08 DIAGNOSIS — J4489 Other specified chronic obstructive pulmonary disease: Secondary | ICD-10-CM

## 2023-07-08 DIAGNOSIS — J310 Chronic rhinitis: Secondary | ICD-10-CM

## 2023-07-08 DIAGNOSIS — F17200 Nicotine dependence, unspecified, uncomplicated: Secondary | ICD-10-CM

## 2023-07-08 MED ORDER — CETIRIZINE HCL 10 MG PO TABS: 10.0000 mg | ORAL_TABLET | Freq: Every day | ORAL | 5 refills | Status: AC

## 2023-07-08 MED ORDER — FORMOTEROL FUMARATE 20 MCG/2ML IN NEBU: 20.0000 ug | INHALATION_SOLUTION | Freq: Every day | RESPIRATORY_TRACT | 5 refills | Status: DC

## 2023-07-08 NOTE — Patient Instructions (Addendum)
Asthma Increase Pulmicort and Perforomist mixed together in the nebulizer once a day You may use albuterol 2 puffs once every 4 hours as needed for cough or wheeze Continue albuterol 2 puffs 5 to 15 minutes before activity to decrease cough or wheeze  Nonallergic rhinitis Continue cetirizine 10 mg once a day as needed for runny nose or itch Continue Flonase 1 to 2 sprays in each nostril once a day as needed for stuffy nose Consider saline nasal rinses as needed for nasal symptoms. Use this before any medicated nasal sprays for best result  Tobacco use Try to cut down on smoking cigarettes and better to quit.   Written information provided from up-to-date about smoking cessation   Call the clinic if this treatment plan is not working well for you.  Follow up in 3 months or sooner if needed.

## 2023-07-08 NOTE — Progress Notes (Signed)
[address] [place] [address] Dept: [phone]  FOLLOW UP NOTE  Patient ID: Tony Doyle, male    DOB: 10-Jun-1971  Age: 52 y.o. MRN: [id number] Date of Office Visit: 07/08/2023  Assessment  Chief Complaint: Follow-up  HPI Tony Doyle is a 52 year old male who presents to the clinic for follow-up visit.  He was last seen in this clinic on 03/26/2022 by Dr. 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  Today's visit, he reports his asthma/COPD has been moderately well-controlled with dry cough while lying down as the main symptom.  He reports this cough occurs 1-2 times a week and is aggravated by heat and humidity.  He denies shortness of breath or wheeze with activity or rest.  He continues Pulmicort and Perforomist via nebulizer once a day about 2 to 3 days a week and rarely uses albuterol for relief of symptoms.  Allergic rhinitis is reported as well-controlled with no shortness of rhinorrhea, nasal congestion, sneezing, or postnasal drainage.  He continues cetirizine 10 mg once a day and uses Flonase as needed with relief of symptoms.  He is not currently using a nasal saline rinse.  He continues to smoke between 8 and 20 cigarettes a day.  He reports the number of cigarettes increases when he is not busy.  He reports that he likes to stay busy.  His current medications are listed in the chart.  Drug Allergies:  Allergies  Allergen Reactions   Wellbutrin [Bupropion]     dizziness   Chantix [Varenicline Tartrate]     Severe dreams   Penicillins Rash and Other (See Comments)    Was told he was allergic, unknown reaction Childhood/ Tried a year ago    Physical Exam: BP 102/66   Pulse 80   Temp 100.2 F (37.9 C) (Temporal)   Resp 12   Ht 5' 8.31" (1.735 m)    Wt 196 lb 3.2 oz (89 kg)   SpO2 96%   BMI 29.56 kg/m    Physical Exam Vitals reviewed.  Constitutional:      Appearance: Normal appearance.  HENT:     Head: Normocephalic and atraumatic.     Right Ear: Tympanic membrane normal.     Left Ear: Tympanic membrane normal.     Nose:     Comments: Bilateral nares slightly erythematous with thin clear nasal drainage noted.  Pharynx normal.  Ears normal.  Eyes normal.    Mouth/Throat:     Pharynx: Oropharynx is clear.  Eyes:     Conjunctiva/sclera: Conjunctivae normal.  Cardiovascular:     Rate and Rhythm: Normal rate and regular rhythm.     Heart sounds: Normal heart sounds. No murmur heard. Pulmonary:     Effort: Pulmonary effort is normal.     Breath sounds: Normal breath sounds.     Comments: Lungs clear to auscultation Musculoskeletal:        General: Normal range of motion.     Cervical back: Normal range of motion and neck supple.  Skin:    General: Skin is warm and dry.  Neurological:     Mental Status: He is alert and oriented to person, place, and time.  Psychiatric:        Mood and Affect: Mood normal.  Behavior: Behavior normal.        Thought Content: Thought content normal.        Judgment: Judgment normal.     Diagnostics: 3.47 which is 82% of predicted value, FEV1 2.28 which is 63% of predicted value.  Spirometry indicates possible obstruction.  Assessment and Plan: 1. Asthma with COPD   2. Smoker   3. Chronic rhinitis   4. Heterozygous alpha 1-antitrypsin deficiency (HCC)     Meds ordered this encounter  Medications   formoterol (PERFOROMIST) 20 MCG/2ML nebulizer solution    Sig: Take 2 mLs (20 mcg total) by nebulization at bedtime.    Dispense:  60 mL    Refill:  5   cetirizine (ZYRTEC) 10 MG tablet    Sig: Take 1 tablet (10 mg total) by mouth daily.    Dispense:  30 tablet    Refill:  5    Patient Instructions  Asthma Increase Pulmicort and Perforomist mixed together in the nebulizer  once a day You may use albuterol 2 puffs once every 4 hours as needed for cough or wheeze Continue albuterol 2 puffs 5 to 15 minutes before activity to decrease cough or wheeze  Nonallergic rhinitis Continue cetirizine 10 mg once a day as needed for runny nose or itch Continue Flonase 1 to 2 sprays in each nostril once a day as needed for stuffy nose Consider saline nasal rinses as needed for nasal symptoms. Use this before any medicated nasal sprays for best result  Tobacco use Try to cut down on smoking cigarettes and better to quit.   Written information provided from up-to-date about smoking cessation   Call the clinic if this treatment plan is not working well for you.  Follow up in 3 months or sooner if needed.   Return in about 3 months (around 10/08/2023), or if symptoms worsen or fail to improve.    Thank you for the opportunity to care for this patient.  Please do not hesitate to contact me with questions.  Thermon Leyland, FNP [facility]

## 2023-08-14 DIAGNOSIS — I700 Atherosclerosis of aorta: Secondary | ICD-10-CM | POA: Diagnosis not present

## 2023-08-14 DIAGNOSIS — Z008 Encounter for other general examination: Secondary | ICD-10-CM | POA: Diagnosis not present

## 2023-08-14 DIAGNOSIS — Z6828 Body mass index (BMI) 28.0-28.9, adult: Secondary | ICD-10-CM | POA: Diagnosis not present

## 2023-08-14 DIAGNOSIS — E663 Overweight: Secondary | ICD-10-CM | POA: Diagnosis not present

## 2023-08-14 DIAGNOSIS — J4540 Moderate persistent asthma, uncomplicated: Secondary | ICD-10-CM | POA: Diagnosis not present

## 2023-08-14 DIAGNOSIS — J449 Chronic obstructive pulmonary disease, unspecified: Secondary | ICD-10-CM | POA: Diagnosis not present

## 2023-08-30 ENCOUNTER — Other Ambulatory Visit: Payer: Self-pay | Admitting: Family Medicine

## 2023-08-30 DIAGNOSIS — E7849 Other hyperlipidemia: Secondary | ICD-10-CM

## 2023-09-01 ENCOUNTER — Ambulatory Visit: Payer: Self-pay

## 2023-09-08 ENCOUNTER — Other Ambulatory Visit: Payer: Self-pay | Admitting: Allergy & Immunology

## 2023-09-09 DIAGNOSIS — H524 Presbyopia: Secondary | ICD-10-CM | POA: Diagnosis not present

## 2023-10-06 ENCOUNTER — Other Ambulatory Visit: Payer: Self-pay | Admitting: Allergy & Immunology

## 2023-10-26 ENCOUNTER — Encounter: Payer: Self-pay | Admitting: Family Medicine

## 2023-10-26 ENCOUNTER — Ambulatory Visit (INDEPENDENT_AMBULATORY_CARE_PROVIDER_SITE_OTHER): Payer: No Typology Code available for payment source | Admitting: Family Medicine

## 2023-10-26 VITALS — BP 102/61 | HR 68 | Temp 98.1°F | Ht 68.31 in | Wt 200.6 lb

## 2023-10-26 DIAGNOSIS — F17200 Nicotine dependence, unspecified, uncomplicated: Secondary | ICD-10-CM | POA: Diagnosis not present

## 2023-10-26 DIAGNOSIS — E7849 Other hyperlipidemia: Secondary | ICD-10-CM | POA: Diagnosis not present

## 2023-10-26 DIAGNOSIS — Z79899 Other long term (current) drug therapy: Secondary | ICD-10-CM | POA: Diagnosis not present

## 2023-10-26 DIAGNOSIS — Z23 Encounter for immunization: Secondary | ICD-10-CM

## 2023-10-26 DIAGNOSIS — Z125 Encounter for screening for malignant neoplasm of prostate: Secondary | ICD-10-CM

## 2023-10-26 MED ORDER — BUPROPION HCL ER (SR) 150 MG PO TB12: 150.0000 mg | ORAL_TABLET | Freq: Two times a day (BID) | ORAL | 2 refills | Status: DC

## 2023-10-26 MED ORDER — ATORVASTATIN CALCIUM 40 MG PO TABS: 40.0000 mg | ORAL_TABLET | Freq: Every day | ORAL | 1 refills | Status: DC

## 2023-10-26 NOTE — Patient Instructions (Signed)
Steps to Quit Smoking Smoking tobacco is the leading cause of preventable death. It can affect almost every organ in the body. Smoking puts you and those around you at risk for developing many serious chronic diseases. Quitting smoking can be very challenging. Do not get discouraged if you are not successful the first time. Some people need to make many attempts to quit before they achieve long-term success. Do your best to stick to your quit plan, and talk with your health care provider if you have any questions or concerns. How do I get ready to quit? When you decide to quit smoking, create a plan to help you succeed. Before you quit: Pick a date to quit. Set a date within the next 2 weeks to give you time to prepare. Write down the reasons why you are quitting. Keep this list in places where you will see it often. Tell your family, friends, and co-workers that you are quitting. Support from people you are close to can make quitting easier. Talk with your health care provider about your options for quitting smoking. Find out what treatment options are covered by your health insurance. Identify people, places, things, and activities that make you want to smoke (triggers). Avoid them. What first steps can I take to quit smoking? Throw away all cigarettes at home, at work, and in your car. Throw away smoking accessories, such as Set designer. Clean your car. Make sure to empty the ashtray. Clean your home, including curtains and carpets. What strategies can I use to quit smoking? Talk with your health care provider about combining strategies, such as taking medicines while you are also receiving in-person counseling. Using these two strategies together makes you more likely to succeed in quitting than if you used either strategy on its own. If you are pregnant or breastfeeding, talk with your health care provider about finding counseling or other support strategies to quit smoking. Do not  take medicine to help you quit smoking unless your health care provider tells you to. Quit right away Quit smoking completely, instead of gradually reducing how much you smoke over a period of time. Stopping smoking right away may be more successful than gradually quitting. Attend in-person counseling to help you build problem-solving skills. You are more likely to succeed in quitting if you attend counseling sessions regularly. Even short sessions of 10 minutes can be effective. Take medicine You may take medicines to help you quit smoking. Some medicines require a prescription. You can also purchase over-the-counter medicines. Medicines may have nicotine in them to replace the nicotine in cigarettes. Medicines may: Help to stop cravings. Help to relieve withdrawal symptoms. Your health care provider may recommend: Nicotine patches, gum, or lozenges. Nicotine inhalers or sprays. Non-nicotine medicine that you take by mouth. Find resources Find resources and support systems that can help you quit smoking and remain smoke-free after you quit. These resources are most helpful when you use them often. They include: Online chats with a Veterinary surgeon. Telephone quitlines. Printed Materials engineer. Support groups or group counseling. Text messaging programs. Mobile phone apps or applications. Use apps that can help you stick to your quit plan by providing reminders, tips, and encouragement. Examples of free services include Quit Guide from the CDC and smokefree.gov  What can I do to make it easier to quit?  Reach out to your family and friends for support and encouragement. Call telephone quitlines, such as 1-800-QUIT-NOW, reach out to support groups, or work with a Veterinary surgeon for  support. Ask people who smoke to avoid smoking around you. Avoid places that trigger you to smoke, such as bars, parties, or smoke-break areas at work. Spend time with people who do not smoke. Lessen the stress in your  life. Stress can be a smoking trigger for some people. To lessen stress, try: Exercising regularly. Doing deep-breathing exercises. Doing yoga. Meditating. What benefits will I see if I quit smoking? Over time, you should start to see positive results, such as: Improved sense of smell and taste. Decreased coughing and sore throat. Slower heart rate. Lower blood pressure. Clearer and healthier skin. The ability to breathe more easily. Fewer sick days. Summary Quitting smoking can be very challenging. Do not get discouraged if you are not successful the first time. Some people need to make many attempts to quit before they achieve long-term success. When you decide to quit smoking, create a plan to help you succeed. Quit smoking right away, not slowly over a period of time. Find resources and support systems that can help you quit smoking and remain smoke-free after you quit. This information is not intended to replace advice given to you by your health care provider. Make sure you discuss any questions you have with your health care provider. Document Revised: 11/15/2021 Document Reviewed: 11/15/2021 Elsevier Patient Education  2024 [employer].

## 2023-10-26 NOTE — Progress Notes (Signed)
   Subjective:    Patient ID: Tony Doyle, male    DOB: [birth year], 52 y.o.   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 He has been adherent to his cholesterol medication and has been maintaining a healthy diet. His breathing has been satisfactory, requiring albuterol use in the mornings and before bed. He also continues to use budesonide nebulizer once a day. He has an upcoming follow-up appointment with his allergist.  The patient's bowel movements have been regular, and he has a good urinary flow with no hematuria. He has expressed an intention to receive a flu vaccine. He has been maintaining a good energy level, waking up early and going to bed late.  The patient has been struggling with smoking, currently at about a pack and a half a day. He has tried nicotine patches in the past but found that they increased his craving for cigarettes. He has expressed a desire to quit smoking and is open to trying Wellbutrin again, despite previous intolerance. He has also considered using nicotine gum for urges to smoke.  The patient has been keeping busy with work and family matters, which has helped distract from the urge to smoke. However, he anticipates that his workload will decrease soon, which may increase his smoking frequency. He is seeking assistance in managing this habit.     Extremely nice patient We did discuss smoking cessation     Review of Systems     Objective:    Physical Exam   CHEST: lungs clear to auscultation CARDIOVASCULAR: normal heart sounds EXTREMITIES: no edema           Assessment & Plan:  Assessment and Plan    Asthma Stable with daily use of Albuterol and Budesonide. Regular follow-ups with an allergist. -Continue current regimen. -Next follow-up with allergist next week.  Hyperlipidemia Adherent to cholesterol  medication. -Continue current regimen.  Tobacco Use Increased smoking to 1.5 packs per day. Previous trials of Chantix and Wellbutrin were not tolerated. Nicotine patches increased desire to smoke. -Start Wellbutrin again to aid in smoking cessation. -Provide information on nicotine gum for use during cravings.  General Health Maintenance -Administer influenza vaccine today. -Order annual blood work, including PSA. -Follow-up after results are available.     1. Other hyperlipidemia Continue statin check lipid profile healthy diet - atorvastatin (LIPITOR) 40 MG tablet; Take 1 tablet (40 mg total) by mouth daily.  Dispense: 90 tablet; Refill: 1 - Lipid Panel  2. Smoker Patient states Chantix caused serious side effects he is willing to try Wellbutrin again and desires to do so if he has significant side effects he will stop He states that nicotine patches did not help him Information was given to the patient regarding nicotine gum Patient counseled to quit smoking 3. Screening PSA (prostate specific antigen) Screening recommended - PSA  4. High risk medication use Lab work recommended - Basic Metabolic Panel - Hepatic Function Panel  5. Immunization due Flu shot today - Flu vaccine trivalent PF, 6mos and older(Flulaval,Afluria,Fluarix,Fluzone)  Patient to follow-up within 6 months

## 2023-11-09 DIAGNOSIS — Z79899 Other long term (current) drug therapy: Secondary | ICD-10-CM | POA: Diagnosis not present

## 2023-11-09 DIAGNOSIS — E7849 Other hyperlipidemia: Secondary | ICD-10-CM | POA: Diagnosis not present

## 2023-11-09 DIAGNOSIS — Z125 Encounter for screening for malignant neoplasm of prostate: Secondary | ICD-10-CM | POA: Diagnosis not present

## 2023-11-10 LAB — LIPID PANEL
Chol/HDL Ratio: 3.1 {ratio} (ref 0.0–5.0)
Cholesterol, Total: 121 mg/dL (ref 100–199)
HDL: 39 mg/dL — ABNORMAL LOW (ref >39)
LDL Chol Calc (NIH): 64 mg/dL (ref 0–99)
Triglycerides: 93 mg/dL (ref 0–149)
VLDL Cholesterol Cal: 18 mg/dL (ref 5–40)

## 2023-11-10 LAB — BASIC METABOLIC PANEL
BUN/Creatinine Ratio: 14 (ref 9–20)
BUN: 12 mg/dL (ref 6–24)
CO2: 23 mmol/L (ref 20–29)
Calcium: 9.2 mg/dL (ref 8.7–10.2)
Chloride: 102 mmol/L (ref 96–106)
Creatinine, Ser: 0.88 mg/dL (ref 0.76–1.27)
Glucose: 99 mg/dL (ref 70–99)
Potassium: 4.4 mmol/L (ref 3.5–5.2)
Sodium: 139 mmol/L (ref 134–144)
eGFR: 102 mL/min/{1.73_m2} (ref >59)

## 2023-11-10 LAB — HEPATIC FUNCTION PANEL
ALT: 40 [IU]/L (ref 0–44)
AST: 24 [IU]/L (ref 0–40)
Albumin: 4.1 g/dL (ref 3.8–4.9)
Alkaline Phosphatase: 105 [IU]/L (ref 44–121)
Bilirubin Total: 0.4 mg/dL (ref 0.0–1.2)
Bilirubin, Direct: 0.17 mg/dL (ref 0.00–0.40)
Total Protein: 6.8 g/dL (ref 6.0–8.5)

## 2023-11-10 LAB — PSA: Prostate Specific Ag, Serum: 0.4 ng/mL (ref 0.0–4.0)

## 2023-11-16 ENCOUNTER — Telehealth: Payer: Self-pay | Admitting: Family Medicine

## 2023-11-16 DIAGNOSIS — R911 Solitary pulmonary nodule: Secondary | ICD-10-CM

## 2023-11-16 NOTE — Telephone Encounter (Signed)
Patient is due for a CT scan of the lung due to abnormal nodule on previous CT scan which was done back in end of April  Oncology notified us that patient will need to have follow-up scans Please call patient let him know that this needs to be done please go ahead and order this scan thank you

## 2023-11-19 ENCOUNTER — Other Ambulatory Visit: Payer: Self-pay | Admitting: Family Medicine

## 2023-11-19 NOTE — Telephone Encounter (Signed)
Patient notified

## 2023-11-19 NOTE — Telephone Encounter (Signed)
CT ordered in EPIC. Left message to return call to notify patient

## 2023-11-23 ENCOUNTER — Ambulatory Visit (HOSPITAL_COMMUNITY)
Admission: RE | Admit: 2023-11-23 | Discharge: 2023-11-23 | Disposition: A | Discharge status: Home / Self Care | Payer: No Typology Code available for payment source | Source: Ambulatory Visit | Attending: Family Medicine | Admitting: Family Medicine

## 2023-11-23 DIAGNOSIS — R911 Solitary pulmonary nodule: Secondary | ICD-10-CM | POA: Insufficient documentation

## 2023-11-23 DIAGNOSIS — F17210 Nicotine dependence, cigarettes, uncomplicated: Secondary | ICD-10-CM | POA: Diagnosis not present

## 2023-12-07 ENCOUNTER — Telehealth: Payer: Self-pay

## 2023-12-07 NOTE — Telephone Encounter (Signed)
Received a call from [facility] radiology as a courtesy call to let you know about the patient's CT lung screen results.

## 2023-12-07 NOTE — Telephone Encounter (Signed)
Please see result note Very important that this result be called to the patient and documented that the patient understands to do the follow-up scan in March plus also put it into the reminder file and also if he would like to do a in person office visit sooner to discuss this we are willing to do so thank you

## 2023-12-07 NOTE — Telephone Encounter (Signed)
See result note.  

## 2023-12-10 ENCOUNTER — Other Ambulatory Visit: Payer: Self-pay

## 2023-12-10 DIAGNOSIS — Z122 Encounter for screening for malignant neoplasm of respiratory organs: Secondary | ICD-10-CM

## 2023-12-10 DIAGNOSIS — Z87891 Personal history of nicotine dependence: Secondary | ICD-10-CM

## 2024-01-01 ENCOUNTER — Other Ambulatory Visit: Payer: Self-pay | Admitting: Family Medicine

## 2024-01-15 ENCOUNTER — Ambulatory Visit: Payer: No Typology Code available for payment source | Admitting: Family

## 2024-01-15 ENCOUNTER — Ambulatory Visit: Payer: No Typology Code available for payment source | Admitting: Family Medicine

## 2024-02-09 ENCOUNTER — Ambulatory Visit (HOSPITAL_COMMUNITY)
Admission: RE | Admit: 2024-02-09 | Discharge: 2024-02-09 | Disposition: A | Discharge status: Home / Self Care | Payer: No Typology Code available for payment source | Source: Ambulatory Visit | Attending: Physician Assistant | Admitting: Physician Assistant

## 2024-02-09 DIAGNOSIS — I700 Atherosclerosis of aorta: Secondary | ICD-10-CM | POA: Diagnosis not present

## 2024-02-09 DIAGNOSIS — F17210 Nicotine dependence, cigarettes, uncomplicated: Secondary | ICD-10-CM | POA: Insufficient documentation

## 2024-02-09 DIAGNOSIS — I2510 Atherosclerotic heart disease of native coronary artery without angina pectoris: Secondary | ICD-10-CM | POA: Insufficient documentation

## 2024-02-09 DIAGNOSIS — R911 Solitary pulmonary nodule: Secondary | ICD-10-CM | POA: Insufficient documentation

## 2024-02-09 DIAGNOSIS — J439 Emphysema, unspecified: Secondary | ICD-10-CM | POA: Diagnosis not present

## 2024-02-09 DIAGNOSIS — Z122 Encounter for screening for malignant neoplasm of respiratory organs: Secondary | ICD-10-CM | POA: Diagnosis not present

## 2024-02-09 DIAGNOSIS — Z87891 Personal history of nicotine dependence: Secondary | ICD-10-CM | POA: Diagnosis present

## 2024-02-09 DIAGNOSIS — J432 Centrilobular emphysema: Secondary | ICD-10-CM | POA: Diagnosis not present

## 2024-02-16 DIAGNOSIS — J449 Chronic obstructive pulmonary disease, unspecified: Secondary | ICD-10-CM | POA: Diagnosis not present

## 2024-02-16 DIAGNOSIS — E663 Overweight: Secondary | ICD-10-CM | POA: Diagnosis not present

## 2024-02-16 DIAGNOSIS — Z008 Encounter for other general examination: Secondary | ICD-10-CM | POA: Diagnosis not present

## 2024-02-16 DIAGNOSIS — E785 Hyperlipidemia, unspecified: Secondary | ICD-10-CM | POA: Diagnosis not present

## 2024-02-16 DIAGNOSIS — Z6829 Body mass index (BMI) 29.0-29.9, adult: Secondary | ICD-10-CM | POA: Diagnosis not present

## 2024-02-16 DIAGNOSIS — E8801 Alpha-1-antitrypsin deficiency: Secondary | ICD-10-CM | POA: Diagnosis not present

## 2024-02-16 DIAGNOSIS — J4520 Mild intermittent asthma, uncomplicated: Secondary | ICD-10-CM | POA: Diagnosis not present

## 2024-03-07 ENCOUNTER — Encounter: Payer: Self-pay | Admitting: *Deleted

## 2024-03-07 ENCOUNTER — Other Ambulatory Visit: Payer: Self-pay

## 2024-03-07 DIAGNOSIS — Z122 Encounter for screening for malignant neoplasm of respiratory organs: Secondary | ICD-10-CM

## 2024-03-07 DIAGNOSIS — Z87891 Personal history of nicotine dependence: Secondary | ICD-10-CM

## 2024-03-07 NOTE — Progress Notes (Signed)
 Received call report from [facility] Radiology on abnormal CT Lung Screening.  Rojelio Brenner, PAC made aware.

## 2024-03-07 NOTE — Progress Notes (Signed)
 Patient notified of LDCT Lung Cancer Screening Results via telephone with the recommendation to follow-up in 6 months. Patient's referring provider has been sent a copy of results. Results are as follows:  IMPRESSION: 1. New 4.1 mm left upper lobe nodule, possibly infectious/inflammatory in etiology. Lung-RADS 3, probably benign findings. Short-term follow-up in 6 months is recommended with repeat low-dose chest CT without contrast (please use the following order, "CT CHEST LCS NODULE FOLLOW-UP W/O CM"). These results will be called to the ordering clinician or representative by the Radiologist Assistant, and communication documented in the PACS or [employer]. 2. Age advanced left anterior descending coronary artery calcification. 3.  Aortic atherosclerosis (ICD10-I70.0). 4.  Emphysema (ICD10-J43.9).

## 2024-03-08 ENCOUNTER — Other Ambulatory Visit: Payer: Self-pay

## 2024-03-08 ENCOUNTER — Telehealth: Payer: Self-pay | Admitting: Family Medicine

## 2024-03-08 DIAGNOSIS — E7849 Other hyperlipidemia: Secondary | ICD-10-CM

## 2024-03-08 DIAGNOSIS — Z79899 Other long term (current) drug therapy: Secondary | ICD-10-CM

## 2024-03-08 NOTE — Telephone Encounter (Signed)
 Nurses Patient recently had CT scan through cancer screening program.  He is aware of the results. Let him know that we were also made aware of the results.  Most importantly I would like for the patient to do lab work by early June with follow-up visit in June Recommend lipid, ALT, metabolic 7-hyperlipidemia, high risk med  Recommend he go ahead and schedule his office visit as well

## 2024-03-27 ENCOUNTER — Telehealth: Payer: Self-pay | Admitting: Family Medicine

## 2024-03-27 NOTE — Telephone Encounter (Signed)
 Nurses His recent CT scan shows a new nodule Patient is aware Does need to have a follow-up noncontrast chest CT This will be done at the end of September Please put this into the reminder file Please also have the patient go ahead schedule currently office visit for late August or early September Patient to do lab work closer to that time Please make sure patient goes ahead and schedule Appreciated thank you

## 2024-03-29 ENCOUNTER — Other Ambulatory Visit: Payer: Self-pay

## 2024-03-29 DIAGNOSIS — R911 Solitary pulmonary nodule: Secondary | ICD-10-CM

## 2024-04-01 ENCOUNTER — Ambulatory Visit: Payer: No Typology Code available for payment source

## 2024-04-01 VITALS — Ht 68.0 in | Wt 200.0 lb

## 2024-04-01 DIAGNOSIS — Z0000 Encounter for general adult medical examination without abnormal findings: Secondary | ICD-10-CM

## 2024-04-01 NOTE — Progress Notes (Signed)
 Subjective:   Tony Doyle is a 53 y.o. who presents for a Medicare Wellness preventive visit.  Visit Complete: Virtual I connected with  Tony Doyle on 04/01/24 by a audio enabled telemedicine application and verified that I am speaking with the correct person using two identifiers.  Patient Location: Home  Provider Location: Home Office  I discussed the limitations of evaluation and management by telemedicine. The patient expressed understanding and agreed to proceed.  Vital Signs: Because this visit was a virtual/telehealth visit, some criteria may be missing or patient reported. Any vitals not documented were not able to be obtained and vitals that have been documented are patient reported.  VideoDeclined- This patient declined Librarian, academic. Therefore the visit was completed with audio only.  Persons Participating in Visit: Patient.  AWV Questionnaire: No: Patient Medicare AWV questionnaire was not completed prior to this visit.  Cardiac Risk Factors include: male gender;smoking/ tobacco exposure;dyslipidemia     Objective:    Today's Vitals   04/01/24 1108  Weight: 200 lb (90.7 kg)  Height: 5\' 8"  (1.727 m)   Body mass index is 30.41 kg/m.     04/01/2024   11:14 AM 03/27/2023   10:36 AM 03/18/2022   10:05 AM 04/26/2021   11:11 AM 10/12/2020    8:26 AM 09/16/2020    8:52 AM 06/04/2020    5:57 PM  Advanced Directives  Does Patient Have a Medical Advance Directive? No No Yes No No No No  Type of Surveyor, minerals;Living will      Copy of Healthcare Power of Attorney in Chart?   No - copy requested      Would patient like information on creating a medical advance directive? Yes (MAU/Ambulatory/Procedural Areas - Information given) Yes (MAU/Ambulatory/Procedural Areas - Information given)  No - Patient declined Yes (MAU/Ambulatory/Procedural Areas - Information given)      Current Medications  (verified) Outpatient Encounter Medications as of 04/01/2024  Medication Sig   albuterol  (PROVENTIL ) (2.5 MG/3ML) 0.083% nebulizer solution Take 3 mLs (2.5 mg total) by nebulization at bedtime.   albuterol  (VENTOLIN  HFA) 108 (90 Base) MCG/ACT inhaler TAKE 2 PUFFS BY MOUTH EVERY 6 HOURS AS NEEDED FOR WHEEZE OR SHORTNESS OF BREATH   atorvastatin  (LIPITOR) 40 MG tablet Take 1 tablet (40 mg total) by mouth daily.   budesonide  (PULMICORT ) 0.5 MG/2ML nebulizer solution TAKE 2 MLS (0.5 MG TOTAL) BY NEBULIZATION DAILY   buPROPion  (WELLBUTRIN  SR) 150 MG 12 hr tablet TAKE 1 TABLET BY MOUTH TWICE A DAY   cetirizine  (ZYRTEC ) 10 MG tablet Take 1 tablet (10 mg total) by mouth daily.   fluticasone  (FLONASE ) 50 MCG/ACT nasal spray SPRAY 2 SPRAYS INTO EACH NOSTRIL EVERY DAY (Patient taking differently: Place 2 sprays into both nostrils daily as needed for rhinitis.)   formoterol  (PERFOROMIST ) 20 MCG/2ML nebulizer solution Take 2 mLs (20 mcg total) by nebulization at bedtime.   No facility-administered encounter medications on file as of 04/01/2024.    Allergies (verified) Wellbutrin  [bupropion ], Chantix [varenicline tartrate], and Penicillins   History: Past Medical History:  Diagnosis Date   Allergy    Asthma    COPD (chronic obstructive pulmonary disease) (HCC)    Hypercholesterolemia    Pneumonia    Past Surgical History:  Procedure Laterality Date   arm surgery     LEFT   COLONOSCOPY WITH PROPOFOL  N/A 10/12/2020   Procedure: COLONOSCOPY WITH PROPOFOL ;  Surgeon: Vinetta Greening, DO;  Location: [facility];  Service: Endoscopy;  Laterality: N/A;  9:30   LEG SURGERY     LEFT   Family History  Problem Relation Age of Onset   Asthma Father    Emphysema Father        smoked   Asthma Mother    Emphysema Mother        smoked   Eczema Brother    Allergic rhinitis Neg Hx    Angioedema Neg Hx    Atopy Neg Hx    Immunodeficiency Neg Hx    Urticaria Neg Hx    Social History    Socioeconomic History   Marital status: Married    Spouse name: Merritt Ables   Number of children: 2   Years of education: 8   Highest education level: 8th grade  Occupational History   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   Married x 14 years, 03/14/2022 but have been together over 20 years per pt.    Social Drivers of Corporate investment banker Strain: Low Risk  (04/01/2024)   Overall Financial Resource Strain (CARDIA)    Difficulty of Paying Living Expenses: Not hard at all  Food Insecurity: No Food Insecurity (04/01/2024)   Hunger Vital Sign    Worried About Running Out of Food in the Last Year: Never true    Ran Out of Food in the Last Year: Never true  Transportation Needs: No Transportation Needs (04/01/2024)   PRAPARE - Administrator, Civil Service (Medical): No    Lack of Transportation (Non-Medical): No  Physical Activity: Sufficiently Active (04/01/2024)   Exercise Vital Sign    Days of Exercise per Week: 7 days    Minutes of Exercise per Session: 120 min  Stress: No Stress Concern Present (04/01/2024)   [employer] of Occupational Health - Occupational Stress Questionnaire    Feeling of Stress : Not at all  Social Connections: Moderately Integrated (04/01/2024)   Social Connection and Isolation Panel [NHANES]    Frequency of Communication with Friends and Family: More than three times a week    Frequency of Social Gatherings with Friends and Family: Three times a week    Attends Religious Services: 1 to 4 times  per year    Active Member of Clubs or Organizations: No    Attends Banker Meetings: Never    Marital Status: Married    Tobacco Counseling Ready to quit: Not Answered Counseling given: Not Answered    Clinical Intake:  Pre-visit preparation completed: Yes  Pain : No/denies pain     Diabetes: No  Lab Results  Component Value Date   HGBA1C 5.3 10/28/2022   HGBA1C 5.7 (H) 05/06/2022     How often do you need to have someone help you when you read instructions, pamphlets, or other written materials from your doctor or pharmacy?: 1 - Never  Interpreter Needed?: No  Information entered by :: Jearlean Mince  Rozalynn Buege LPN   Activities of Daily Living     04/01/2024   11:10 AM  In your present state of health, do you have any difficulty performing the following activities:  Hearing? 0  Vision? 0  Difficulty concentrating or making decisions? 0  Walking or climbing stairs? 0  Dressing or bathing? 0  Doing errands, shopping? 0  Preparing Food and eating ? N  Using the Toilet? N  In the past six months, have you accidently leaked urine? N  Do you have problems with loss of bowel control? N  Managing your Medications? N  Managing your Finances? N  Housekeeping or managing your Housekeeping? N    Patient Care Team: Bennet Brasil, MD as PCP - General (Family Medicine) Riley Cheadle Windsor Hatcher, MD as Consulting Physician (Gastroenterology)  Indicate any recent Medical Services you may have received from other than [facility] providers in the past year (date may be approximate).     Assessment:   This is a routine wellness examination for Laakea.  Hearing/Vision screen Hearing Screening - Comments:: Denies hearing difficulties   Vision Screening - Comments:: No vision problems; will schedule routine eye exam soon     Goals Addressed             This Visit's Progress    Exercise 3x per week (30 min per time)   On track    Continue to exercise and stay healthy.        Depression Screen     04/01/2024   11:13 AM 10/26/2023    8:51 AM 04/22/2023    8:47 AM 03/27/2023   10:15 AM 10/22/2022    8:50 AM 03/18/2022   10:01 AM 10/21/2021    1:36 PM  PHQ 2/9 Scores  PHQ - 2 Score 0 0 0 0 0 0 0  PHQ- 9 Score  0   0      Fall Risk     04/01/2024   11:14 AM 10/26/2023    8:51 AM 03/27/2023   10:14 AM 03/26/2023    8:34 AM 10/22/2022    8:50 AM  Fall Risk   Falls in the past year? 0 0 0 0 0  Number falls in past yr: 0  0  0  Injury with Fall? 0  0 0 0  Risk for fall due to : No Fall Risks  No Fall Risks  No Fall Risks  Follow up Falls prevention discussed;Education provided;Falls evaluation completed  Falls prevention discussed;Education provided;Falls evaluation completed  Falls evaluation completed    MEDICARE RISK AT HOME:  Medicare Risk at Home Any stairs in or around the home?: No If so, are there any without handrails?: No Home free of loose throw rugs in walkways, pet beds, electrical cords, etc?: Yes Adequate lighting in your home to reduce risk of falls?: Yes Life alert?: No Use of a cane, walker or w/c?: No Grab bars in the bathroom?: Yes Shower chair or bench in shower?: No Elevated toilet seat or a handicapped toilet?: Yes  TIMED UP AND GO:  Was the test performed?  No  Cognitive Function: 6CIT completed        04/01/2024   11:14 AM 03/27/2023    9:58 AM 03/18/2022   10:08 AM  6CIT Screen  What Year? 0 points 0 points 4 points  What month? 0 points 0 points 0 points  What time? 0 points 0 points 0 points  Count back from 20 0 points 0 points 0  points  Months in reverse 0 points 0 points 4 points  Repeat phrase 0 points 0 points 0 points  Total Score 0 points 0 points 8 points    Immunizations Immunization History  Administered Date(s) Administered   Influenza, Seasonal, Injecte, Preservative Fre 10/26/2023   Influenza,inj,Quad PF,6+ Mos 09/13/2019, 10/08/2021   Influenza-Unspecified 10/23/2018   Moderna Sars-Covid-2  Vaccination 02/29/2020, 03/28/2020, 11/09/2020   Td 08/13/2012   Tdap 09/12/2018    Screening Tests Health Maintenance  Topic Date Due   Pneumococcal Vaccine 26-48 Years old (1 of 2 - PCV) Never done   Zoster Vaccines- Shingrix (1 of 2) Never done   COVID-19 Vaccine (4 - 2024-25 season) 08/09/2023   INFLUENZA VACCINE  07/08/2024   Medicare Annual Wellness (AWV)  04/01/2025   DTaP/Tdap/Td (3 - Td or Tdap) 09/12/2028   Colonoscopy  10/12/2030   Hepatitis C Screening  Completed   HIV Screening  Completed   HPV VACCINES  Aged Out   Meningococcal B Vaccine  Aged Out   Lung Cancer Screening  Discontinued    Health Maintenance  Health Maintenance Due  Topic Date Due   Pneumococcal Vaccine 44-75 Years old (1 of 2 - PCV) Never done   Zoster Vaccines- Shingrix (1 of 2) Never done   COVID-19 Vaccine (4 - 2024-25 season) 08/09/2023    Additional Screening:  Vision Screening: Recommended annual ophthalmology exams for early detection of glaucoma and other disorders of the eye.  Dental Screening: Recommended annual dental exams for proper oral hygiene  Community Resource Referral / Chronic Care Management: CRR required this visit?  No   CCM required this visit?  No     Plan:     I have personally reviewed and noted the following in the patient's chart:   Medical and social history Use of alcohol , tobacco or illicit drugs  Current medications and supplements including opioid prescriptions. Patient is not currently taking opioid prescriptions. Functional ability and status Nutritional status Physical activity Advanced directives List of other physicians Hospitalizations, surgeries, and ER visits in previous 12 months Vitals Screenings to include cognitive, depression, and falls Referrals and appointments  In addition, I have reviewed and discussed with patient certain preventive protocols, quality metrics, and best practice recommendations. A written personalized care  plan for preventive services as well as general preventive health recommendations were provided to patient.     [place], [place]   [id number]   After Visit Summary: (MyChart) Due to this being a telephonic visit, the after visit summary with patients personalized plan was offered to patient via MyChart   Notes: Nothing significant to report at this time.

## 2024-04-01 NOTE — Patient Instructions (Signed)
 Tony Doyle , Thank you for taking time to come for your Medicare Wellness Visit. I appreciate your ongoing commitment to your health goals. Please review the following plan we discussed and let me know if I can assist you in the future.   Referrals/Orders/Follow-Ups/Clinician Recommendations: Aim for 30 minutes of exercise or brisk walking, 6-8 glasses of water , and 5 servings of fruits and vegetables each day.  This is a list of the screening recommended for you and due dates:  Health Maintenance  Topic Date Due   Pneumococcal Vaccination (1 of 2 - PCV) Never done   Zoster (Shingles) Vaccine (1 of 2) Never done   COVID-19 Vaccine (4 - 2024-25 season) 08/09/2023   Flu Shot  07/08/2024   Medicare Annual Wellness Visit  04/01/2025   DTaP/Tdap/Td vaccine (3 - Td or Tdap) 09/12/2028   Colon Cancer Screening  10/12/2030   Hepatitis C Screening  Completed   HIV Screening  Completed   HPV Vaccine  Aged Out   Meningitis B Vaccine  Aged Out   Screening for Lung Cancer  Discontinued    Advanced directives: (ACP Link)Information on Advanced Care Planning can be found at [place]  Best boy Advance Health Care Directives Advance Health Care Directives. [email or web address]   Next Medicare Annual Wellness Visit scheduled for next year: Yes

## 2024-04-06 ENCOUNTER — Encounter: Payer: Self-pay | Admitting: Family Medicine

## 2024-04-06 ENCOUNTER — Emergency Department (HOSPITAL_COMMUNITY)

## 2024-04-06 ENCOUNTER — Other Ambulatory Visit (HOSPITAL_COMMUNITY)
Admission: RE | Admit: 2024-04-06 | Discharge: 2024-04-06 | Disposition: A | Discharge status: Home / Self Care | Source: Ambulatory Visit | Attending: Family Medicine | Admitting: Family Medicine

## 2024-04-06 ENCOUNTER — Ambulatory Visit (INDEPENDENT_AMBULATORY_CARE_PROVIDER_SITE_OTHER): Admitting: Family Medicine

## 2024-04-06 ENCOUNTER — Other Ambulatory Visit: Payer: Self-pay

## 2024-04-06 ENCOUNTER — Encounter (HOSPITAL_COMMUNITY): Payer: Self-pay

## 2024-04-06 ENCOUNTER — Emergency Department (HOSPITAL_COMMUNITY)
Admission: EM | Admit: 2024-04-06 | Discharge: 2024-04-06 | Disposition: A | Discharge status: Home / Self Care | Attending: Emergency Medicine | Admitting: Emergency Medicine

## 2024-04-06 VITALS — BP 112/65 | HR 63 | Temp 98.1°F | Ht 68.0 in | Wt 201.0 lb

## 2024-04-06 DIAGNOSIS — R918 Other nonspecific abnormal finding of lung field: Secondary | ICD-10-CM | POA: Diagnosis not present

## 2024-04-06 DIAGNOSIS — R0789 Other chest pain: Secondary | ICD-10-CM | POA: Diagnosis not present

## 2024-04-06 DIAGNOSIS — J449 Chronic obstructive pulmonary disease, unspecified: Secondary | ICD-10-CM | POA: Insufficient documentation

## 2024-04-06 DIAGNOSIS — R079 Chest pain, unspecified: Secondary | ICD-10-CM | POA: Insufficient documentation

## 2024-04-06 DIAGNOSIS — I2510 Atherosclerotic heart disease of native coronary artery without angina pectoris: Secondary | ICD-10-CM | POA: Diagnosis not present

## 2024-04-06 DIAGNOSIS — J432 Centrilobular emphysema: Secondary | ICD-10-CM | POA: Diagnosis not present

## 2024-04-06 LAB — CBC WITH DIFFERENTIAL/PLATELET
Abs Immature Granulocytes: 0.01 10*3/uL (ref 0.00–0.07)
Basophils Absolute: 0 10*3/uL (ref 0.0–0.1)
Basophils Relative: 1 %
Eosinophils Absolute: 0.1 10*3/uL (ref 0.0–0.5)
Eosinophils Relative: 2 %
HCT: 42.9 % (ref 39.0–52.0)
Hemoglobin: 14.3 g/dL (ref 13.0–17.0)
Immature Granulocytes: 0 %
Lymphocytes Relative: 51 %
Lymphs Abs: 2.8 10*3/uL (ref 0.7–4.0)
MCH: 28.8 pg (ref 26.0–34.0)
MCHC: 33.3 g/dL (ref 30.0–36.0)
MCV: 86.3 fL (ref 80.0–100.0)
Monocytes Absolute: 0.3 10*3/uL (ref 0.1–1.0)
Monocytes Relative: 5 %
Neutro Abs: 2.3 10*3/uL (ref 1.7–7.7)
Neutrophils Relative %: 41 %
Platelets: 169 10*3/uL (ref 150–400)
RBC: 4.97 MIL/uL (ref 4.22–5.81)
RDW: 13.5 % (ref 11.5–15.5)
WBC: 5.6 10*3/uL (ref 4.0–10.5)
nRBC: 0 % (ref 0.0–0.2)

## 2024-04-06 LAB — BASIC METABOLIC PANEL WITH GFR
Anion gap: 10 (ref 5–15)
Anion gap: 9 (ref 5–15)
BUN: 17 mg/dL (ref 6–20)
BUN: 18 mg/dL (ref 6–20)
CO2: 23 mmol/L (ref 22–32)
CO2: 22 mmol/L (ref 22–32)
Calcium: 9.5 mg/dL (ref 8.9–10.3)
Calcium: 9 mg/dL (ref 8.9–10.3)
Chloride: 103 mmol/L (ref 98–111)
Chloride: 104 mmol/L (ref 98–111)
Creatinine, Ser: 1.04 mg/dL (ref 0.61–1.24)
Creatinine, Ser: 0.98 mg/dL (ref 0.61–1.24)
GFR, Estimated: >60 mL/min (ref >60)
GFR, Estimated: >60 mL/min (ref >60)
Glucose, Bld: 74 mg/dL (ref 70–99)
Glucose, Bld: 105 mg/dL — ABNORMAL HIGH (ref 70–99)
Potassium: 4.1 mmol/L (ref 3.5–5.1)
Potassium: 4.2 mmol/L (ref 3.5–5.1)
Sodium: 136 mmol/L (ref 135–145)
Sodium: 135 mmol/L (ref 135–145)

## 2024-04-06 LAB — D-DIMER, QUANTITATIVE: D-Dimer, Quant: 6.03 ug{FEU}/mL — ABNORMAL HIGH (ref 0.00–0.50)

## 2024-04-06 LAB — TROPONIN I (HIGH SENSITIVITY): Troponin I (High Sensitivity): <2 ng/L (ref <18)

## 2024-04-06 MED ORDER — IOHEXOL 350 MG/ML SOLN
75.0000 mL | Freq: Once | INTRAVENOUS | Status: AC | PRN
  Administered 2024-04-06: 75 mL via INTRAVENOUS

## 2024-04-06 MED ORDER — NAPROXEN 500 MG PO TABS: 500.0000 mg | ORAL_TABLET | Freq: Two times a day (BID) | ORAL | 0 refills | Status: AC

## 2024-04-06 NOTE — Discharge Instructions (Signed)
 Your CTA did not show any evidence of blood clots or any other concerning findings.  Continue your home pain medication as needed.  Follow-up with your PCP.

## 2024-04-06 NOTE — Addendum Note (Signed)
 Addended by: Charlotta Cook A on: 04/06/2024 08:32 PM   Modules accepted: Level of Service

## 2024-04-06 NOTE — Patient Instructions (Addendum)
 Start 81 mg Aspirin daily Do your labs at the hospital  You may use Naprosyn  one twice a day for the rib pain- sent to [facility] We will help set you up with a cardiologist as well  If severe issues then go to ER

## 2024-04-06 NOTE — ED Triage Notes (Signed)
 Pt reports chest pain on and off for three days. Pt was seen at PCP and had labs drawn today and found elevated d-dimer.

## 2024-04-06 NOTE — ED Provider Notes (Signed)
 [place] [facility] AT [facility] Provider Note   CSN: [id number] Arrival date & time: 04/06/24  2053     History  No chief complaint on file.   Tony Doyle is a 53 y.o. male.  HPI Patient presents for chest pain.  Medical history includes HLD, COPD.  For the past 3 days, he has had left-sided chest pain.  Pain waxes and wanes in severity.  It is worsened with sneezing or coughing.  He does a lot of yard work and thinks he may have pulled a muscle.  He was seen by PCP today.  Lab work was notable for elevated D-dimer.  He was sent to the ED for further evaluation.  Currently, patient is pain-free.  He denies any shortness of breath.    Home Medications Prior to Admission medications   Medication Sig Start Date End Date Taking? Authorizing Provider  albuterol  (PROVENTIL ) (2.5 MG/3ML) 0.083% nebulizer solution Take 3 mLs (2.5 mg total) by nebulization at bedtime. 03/26/22   Rochester Chuck, MD  albuterol  (VENTOLIN  HFA) 108 [phone] Base) MCG/ACT inhaler TAKE 2 PUFFS BY MOUTH EVERY 6 HOURS AS NEEDED FOR WHEEZE OR SHORTNESS OF BREATH 08/18/22   Rochester Chuck, MD  atorvastatin  (LIPITOR) 40 MG tablet Take 1 tablet (40 mg total) by mouth daily. 10/26/23   Bennet Brasil, MD  budesonide  (PULMICORT ) 0.5 MG/2ML nebulizer solution TAKE 2 MLS (0.5 MG TOTAL) BY NEBULIZATION DAILY 01/01/24   Ambs, Jeanmarie Millet, FNP  buPROPion  (WELLBUTRIN  SR) 150 MG 12 hr tablet TAKE 1 TABLET BY MOUTH TWICE A DAY 11/20/23   Bennet Brasil, MD  cetirizine  (ZYRTEC ) 10 MG tablet Take 1 tablet (10 mg total) by mouth daily. 07/08/23   Ardie Kras, FNP  fluticasone  (FLONASE ) 50 MCG/ACT nasal spray SPRAY 2 SPRAYS INTO EACH NOSTRIL EVERY DAY Patient taking differently: Place 2 sprays into both nostrils daily as needed for rhinitis. 05/01/20   Bennet Brasil, MD  formoterol  (PERFOROMIST ) 20 MCG/2ML nebulizer solution Take 2 mLs (20 mcg total) by nebulization at bedtime. 07/08/23   Ardie Kras, FNP   naproxen  (NAPROSYN ) 500 MG tablet Take 1 tablet (500 mg total) by mouth 2 (two) times daily with a meal. 04/06/24   Fairy Homer, Jackelyn Marvel, MD      Allergies    Wellbutrin  [bupropion ], Chantix [varenicline tartrate], and Penicillins    Review of Systems   Review of Systems  Cardiovascular:  Positive for chest pain.  All other systems reviewed and are negative.   Physical Exam Updated Vital Signs BP (!) 155/68   Pulse 62   Temp 98.5 F (36.9 C)   Resp 18   SpO2 96%  Physical Exam Vitals and nursing note reviewed.  Constitutional:      General: He is not in acute distress.    Appearance: Normal appearance. He is well-developed. He is not ill-appearing, toxic-appearing or diaphoretic.  HENT:     Head: Normocephalic and atraumatic.     Right Ear: External ear normal.     Left Ear: External ear normal.     Nose: Nose normal.     Mouth/Throat:     Mouth: Mucous membranes are moist.  Eyes:     Extraocular Movements: Extraocular movements intact.     Conjunctiva/sclera: Conjunctivae normal.  Cardiovascular:     Rate and Rhythm: Normal rate and regular rhythm.  Pulmonary:     Effort: Pulmonary effort is normal. No respiratory distress.  Abdominal:  General: There is no distension.     Palpations: Abdomen is soft.     Tenderness: There is no abdominal tenderness.  Musculoskeletal:        General: No swelling. Normal range of motion.     Cervical back: Normal range of motion and neck supple.     Right lower leg: No edema.     Left lower leg: No edema.  Skin:    General: Skin is warm and dry.     Capillary Refill: Capillary refill takes less than 2 seconds.     Coloration: Skin is not jaundiced or pale.  Neurological:     General: No focal deficit present.     Mental Status: He is alert and oriented to person, place, and time.     Cranial Nerves: No cranial nerve deficit.     Sensory: No sensory deficit.     Motor: No weakness.     Coordination: Coordination normal.   Psychiatric:        Mood and Affect: Mood normal.        Behavior: Behavior normal.     ED Results / Procedures / Treatments   Labs (all labs ordered are listed, but only abnormal results are displayed) Labs Reviewed  BASIC METABOLIC PANEL WITH GFR - Abnormal; Notable for the following components:      Result Value   Glucose, Bld 105 (*)    All other components within normal limits  CBC WITH DIFFERENTIAL/PLATELET    EKG None  Radiology CT Angio Chest PE W and/or Wo Contrast Result Date: 04/06/2024 CLINICAL DATA:  Chest pain on and off for 3 days.  Elevated D-dimer. EXAM: CT ANGIOGRAPHY CHEST WITH CONTRAST TECHNIQUE: Multidetector CT imaging of the chest was performed using the standard protocol during bolus administration of intravenous contrast. Multiplanar CT image reconstructions and MIPs were obtained to evaluate the vascular anatomy. RADIATION DOSE REDUCTION: This exam was performed according to the departmental dose-optimization program which includes automated exposure control, adjustment of the mA and/or kV according to patient size and/or use of iterative reconstruction technique. CONTRAST:  75mL OMNIPAQUE IOHEXOL 350 MG/ML SOLN COMPARISON:  CT chest 02/09/2024 FINDINGS: Cardiovascular: Negative for acute pulmonary embolism. No pericardial effusion. Mild coronary artery and aortic atherosclerotic calcification. Mediastinum/Nodes: Trachea and esophagus are unremarkable. Shotty mediastinal lymph nodes are likely reactive. Lungs/Pleura: Centrilobular and paraseptal emphysema. Scattered nodules are unchanged including the 4 mm left upper lobe nodule (6/64). Mild diffuse bronchial wall thickening. No focal consolidation, pleural effusion, or pneumothorax. Upper Abdomen: No acute abnormality. Musculoskeletal: No acute fracture. Review of the MIP images confirms the above findings. IMPRESSION: 1. Negative for acute pulmonary embolism. 2. Mild diffuse bronchial wall thickening, likely  chronic bronchitis. Aortic Atherosclerosis (ICD10-I70.0) and Emphysema (ICD10-J43.9). Electronically Signed   By: Rozell Cornet M.D.   On: 04/06/2024 22:07    Procedures Procedures    Medications Ordered in ED Medications  iohexol (OMNIPAQUE) 350 MG/ML injection 75 mL (75 mLs Intravenous Contrast Given 04/06/24 2154)    ED Course/ Medical Decision Making/ A&P                                 Medical Decision Making Amount and/or Complexity of Data Reviewed Labs: ordered. Radiology: ordered.  Risk Prescription drug management.   Patient presenting for 3 days of left-sided chest pain.  Seen by PCP today and had outpatient lab work done.  D-dimer was found to  be elevated at 6.03.  He was into the ED for further evaluation.  Patient's other lab work from his doctor's office visit was reviewed.  Kidney function, electrolytes, and troponin were all normal.  On arrival in the ED, vital signs notable for moderate hypertension.  Patient is well-appearing on exam.  Currently, he is pain-free.  Patient underwent CTA of chest which did not show any evidence of PE.  Patient reports that he does have pain medication at home which has been helping.  He was informed of negative CTA results.  He was discharged in stable condition.        Final Clinical Impression(s) / ED Diagnoses Final diagnoses:  Chest pain, unspecified type    Rx / DC Orders ED Discharge Orders     None         Iva Mariner, MD 04/06/24 2258

## 2024-04-06 NOTE — Progress Notes (Addendum)
   Subjective:    Patient ID: Tony Doyle, male    DOB: 05-Mar-1971, 53 y.o.   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 and with coughing and certain movements and sneezing  Previous CT scan of his chest showed pulmonary nodule that is being followed but it also showed coronary calcifications as well as atherosclerosis his most recent cholesterol profile looked very good  EKG does not show any acute ST segment changes Review of Systems     Objective:   Physical Exam  General-in no acute distress Eyes-no discharge Lungs-respiratory rate normal, CTA CV-no murmurs,RRR Extremities skin warm dry no edema Neuro grossly normal Behavior normal, alert  Pleuritic chest pain on the left side with some tenderness although 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  I doubt a heart attack based on the EKG and his history but we will check a troponin We will check a D-dimer if that comes back abnormal will need referral to the ER for further evaluation Also because his previous CAT scan showed significant atherosclerotic disease of the coronary arteries and he is having some atypical pain we will get cardiology consult as well     Assessment & Plan:   EKG no acute changes Stat lab work ordered to rule out abnormal troponin abnormal D-dimer May or may not have to go to the  ER Although this appears to be musculoskeletal it is possible that there could be underlying coronary artery disease and certainly on his CT scan atherosclerotic disease and coronary artery calcifications are present therefore urgent cardiology consult and hopefully they can do a more progressive test to make sure he does not have a rate limiting impediment to the heart flow of the coronary arteries  I did recommend for him to start 81 mg aspirin Go to ER if he gets worse Await lab results  Blood work came back Troponin negative D-dimer significantly elevated  I spoke with patient in order to be cautious we will need to go ahead and refer him to the ER for further evaluation this evening.  If possible to order CT angio as outpatient in the evening time.  It is potentially risky to wait till tomorrow to do any type of CT scan to rule out pulmonary embolus If no pulmonary embolus he will be treated with musculoskeletal pain plus we will also set up cardiology consult as previously discussed above  Triage was spoken with at ED for them to evaluate elevated D-dimer with chest pain to rule out pulmonary embolus

## 2024-04-11 ENCOUNTER — Encounter: Payer: Self-pay | Admitting: Family Medicine

## 2024-04-11 ENCOUNTER — Ambulatory Visit (INDEPENDENT_AMBULATORY_CARE_PROVIDER_SITE_OTHER): Admitting: Family Medicine

## 2024-04-11 VITALS — BP 129/75 | HR 62 | Temp 97.9°F | Ht 68.0 in | Wt 205.0 lb

## 2024-04-11 DIAGNOSIS — R079 Chest pain, unspecified: Secondary | ICD-10-CM

## 2024-04-11 DIAGNOSIS — E7849 Other hyperlipidemia: Secondary | ICD-10-CM | POA: Diagnosis not present

## 2024-04-11 DIAGNOSIS — I2583 Coronary atherosclerosis due to lipid rich plaque: Secondary | ICD-10-CM

## 2024-04-11 DIAGNOSIS — Z79899 Other long term (current) drug therapy: Secondary | ICD-10-CM | POA: Diagnosis not present

## 2024-04-11 DIAGNOSIS — I2510 Atherosclerotic heart disease of native coronary artery without angina pectoris: Secondary | ICD-10-CM | POA: Diagnosis not present

## 2024-04-11 NOTE — Progress Notes (Signed)
   Subjective:    Patient ID: Tony Doyle, male    DOB: [birth year], 53 y.o.   MRN: [id number]  HPI would like to discuss CT results  Patient working hard on quitting smoking He also relates that he is utilizing a straw to help him quit He is cut back to under half pack a day He is doing a great job of trying to eat healthy and take his meds Recent CT scan showed coronary artery calcification but no blood clots He does have intermittent chest pain sometimes with activity sometimes with no activity at all denies as substernal pressure more as a left side pain and discomfort does get out of breath if he pushes himself denies breaking out in sweats   Review of Systems     Objective:   Physical Exam  General-in no acute distress Eyes-no discharge Lungs-respiratory rate normal, CTA CV-no murmurs,RRR Extremities skin warm dry no edema Neuro grossly normal Behavior normal, alert       Assessment & Plan:  Chest pain Nonspecific History of coronary artery calcification Need to rule out possibility of significant artery buildup and rate limiting flow with angina Very important for him to quit smoking keep blood pressure under good control and keep LDL below 70 Patient was set up to see cardiology end of June but we need to do testing before that Go ahead and order CT morphology of the coronary arteries await the test results Patient understands not to push himself superhard physically He is also been counseled regarding what to watch for regarding when to go to ER

## 2024-04-12 ENCOUNTER — Other Ambulatory Visit (HOSPITAL_COMMUNITY)
Admission: RE | Admit: 2024-04-12 | Discharge: 2024-04-12 | Disposition: A | Discharge status: Home / Self Care | Source: Ambulatory Visit | Attending: Family Medicine | Admitting: Family Medicine

## 2024-04-12 ENCOUNTER — Encounter: Payer: Self-pay | Admitting: Family Medicine

## 2024-04-12 ENCOUNTER — Telehealth: Payer: Self-pay | Admitting: Family Medicine

## 2024-04-12 DIAGNOSIS — E7849 Other hyperlipidemia: Secondary | ICD-10-CM | POA: Insufficient documentation

## 2024-04-12 DIAGNOSIS — Z79899 Other long term (current) drug therapy: Secondary | ICD-10-CM | POA: Insufficient documentation

## 2024-04-12 LAB — LIPID PANEL
Cholesterol: 137 mg/dL (ref 0–200)
HDL: 38 mg/dL — ABNORMAL LOW (ref >40)
LDL Cholesterol: 81 mg/dL (ref 0–99)
Total CHOL/HDL Ratio: 3.6 ratio
Triglycerides: 91 mg/dL (ref <150)
VLDL: 18 mg/dL (ref 0–40)

## 2024-04-12 LAB — ALT: ALT: 44 U/L (ref 0–44)

## 2024-04-12 NOTE — Telephone Encounter (Signed)
 Nurses Please cancel the CT scan of the chest mid May It should be noted yesterday I ordered a CTA morphology of the coronary arteries I still want that test to be done.  Central scheduling will schedule it But asked for the plain CT on May 16 there is no need to do that because he just did a CT scan of his chest about a week ago thank you

## 2024-04-14 ENCOUNTER — Other Ambulatory Visit: Payer: Self-pay

## 2024-04-14 DIAGNOSIS — E7849 Other hyperlipidemia: Secondary | ICD-10-CM

## 2024-04-14 MED ORDER — ATORVASTATIN CALCIUM 80 MG PO TABS: 80.0000 mg | ORAL_TABLET | Freq: Every day | ORAL | 1 refills | Status: DC

## 2024-04-14 MED ORDER — ATORVASTATIN CALCIUM 80 MG PO TABS: 40.0000 mg | ORAL_TABLET | Freq: Every day | ORAL | 1 refills | Status: DC

## 2024-04-14 NOTE — Addendum Note (Signed)
 Addended by: Rooney Cole on: 04/14/2024 02:21 PM   Modules accepted: Orders

## 2024-04-22 ENCOUNTER — Ambulatory Visit (HOSPITAL_COMMUNITY)

## 2024-04-25 ENCOUNTER — Ambulatory Visit: Payer: No Typology Code available for payment source | Admitting: Family Medicine

## 2024-04-26 ENCOUNTER — Encounter (HOSPITAL_COMMUNITY): Payer: Self-pay

## 2024-04-28 ENCOUNTER — Ambulatory Visit (HOSPITAL_COMMUNITY)
Admission: RE | Admit: 2024-04-28 | Discharge: 2024-04-28 | Disposition: A | Discharge status: Home / Self Care | Source: Ambulatory Visit | Attending: Family Medicine | Admitting: Family Medicine

## 2024-04-28 DIAGNOSIS — I2510 Atherosclerotic heart disease of native coronary artery without angina pectoris: Secondary | ICD-10-CM | POA: Diagnosis not present

## 2024-04-28 DIAGNOSIS — R079 Chest pain, unspecified: Secondary | ICD-10-CM | POA: Diagnosis not present

## 2024-04-28 DIAGNOSIS — I2583 Coronary atherosclerosis due to lipid rich plaque: Secondary | ICD-10-CM | POA: Insufficient documentation

## 2024-04-28 MED ORDER — DILTIAZEM HCL 25 MG/5ML IV SOLN: 10.0000 mg | INTRAVENOUS | Status: DC | PRN

## 2024-04-28 MED ORDER — NITROGLYCERIN 0.4 MG SL SUBL
0.8000 mg | SUBLINGUAL_TABLET | Freq: Once | SUBLINGUAL | Status: AC
  Administered 2024-04-28: 0.8 mg via SUBLINGUAL

## 2024-04-28 MED ORDER — METOPROLOL TARTRATE 5 MG/5ML IV SOLN: 10.0000 mg | INTRAVENOUS | Status: DC | PRN

## 2024-04-28 MED ORDER — IOHEXOL 350 MG/ML SOLN
100.0000 mL | Freq: Once | INTRAVENOUS | Status: AC | PRN
  Administered 2024-04-28: 100 mL via INTRAVENOUS

## 2024-04-29 ENCOUNTER — Ambulatory Visit (HOSPITAL_COMMUNITY)
Admission: RE | Admit: 2024-04-29 | Discharge: 2024-04-29 | Disposition: A | Discharge status: Home / Self Care | Source: Ambulatory Visit | Attending: Internal Medicine | Admitting: Internal Medicine

## 2024-04-29 ENCOUNTER — Other Ambulatory Visit: Payer: Self-pay | Admitting: Internal Medicine

## 2024-04-29 DIAGNOSIS — R931 Abnormal findings on diagnostic imaging of heart and coronary circulation: Secondary | ICD-10-CM

## 2024-05-02 ENCOUNTER — Ambulatory Visit: Payer: Self-pay | Admitting: Family Medicine

## 2024-05-11 ENCOUNTER — Other Ambulatory Visit: Payer: Self-pay

## 2024-05-11 DIAGNOSIS — Z79899 Other long term (current) drug therapy: Secondary | ICD-10-CM

## 2024-05-11 DIAGNOSIS — E7849 Other hyperlipidemia: Secondary | ICD-10-CM

## 2024-05-11 MED ORDER — EZETIMIBE 10 MG PO TABS: 10.0000 mg | ORAL_TABLET | Freq: Every day | ORAL | 3 refills | Status: AC

## 2024-05-16 ENCOUNTER — Encounter: Payer: Self-pay | Admitting: Family Medicine

## 2024-05-16 NOTE — Telephone Encounter (Signed)
 Nurses It is hard to know what is causing fatigue and tiredness all the time This could be multiple things I would recommend an office visit with myself in 1 Same-day slots either this week or next week May or may not need to do lab work depending on what we find at the time of the visit

## 2024-05-20 ENCOUNTER — Ambulatory Visit (INDEPENDENT_AMBULATORY_CARE_PROVIDER_SITE_OTHER): Admitting: Family Medicine

## 2024-05-20 ENCOUNTER — Encounter: Payer: Self-pay | Admitting: Family Medicine

## 2024-05-20 VITALS — BP 114/56 | HR 59 | Temp 98.8°F | Ht 68.0 in | Wt 205.0 lb

## 2024-05-20 DIAGNOSIS — R631 Polydipsia: Secondary | ICD-10-CM

## 2024-05-20 DIAGNOSIS — Z79899 Other long term (current) drug therapy: Secondary | ICD-10-CM

## 2024-05-20 DIAGNOSIS — E7849 Other hyperlipidemia: Secondary | ICD-10-CM | POA: Diagnosis not present

## 2024-05-20 DIAGNOSIS — I2583 Coronary atherosclerosis due to lipid rich plaque: Secondary | ICD-10-CM | POA: Diagnosis not present

## 2024-05-20 DIAGNOSIS — I2510 Atherosclerotic heart disease of native coronary artery without angina pectoris: Secondary | ICD-10-CM | POA: Diagnosis not present

## 2024-05-20 DIAGNOSIS — K921 Melena: Secondary | ICD-10-CM | POA: Diagnosis not present

## 2024-05-20 DIAGNOSIS — L237 Allergic contact dermatitis due to plants, except food: Secondary | ICD-10-CM

## 2024-05-20 DIAGNOSIS — R5383 Other fatigue: Secondary | ICD-10-CM

## 2024-05-20 MED ORDER — PREDNISONE 20 MG PO TABS: ORAL_TABLET | ORAL | 0 refills | Status: DC

## 2024-05-20 NOTE — Progress Notes (Signed)
   Subjective:    Patient ID: Tony Doyle, male    DOB: May 07, 1971, 53 y.o.   MRN: [id number]  HPI Fatigued, lack of energy for 2 to 3 weeks . No pain or sob reported Significant fatigue tiredness feeling rundown Denies shortness of breath chest tightness pressure pain Denies tick bites recently denies fever chills sweats denies wheezing difficulty breathing is scheduled to see cardiology next week Has underlying coronary artery disease per review most recent test Had previous CT scan of the chest that did not show lung cancer but needs this on a yearly basis  Discussed the use of AI scribe software for clinical note transcription with the patient, who gave verbal consent to proceed.  History of Present Illness   Tony Doyle is a 53 year old male who presents with fatigue and increased thirst.  He has been experiencing significant fatigue over the past two weeks, with a marked decrease in energy levels. Previously able to complete ten to fifteen yards of work a day, he now tires after two to three yards and needs to rest before continuing. This level of fatigue is unusual for him.  In addition to fatigue, he has increased thirst over the same period. Despite these symptoms, his appetite remains normal. No issues with urination or bowel movements, although he has noticed occasional blood on toilet paper when wiping, occurring twice in the past two weeks.  He has a history of smoking and is currently attempting to cut back with the goal of quitting. He craves cigarettes more when not occupied with work. No recent fevers or significant changes in breathing during physical activity are reported. He mentions a recent tick bite that occurred three to four months ago.  He has been experiencing a poison oak rash for the past three to four days, which has been spreading despite the use of lotion.        Review of Systems     Objective:   Physical Exam General-in no acute  distress Eyes-no discharge Lungs-respiratory rate normal, CTA CV-no murmurs,RRR Extremities skin warm dry no edema Neuro grossly normal Behavior normal, alert  Poison ivy noted on legs and arms      Assessment & Plan:  1. Other hyperlipidemia (Primary) Check lab work keep LDL below 55 if possible - Lipid Panel  2. High risk medication use Labs ordered, patient was encouraged to take coq.10 as a supplement to see if it might help him have more energy - Hepatic Function Panel - CK  3. Coronary artery disease due to lipid rich plaque Patient was encouraged to quit smoking, keep blood pressure under control, keep LDL below 55  4. Other fatigue Lab work ordered - TSH + free T4  5. Blood in stool Check CBC check stool test for blood patient states he has hemorrhoids and when he wipes he sees a small amount of blood that is only happened a couple times in the past few months he is up-to-date on colonoscopy - CBC with Differential - IFOBT POC (occult bld, rslt in office); Future  6. Increased thirst Check lab work including glucose - Basic Metabolic Panel  7. Allergic contact dermatitis due to plants, except food Prednisone  taper over the next 6 to 7 days follow-up if any ongoing troubles warning signs discussed  Follow-up based on lab work and how he is doing otherwise follow-up every 6 months

## 2024-05-21 ENCOUNTER — Ambulatory Visit: Payer: Self-pay | Admitting: Family Medicine

## 2024-05-21 LAB — CBC WITH DIFFERENTIAL/PLATELET
Basophils Absolute: 0.1 10*3/uL (ref 0.0–0.2)
Basos: 1 %
EOS (ABSOLUTE): 0.1 10*3/uL (ref 0.0–0.4)
Eos: 3 %
Hematocrit: 43.5 % (ref 37.5–51.0)
Hemoglobin: 14.6 g/dL (ref 13.0–17.7)
Immature Grans (Abs): 0 10*3/uL (ref 0.0–0.1)
Immature Granulocytes: 0 %
Lymphocytes Absolute: 2.6 10*3/uL (ref 0.7–3.1)
Lymphs: 45 %
MCH: 29.6 pg (ref 26.6–33.0)
MCHC: 33.6 g/dL (ref 31.5–35.7)
MCV: 88 fL (ref 79–97)
Monocytes Absolute: 0.3 10*3/uL (ref 0.1–0.9)
Monocytes: 5 %
Neutrophils Absolute: 2.6 10*3/uL (ref 1.4–7.0)
Neutrophils: 46 %
Platelets: 162 10*3/uL (ref 150–450)
RBC: 4.94 x10E6/uL (ref 4.14–5.80)
RDW: 13.2 % (ref 11.6–15.4)
WBC: 5.7 10*3/uL (ref 3.4–10.8)

## 2024-05-21 LAB — HEPATIC FUNCTION PANEL
ALT: 43 IU/L (ref 0–44)
AST: 31 IU/L (ref 0–40)
Albumin: 4.1 g/dL (ref 3.8–4.9)
Alkaline Phosphatase: 105 IU/L (ref 44–121)
Bilirubin Total: 0.3 mg/dL (ref 0.0–1.2)
Bilirubin, Direct: 0.14 mg/dL (ref 0.00–0.40)
Total Protein: 6.8 g/dL (ref 6.0–8.5)

## 2024-05-21 LAB — LIPID PANEL
Chol/HDL Ratio: 2.7 ratio (ref 0.0–5.0)
Cholesterol, Total: 84 mg/dL — ABNORMAL LOW (ref 100–199)
HDL: 31 mg/dL — ABNORMAL LOW (ref >39)
LDL Chol Calc (NIH): 27 mg/dL (ref 0–99)
Triglycerides: 155 mg/dL — ABNORMAL HIGH (ref 0–149)
VLDL Cholesterol Cal: 26 mg/dL (ref 5–40)

## 2024-05-21 LAB — BASIC METABOLIC PANEL WITH GFR
BUN/Creatinine Ratio: 16 (ref 9–20)
BUN: 13 mg/dL (ref 6–24)
CO2: 25 mmol/L (ref 20–29)
Calcium: 9.3 mg/dL (ref 8.7–10.2)
Chloride: 102 mmol/L (ref 96–106)
Creatinine, Ser: 0.83 mg/dL (ref 0.76–1.27)
Glucose: 85 mg/dL (ref 70–99)
Potassium: 4.7 mmol/L (ref 3.5–5.2)
Sodium: 137 mmol/L (ref 134–144)
eGFR: 104 mL/min/{1.73_m2} (ref >59)

## 2024-05-21 LAB — TSH+FREE T4
Free T4: 1.01 ng/dL (ref 0.82–1.77)
TSH: 2 u[IU]/mL (ref 0.450–4.500)

## 2024-05-31 ENCOUNTER — Encounter: Payer: Self-pay | Admitting: Internal Medicine

## 2024-05-31 ENCOUNTER — Ambulatory Visit: Attending: Internal Medicine | Admitting: Internal Medicine

## 2024-05-31 VITALS — BP 114/64 | HR 64 | Ht 66.0 in | Wt 207.6 lb

## 2024-05-31 DIAGNOSIS — R931 Abnormal findings on diagnostic imaging of heart and coronary circulation: Secondary | ICD-10-CM | POA: Diagnosis not present

## 2024-05-31 MED ORDER — NITROGLYCERIN 0.4 MG SL SUBL: 0.4000 mg | SUBLINGUAL_TABLET | SUBLINGUAL | 2 refills | Status: AC | PRN

## 2024-05-31 NOTE — Progress Notes (Signed)
 Cardiology Office Note  Date: 05/31/2024   ID: Tony Doyle, DOB June 19, 1971, MRN [id number]  PCP:  Alphonsa Glendia LABOR, MD  Cardiologist:  Diannah SHAUNNA Maywood, MD Electrophysiologist:  None   History of Present Illness: Tony Doyle is a 53 y.o. male  Referred to cardiology clinic for evaluation of chest pain.  Patient had persistent chest pain lasting for 3 to 4 days prompting ER visit in April 2025.  Troponin 1 set and EKG with normal limits.  Chest pain eventually subsided spontaneously.  He does not have chest pain with exertion.  He is active at baseline.  He never had any chest pain with exertion.  He underwent CT cardiac that showed coronary calcium  score 17 and moderate CAD in the LAD, FFR negative.  He has chronic stable DOE, no recent worsening.  He reports he has COPD.  Follows with PCP.  No dizziness, lightheadedness, syncope, palpitations or leg swelling.  Smokes cigarettes, 10 cigarettes/day.  Past Medical History:  Diagnosis Date   Allergy    Asthma    COPD (chronic obstructive pulmonary disease) (HCC)    Hypercholesterolemia    Pneumonia     Past Surgical History:  Procedure Laterality Date   arm surgery     LEFT   COLONOSCOPY WITH PROPOFOL  N/A 10/12/2020   Procedure: COLONOSCOPY WITH PROPOFOL ;  Surgeon: Cindie Carlin POUR, DO;  Location: [facility];  Service: Endoscopy;  Laterality: N/A;  9:30   LEG SURGERY     LEFT    Current Outpatient Medications  Medication Sig Dispense Refill   albuterol  (PROVENTIL ) (2.5 MG/3ML) 0.083% nebulizer solution Take 3 mLs (2.5 mg total) by nebulization at bedtime. 90 mL 3   albuterol  (VENTOLIN  HFA) 108 (90 Base) MCG/ACT inhaler TAKE 2 PUFFS BY MOUTH EVERY 6 HOURS AS NEEDED FOR WHEEZE OR SHORTNESS OF BREATH 18 each 2   atorvastatin  (LIPITOR) 80 MG tablet Take 1 tablet (80 mg total) by mouth daily. 90 tablet 1   budesonide  (PULMICORT ) 0.5 MG/2ML nebulizer solution TAKE 2 MLS (0.5 MG TOTAL) BY NEBULIZATION DAILY 180 mL 1    buPROPion  (WELLBUTRIN  SR) 150 MG 12 hr tablet TAKE 1 TABLET BY MOUTH TWICE A DAY 180 tablet 1   cetirizine  (ZYRTEC ) 10 MG tablet Take 1 tablet (10 mg total) by mouth daily. 30 tablet 5   Coenzyme Q10 (COQ10) 100 MG CAPS Take by mouth daily.     ezetimibe  (ZETIA ) 10 MG tablet Take 1 tablet (10 mg total) by mouth daily. 90 tablet 3   fluticasone  (FLONASE ) 50 MCG/ACT nasal spray SPRAY 2 SPRAYS INTO EACH NOSTRIL EVERY DAY (Patient taking differently: Place 2 sprays into both nostrils daily as needed for rhinitis.) 48 mL 1   formoterol  (PERFOROMIST ) 20 MCG/2ML nebulizer solution Take 2 mLs (20 mcg total) by nebulization at bedtime. 60 mL 5   naproxen  (NAPROSYN ) 500 MG tablet Take 1 tablet (500 mg total) by mouth 2 (two) times daily with a meal. 20 tablet 0   nitroGLYCERIN  (NITROSTAT ) 0.4 MG SL tablet Place 1 tablet (0.4 mg total) under the tongue every 5 (five) minutes x 3 doses as needed (if no relief after the 3rd dose proceed to ED or call 911). 25 tablet 2   No current facility-administered medications for this visit.   Allergies:  Wellbutrin  [bupropion ], Chantix [varenicline tartrate], and Penicillins   Social History: The patient  reports that he has been smoking cigarettes. He started smoking about 37 years ago. He has a  18.7 pack-year smoking history. He has never used smokeless tobacco. He reports that he does not drink alcohol  and does not use drugs.   Family History: The patient's family history includes Asthma in his father and mother; Eczema in his brother; Emphysema in his father and mother.   ROS:  Please see the history of present illness. Otherwise, complete review of systems is positive for none  All other systems are reviewed and negative.   Physical Exam: VS:  BP 114/64   Pulse 64   Ht 5' 6 (1.676 m)   Wt 207 lb 9.6 oz (94.2 kg)   SpO2 97%   BMI 33.51 kg/m , BMI Body mass index is 33.51 kg/m.  Wt Readings from Last 3 Encounters:  05/31/24 207 lb 9.6 oz (94.2 kg)   05/20/24 205 lb (93 kg)  04/11/24 205 lb (93 kg)    General: Patient appears comfortable at rest. HEENT: Conjunctiva and lids normal, oropharynx clear with moist mucosa. Neck: Supple, no elevated JVP or carotid bruits, no thyromegaly. Lungs: Clear to auscultation, nonlabored breathing at rest. Cardiac: Regular rate and rhythm, no S3 or significant systolic murmur, no pericardial rub. Abdomen: Soft, nontender, no hepatomegaly, bowel sounds present, no guarding or rebound. Extremities: No pitting edema, distal pulses 2+. Skin: Warm and dry. Musculoskeletal: No kyphosis. Neuropsychiatric: Alert and oriented x3, affect grossly appropriate.  Recent Labwork: 05/20/2024: ALT 43; AST 31; BUN 13; Creatinine, Ser 0.83; Hemoglobin 14.6; Platelets 162; Potassium 4.7; Sodium 137; TSH 2.000     Component Value Date/Time   CHOL 84 (L) 05/20/2024 1125   TRIG 155 (H) 05/20/2024 1125   HDL 31 (L) 05/20/2024 1125   CHOLHDL 2.7 05/20/2024 1125   CHOLHDL 3.6 04/12/2024 0805   VLDL 18 04/12/2024 0805   LDLCALC 27 05/20/2024 1125    Assessment and Plan:  Chest pain, likely noncardiac: Patient had resting chest heaviness for 3 to 4 days prompting ER visit in April 2025.  EKG and troponins within normal limits.  No recurrences.  Active at baseline, he never had any chest pain with exertion.   Moderate CAD in the LAD: No angina.has chronic stable DOE likely from COPD.  No recent worsening.  CT cardiac showed coronary calcium  score of 17 (61st percentile for age and sex matched control), DPV is mild, moderate CAD in the mid LAD (50 to 69% stenosis), nonflow limiting.  Continue aspirin 81 mg once daily, atorvastatin  80 mg nightly and Zetia  10 mg once daily.  SL NG 0.4 mg as needed for chest pain.  Discussed symptoms of CAD and MI.  ER precautions for chest pain provided.  Heart healthy diet and exercise recommendations provided.  Elevated coronary calcium  score: Coronary calcium  score is 17 and TPV is mild.   Continue atorvastatin  80 mg nightly and Zetia  10 mg once daily.  HLD, at goal: Coronary calcium  score 17 and TPV is mild.  Continue atorvastatin  80 mg nightly and Zetia  10 mg once daily.  Goal LDL is less than 70.  Nicotine abuse: Currently smokes 10 cigarettes/day.  Counseling provided.       Medication Adjustments/Labs and Tests Ordered: Current medicines are reviewed at length with the patient today.  Concerns regarding medicines are outlined above.    Disposition:  Follow up 1 year  Signed Flordia Kassem Arleta Maywood, MD, 05/31/2024 11:09 AM    [facility] at [facility] [address] [place], [place], [place] [address]

## 2024-05-31 NOTE — Patient Instructions (Addendum)
 Medication Instructions:  Your physician has recommended you make the following change in your medication:  Nitroglycerin  0.4 mg as needed for chest pain. Dissolve one under tongue for chest pain every 5 minutes up to 3 doses. If no relief, proceed to ED or call 911. Continue taking all other medications as prescribed  Labwork: None  Testing/Procedures: None  Follow-Up: Your physician recommends that you schedule a follow-up appointment in: 1 year. You will receive a reminder call in about 8 months reminding you to schedule your appointment. If you don't receive this call, please contact our office.   Any Other Special Instructions Will Be Listed Below (If Applicable). Thank you for choosing [facility]!     If you need a refill on your cardiac medications before your next appointment, please call your pharmacy.

## 2024-06-17 ENCOUNTER — Encounter: Payer: Self-pay | Admitting: Family Medicine

## 2024-06-17 ENCOUNTER — Other Ambulatory Visit: Payer: Self-pay | Admitting: Family Medicine

## 2024-06-17 MED ORDER — PREDNISONE 20 MG PO TABS: ORAL_TABLET | ORAL | 0 refills | Status: DC

## 2024-07-20 ENCOUNTER — Ambulatory Visit: Payer: Self-pay

## 2024-07-20 ENCOUNTER — Ambulatory Visit

## 2024-07-20 NOTE — Telephone Encounter (Signed)
 I agree that needs to go ahead and be seen

## 2024-07-20 NOTE — Telephone Encounter (Signed)
 FYI Only or Action Required?: Action required by provider: request for appointment.  Patient was last seen in primary care on 05/20/2024 by Alphonsa Glendia LABOR, MD.  Called Nurse Triage reporting Cyst.  Symptoms began a week ago.  Interventions attempted: Nothing.  Symptoms are: gradually worsening.  Triage Disposition: See HCP Within 4 Hours (Or PCP Triage)  Patient/caregiver understands and will follow disposition?: YesCopied from CRM #[id number]. Topic: Clinical - Red Word Triage >> Jul 20, 2024  1:27 PM Montie POUR wrote: Red Word that prompted transfer to Nurse Triage:  He has a bump on the right side of his butt; The pain level is a 10; not red, size of half a dollar to a golf ball. This has been going on for a week and a half Reason for Disposition  SEVERE pain (e.g., excruciating)  Answer Assessment - Initial Assessment Questions Pt has had this same cyst issue before and had it popped and cord was out. Pt going out of town tomorrow and can't do appt. RN advised he got  to UC today and pt agreed to be seen.     1. APPEARANCE of SWELLING: What does it look like?     Eye ball popped out- it's white-like a zit 2. SIZE: How large is the swelling? (e.g., inches, cm; or compare to size of pinhead, tip of pen, eraser, coin, pea, grape, ping pong ball)      Half dollar to golf ball 3. LOCATION: Where is the swelling located?     Right side of bottom  4. ONSET: When did the swelling start?     10 days ago 5. COLOR: What color is it? Is there more than one color?     white 6. PAIN: Is there any pain? If Yes, ask: How bad is the pain? (Scale 1-10; or mild, moderate, severe)       10 when touched 7. ITCH: Does it itch? If Yes, ask: How bad is the itch?      denies 8. CAUSE: What do you think caused the swelling?     cyst 9 OTHER SYMPTOMS: Do you have any other symptoms? (e.g., fever)     denies  Protocols used: Skin Lump or Localized Swelling-A-AH

## 2024-07-21 ENCOUNTER — Telehealth: Payer: Self-pay | Admitting: Family Medicine

## 2024-07-21 NOTE — Telephone Encounter (Signed)
 Nurses Patient is due for low-dose CT scan because of pulmonary nodule in September-essentially the end of September  Please see summary below from previous CT IMPRESSION: 1. New 4.1 mm left upper lobe nodule, possibly infectious/inflammatory in etiology. Lung-RADS 3, probably benign findings. Short-term follow-up in 6 months is recommended with repeat low-dose chest CT without contrast (please use the following order, CT CHEST LCS NODULE FOLLOW-UP W/O CM). These results will be called to the ordering clinician or representative by the Radiologist Assistant, and communication documented in the PACS or [employer]. 2. Age advanced left anterior descending coronary artery calcification. 3.  Aortic atherosclerosis (ICD10-I70.0). 4.  Emphysema (ICD10-J43.9).  Please set up the CT for end of September or start of October thank you Notify patient accordingly

## 2024-07-22 ENCOUNTER — Other Ambulatory Visit: Payer: Self-pay

## 2024-07-22 DIAGNOSIS — R911 Solitary pulmonary nodule: Secondary | ICD-10-CM

## 2024-07-22 NOTE — Telephone Encounter (Signed)
 ordered

## 2024-08-05 ENCOUNTER — Other Ambulatory Visit: Payer: Self-pay | Admitting: *Deleted

## 2024-08-05 DIAGNOSIS — R911 Solitary pulmonary nodule: Secondary | ICD-10-CM

## 2024-08-05 DIAGNOSIS — F17200 Nicotine dependence, unspecified, uncomplicated: Secondary | ICD-10-CM

## 2024-08-14 ENCOUNTER — Other Ambulatory Visit: Payer: Self-pay | Admitting: Family Medicine

## 2024-08-14 DIAGNOSIS — E7849 Other hyperlipidemia: Secondary | ICD-10-CM

## 2024-10-27 ENCOUNTER — Other Ambulatory Visit: Payer: Self-pay

## 2024-10-27 ENCOUNTER — Emergency Department (HOSPITAL_COMMUNITY)

## 2024-10-27 ENCOUNTER — Encounter (HOSPITAL_COMMUNITY): Payer: Self-pay | Admitting: *Deleted

## 2024-10-27 ENCOUNTER — Emergency Department (HOSPITAL_COMMUNITY)
Admission: EM | Admit: 2024-10-27 | Discharge: 2024-10-27 | Disposition: A | Discharge status: Home / Self Care | Attending: Emergency Medicine | Admitting: Emergency Medicine

## 2024-10-27 DIAGNOSIS — Y990 Civilian activity done for income or pay: Secondary | ICD-10-CM | POA: Diagnosis not present

## 2024-10-27 DIAGNOSIS — W228XXA Striking against or struck by other objects, initial encounter: Secondary | ICD-10-CM | POA: Diagnosis not present

## 2024-10-27 DIAGNOSIS — S6992XA Unspecified injury of left wrist, hand and finger(s), initial encounter: Secondary | ICD-10-CM | POA: Diagnosis present

## 2024-10-27 DIAGNOSIS — S60222A Contusion of left hand, initial encounter: Secondary | ICD-10-CM | POA: Insufficient documentation

## 2024-10-27 MED ORDER — HYDROCODONE-ACETAMINOPHEN 5-325 MG PO TABS: 1.0000 | ORAL_TABLET | Freq: Four times a day (QID) | ORAL | 0 refills | Status: DC | PRN

## 2024-10-27 MED ORDER — TRIPLE ANTIBIOTIC 3.5-400-5000 EX OINT
1.0000 | TOPICAL_OINTMENT | Freq: Once | CUTANEOUS | Status: AC
  Administered 2024-10-27: 1 via CUTANEOUS
  Filled 2024-10-27: qty 1

## 2024-10-27 NOTE — ED Triage Notes (Signed)
 Pt was working with a piece of machinery and hit a root causing it to come back and hit his hand   Pt has pain and swelling to left

## 2024-10-27 NOTE — ED Provider Notes (Signed)
 [place] EMERGENCY DEPARTMENT AT [facility] Provider Note   CSN: [id number] Arrival date & time: 10/27/24  9155     Patient presents with: Hand Pain   Tony Doyle is a 53 y.o. male, right-handed male presenting with left hand pain.  He was using an building services engineer yesterday, it struck a root and caused the device to spin around and cause direct blow to his left thumb and hand.  He has had persistent pain with movement which is better at rest.  He denies numbness distal to the injury site.  He has significant pain with attempts at extension and flexion of his left thumb only.  His other fingers are uninvolved in this injury.  He has taken Tylenol  and ibuprofen  800 mg at 2 AM today without significant pain relief.  He has had no other treatments prior to arrival.  He does have a small abrasion of his dorsal hand which occurred with yesterday's injury.  Patient is current with his tetanus.   The history is provided by the patient.       Prior to Admission medications   Medication Sig Start Date End Date Taking? Authorizing Provider  HYDROcodone -acetaminophen  (NORCO/VICODIN) 5-325 MG tablet Take 1 tablet by mouth every 6 (six) hours as needed for severe pain (pain score 7-10). 10/27/24  Yes Jermaine Tholl, Mliss, PA-C  albuterol  (PROVENTIL ) (2.5 MG/3ML) 0.083% nebulizer solution Take 3 mLs (2.5 mg total) by nebulization at bedtime. 03/26/22   Iva Marty Saltness, MD  albuterol  (VENTOLIN  HFA) 108 [phone] Base) MCG/ACT inhaler TAKE 2 PUFFS BY MOUTH EVERY 6 HOURS AS NEEDED FOR WHEEZE OR SHORTNESS OF BREATH 08/18/22   Iva Marty Saltness, MD  atorvastatin  (LIPITOR) 80 MG tablet Take 1 tablet (80 mg total) by mouth daily. 04/14/24   Alphonsa Glendia LABOR, MD  budesonide  (PULMICORT ) 0.5 MG/2ML nebulizer solution TAKE 2 MLS (0.5 MG TOTAL) BY NEBULIZATION DAILY 01/01/24   Ambs, Arlean HERO, FNP  buPROPion  (WELLBUTRIN  SR) 150 MG 12 hr tablet TAKE 1 TABLET BY MOUTH TWICE A DAY 11/20/23   Alphonsa Glendia LABOR, MD  cetirizine   (ZYRTEC ) 10 MG tablet Take 1 tablet (10 mg total) by mouth daily. 07/08/23   Cari Arlean HERO, FNP  Coenzyme Q10 (COQ10) 100 MG CAPS Take by mouth daily.    [provider]  ezetimibe  (ZETIA ) 10 MG tablet Take 1 tablet (10 mg total) by mouth daily. 05/11/24   Alphonsa Glendia LABOR, MD  fluticasone  (FLONASE ) 50 MCG/ACT nasal spray SPRAY 2 SPRAYS INTO EACH NOSTRIL EVERY DAY Patient taking differently: Place 2 sprays into both nostrils daily as needed for rhinitis. 05/01/20   Alphonsa Glendia LABOR, MD  formoterol  (PERFOROMIST ) 20 MCG/2ML nebulizer solution Take 2 mLs (20 mcg total) by nebulization at bedtime. 07/08/23   Cari Arlean HERO, FNP  naproxen  (NAPROSYN ) 500 MG tablet Take 1 tablet (500 mg total) by mouth 2 (two) times daily with a meal. 04/06/24   Luking, Glendia LABOR, MD  nitroGLYCERIN  (NITROSTAT ) 0.4 MG SL tablet Place 1 tablet (0.4 mg total) under the tongue every 5 (five) minutes x 3 doses as needed (if no relief after the 3rd dose proceed to ED or call 911). 05/31/24   Mallipeddi, Vishnu P, MD  predniSONE  (DELTASONE ) 20 MG tablet 3qd for 3d then 2qd for 3d then 1qd for 3d 06/17/24   Alphonsa Glendia LABOR, MD    Allergies: Wellbutrin  [bupropion ], Chantix [varenicline tartrate], and Penicillins    Review of Systems  Constitutional:  Negative for fever.  Musculoskeletal:  Positive for arthralgias. Negative for joint swelling and myalgias.  Skin:  Positive for wound.  Neurological:  Negative for weakness and numbness.    Updated Vital Signs BP 124/65 (BP Location: Right Arm)   Pulse 66   Temp 97.8 F (36.6 C) (Oral)   Resp 18   SpO2 95%   Physical Exam Constitutional:      Appearance: He is well-developed.  HENT:     Head: Atraumatic.  Cardiovascular:     Comments: Pulses equal bilaterally Musculoskeletal:        General: Swelling and tenderness present. No deformity.     Left hand: Swelling and bony tenderness present. No deformity. Decreased range of motion. Normal sensation. Normal capillary refill.  Normal pulse.     Cervical back: Normal range of motion.     Comments: Tender to palpation left proximal thumb at the MCP joint extending through his first metacarpal.  There is mild edema, no deformity.  Distal sensation is intact.  There is a small abrasion inferior to his index finger MCP joint, hemostatic.  Skin:    General: Skin is warm and dry.  Neurological:     Mental Status: He is alert.     Sensory: No sensory deficit.     Motor: No weakness.     (all labs ordered are listed, but only abnormal results are displayed) Labs Reviewed - No data to display  EKG: None  Radiology: DG Hand Complete Left Result Date: 10/27/2024 CLINICAL DATA:  Pain.  Injury. EXAM: LEFT HAND - COMPLETE 3+ VIEW COMPARISON:  10/29/2006. FINDINGS: No acute fracture or dislocation. Remote posttraumatic deformity of the tuft of the fourth distal phalanx. Mild osteoarthritis of the first CMC joint. No radiopaque foreign body. IMPRESSION: 1. No acute osseous abnormality. 2. Mild osteoarthritis of the first CMC joint. Electronically Signed   By: Harrietta Sherry M.D.   On: 10/27/2024 09:46     Procedures   Medications Ordered in the ED  neomycin -bacitracin -polymyxin [phone] OINT 1 Application (has no administration in time range)                                    Medical Decision Making Patient with a direct blow to his left thumb and first MCP area yesterday, with continued pain and swelling today.  Differential diagnosis including fracture, dislocation, acute tendinitis/ligament strain/sprain.  Imaging today is reassuring without fracture or dislocation.  He is placed in a thumb spica splint, we discussed RICE treatment, referral to orthopedics for follow-up care if symptoms are not significantly improving over the next week.  He is neurovascularly intact.  Amount and/or Complexity of Data Reviewed Radiology: ordered.    Details: X-ray reviewed, negative for acute findings, no fracture, no  dislocation.  Risk OTC drugs. Prescription drug management.        Final diagnoses:  Contusion of left hand, initial encounter    ED Discharge Orders          Ordered    HYDROcodone -acetaminophen  (NORCO/VICODIN) 5-325 MG tablet  Every 6 hours PRN        10/27/24 0953               Verline Kong, PA-C 10/27/24 0954    Tony Ozell BROCKS, MD 10/27/24 1719

## 2024-10-27 NOTE — Discharge Instructions (Addendum)
 Your x-rays are negative for fracture or dislocation.  I suspect you may have a deep bruise which can take some time to heal.  Additionally it is possible you have a sprain of your left thumb.  I do recommend using ice is much as is comfortable for the next several days to help reduce swelling.  Wear the splint provided for comfort.  I have prescribed you pain medication that you can take if needed for severe pain, however this will make you drowsy and you cannot drive or operate machinery within 4 hours of taking this medication.  I do encourage you to continue taking your ibuprofen  every 12 hours which can also help reduce inflammation.  I recommend follow-up with Dr. Margrette in a week if your symptoms are not significantly improving with this treatment plan.

## 2024-11-06 ENCOUNTER — Other Ambulatory Visit: Payer: Self-pay | Admitting: Family Medicine

## 2024-11-06 DIAGNOSIS — E7849 Other hyperlipidemia: Secondary | ICD-10-CM

## 2024-11-07 ENCOUNTER — Other Ambulatory Visit: Payer: Self-pay

## 2024-11-07 DIAGNOSIS — F17210 Nicotine dependence, cigarettes, uncomplicated: Secondary | ICD-10-CM

## 2024-11-07 DIAGNOSIS — Z87891 Personal history of nicotine dependence: Secondary | ICD-10-CM

## 2024-11-07 DIAGNOSIS — Z122 Encounter for screening for malignant neoplasm of respiratory organs: Secondary | ICD-10-CM

## 2024-11-11 ENCOUNTER — Ambulatory Visit (HOSPITAL_COMMUNITY): Admission: RE | Admit: 2024-11-11 | Discharge: 2024-11-11 | Attending: Acute Care | Admitting: Acute Care

## 2024-11-11 DIAGNOSIS — Z87891 Personal history of nicotine dependence: Secondary | ICD-10-CM

## 2024-11-11 DIAGNOSIS — Z122 Encounter for screening for malignant neoplasm of respiratory organs: Secondary | ICD-10-CM

## 2024-11-11 DIAGNOSIS — F17210 Nicotine dependence, cigarettes, uncomplicated: Secondary | ICD-10-CM

## 2024-11-18 ENCOUNTER — Telehealth: Payer: Self-pay

## 2024-11-18 DIAGNOSIS — Z87891 Personal history of nicotine dependence: Secondary | ICD-10-CM

## 2024-11-18 DIAGNOSIS — R911 Solitary pulmonary nodule: Secondary | ICD-10-CM

## 2024-11-18 NOTE — Telephone Encounter (Signed)
 LVM to call office and review results.

## 2024-11-18 NOTE — Telephone Encounter (Signed)
 Called report form [facility]   IMPRESSION: 1. Lung-RADS 3, probably benign findings. Short-term follow-up in 6 months is recommended with repeat low-dose chest CT without contrast (please use the following order, CT CHEST LCS NODULE FOLLOW-UP W/O CM). New solid 4.3 mm right upper lobe pulmonary nodule. 2. One vessel coronary atherosclerosis. 3. Aortic Atherosclerosis (ICD10-I70.0) and Emphysema (ICD10-J43.9).

## 2024-11-22 NOTE — Addendum Note (Signed)
 Addended by: ANITRA AQUAS D on: 11/22/2024 10:55 AM   Modules accepted: Orders

## 2024-11-22 NOTE — Telephone Encounter (Signed)
 Spoke with pt and reviewed lung screening results. Nodule seen on previous scan has decreased in size. New small nodule noted. Recommendation is to repeat CT in 6 months. Pt verbalized understanding. Results / plans faxed to PCP. Order placed for 6 month nodule follow up CT.

## 2025-01-05 ENCOUNTER — Encounter: Payer: Self-pay | Admitting: Acute Care

## 2025-01-06 ENCOUNTER — Telehealth: Payer: Self-pay | Admitting: Allergy & Immunology

## 2025-01-06 MED ORDER — ALBUTEROL SULFATE HFA 108 (90 BASE) MCG/ACT IN AERS: 2.0000 | INHALATION_SPRAY | Freq: Four times a day (QID) | RESPIRATORY_TRACT | 0 refills | Status: DC | PRN

## 2025-01-06 NOTE — Telephone Encounter (Signed)
 I sent in an albuterol  inhaler until he comes in for his appointment next time.   Marty Shaggy, MD [facility] 

## 2025-01-12 ENCOUNTER — Ambulatory Visit
Admission: RE | Admit: 2025-01-12 | Discharge: 2025-01-12 | Disposition: A | Discharge status: Home / Self Care | Source: Ambulatory Visit

## 2025-01-12 ENCOUNTER — Ambulatory Visit (INDEPENDENT_AMBULATORY_CARE_PROVIDER_SITE_OTHER)

## 2025-01-12 VITALS — BP 127/82 | HR 66 | Temp 97.9°F | Resp 18

## 2025-01-12 DIAGNOSIS — R0789 Other chest pain: Secondary | ICD-10-CM

## 2025-01-12 DIAGNOSIS — W19XXXA Unspecified fall, initial encounter: Secondary | ICD-10-CM

## 2025-01-12 DIAGNOSIS — R10A1 Flank pain, right side: Secondary | ICD-10-CM | POA: Diagnosis not present

## 2025-01-12 MED ORDER — TIZANIDINE HCL 4 MG PO TABS: 4.0000 mg | ORAL_TABLET | Freq: Three times a day (TID) | ORAL | 0 refills | Status: AC | PRN

## 2025-01-12 NOTE — ED Provider Notes (Signed)
 " [facility]    CSN: [id number] Arrival date & time: 01/12/25  9162      History   Chief Complaint Chief Complaint  Patient presents with   fall, pain to right rib area    HPI Tony Doyle is a 54 y.o. male.   Patient with past medical history of COPD, asthma, hyperlipidemia, and CAD presents with right sided rib and side pain since fall that occurred 3 days ago.  Patient reports that he was walking with his wife when he slipped and fell on the ice and landed directly on his right side.  Patient denies any difficulty breathing or severe abdominal pain.  Patient also denies any notable bruising.  Patient reports that he is now experiencing some pain with coughing or or movement.  Patient reports he has been taking Tylenol  with minimal relief.  Patient denies any other injuries from fall.  Patient denies hitting his head or loss of consciousness.  Patient also denies taking any blood thinners.    The history is provided by the patient and medical records.    Past Medical History:  Diagnosis Date   Allergy    Asthma    COPD (chronic obstructive pulmonary disease) (HCC)    Hypercholesterolemia    Pneumonia     Patient Active Problem List   Diagnosis Date Noted   Elevated coronary artery calcium  score 05/31/2024   Coronary artery disease 05/20/2024   Heterozygous alpha 1-antitrypsin deficiency (HCC) 07/08/2023   Asthma with COPD (HCC) 09/12/2020   Hyperlipidemia 12/13/2018   Non-cardiac chest pain 02/22/2014   Smoker 02/22/2014    Past Surgical History:  Procedure Laterality Date   arm surgery     LEFT   COLONOSCOPY WITH PROPOFOL  N/A 10/12/2020   Procedure: COLONOSCOPY WITH PROPOFOL ;  Surgeon: Cindie Carlin POUR, DO;  Location: [facility];  Service: Endoscopy;  Laterality: N/A;  9:30   LEG SURGERY     LEFT       Home Medications    Prior to Admission medications  Medication Sig Start Date End Date Taking? Authorizing Provider  tiZANidine   (ZANAFLEX ) 4 MG tablet Take 1 tablet (4 mg total) by mouth every 8 (eight) hours as needed for muscle spasms. 01/12/25  Yes Johnie, Magdelyn Roebuck A, NP  albuterol  (PROVENTIL ) (2.5 MG/3ML) 0.083% nebulizer solution Take 3 mLs (2.5 mg total) by nebulization at bedtime. 03/26/22   Iva Marty Saltness, MD  albuterol  (VENTOLIN  HFA) 108 [phone] Base) MCG/ACT inhaler TAKE 2 PUFFS BY MOUTH EVERY 6 HOURS AS NEEDED FOR WHEEZE OR SHORTNESS OF BREATH 08/18/22   Iva Marty Saltness, MD  albuterol  (VENTOLIN  HFA) 108 [phone] Base) MCG/ACT inhaler Inhale 2 puffs into the lungs every 6 (six) hours as needed. 01/06/25   Iva Marty Saltness, MD  atorvastatin  (LIPITOR) 80 MG tablet TAKE 1 TABLET BY MOUTH EVERY DAY 11/07/24   Alphonsa Glendia LABOR, MD  budesonide  (PULMICORT ) 0.5 MG/2ML nebulizer solution TAKE 2 MLS (0.5 MG TOTAL) BY NEBULIZATION DAILY 01/01/24   Ambs, Arlean HERO, FNP  buPROPion  (WELLBUTRIN  SR) 150 MG 12 hr tablet TAKE 1 TABLET BY MOUTH TWICE A DAY 11/20/23   Alphonsa Glendia LABOR, MD  cetirizine  (ZYRTEC ) 10 MG tablet Take 1 tablet (10 mg total) by mouth daily. 07/08/23   Cari Arlean HERO, FNP  Coenzyme Q10 (COQ10) 100 MG CAPS Take by mouth daily.    [provider]  ezetimibe  (ZETIA ) 10 MG tablet Take 1 tablet (10 mg total) by mouth daily. 05/11/24  Alphonsa Glendia LABOR, MD  fluticasone  (FLONASE ) 50 MCG/ACT nasal spray SPRAY 2 SPRAYS INTO EACH NOSTRIL EVERY DAY Patient taking differently: Place 2 sprays into both nostrils daily as needed for rhinitis. 05/01/20   Alphonsa Glendia LABOR, MD  formoterol  (PERFOROMIST ) 20 MCG/2ML nebulizer solution Take 2 mLs (20 mcg total) by nebulization at bedtime. 07/08/23   Cari Arlean HERO, FNP  naproxen  (NAPROSYN ) 500 MG tablet Take 1 tablet (500 mg total) by mouth 2 (two) times daily with a meal. 04/06/24   Luking, Glendia LABOR, MD  nitroGLYCERIN  (NITROSTAT ) 0.4 MG SL tablet Place 1 tablet (0.4 mg total) under the tongue every 5 (five) minutes x 3 doses as needed (if no relief after the 3rd dose proceed to ED or call  911). 05/31/24   Mallipeddi, Diannah SQUIBB, MD    Family History Family History  Problem Relation Age of Onset   Asthma Father    Emphysema Father        smoked   Asthma Mother    Emphysema Mother        smoked   Eczema Brother    Allergic rhinitis Neg Hx    Angioedema Neg Hx    Atopy Neg Hx    Immunodeficiency Neg Hx    Urticaria Neg Hx     Social History Social History[1]   Allergies   Wellbutrin  [bupropion ], Chantix [varenicline tartrate], and Penicillins   Review of Systems Review of Systems  Per HPI  Physical Exam Triage Vital Signs ED Triage Vitals  Encounter Vitals Group     BP 01/12/25 0857 127/82     Girls Systolic BP Percentile --      Girls Diastolic BP Percentile --      Boys Systolic BP Percentile --      Boys Diastolic BP Percentile --      Pulse Rate 01/12/25 0857 66     Resp 01/12/25 0857 18     Temp 01/12/25 0857 97.9 F (36.6 C)     Temp Source 01/12/25 0857 Oral     SpO2 01/12/25 0857 93 %     Weight --      Height --      Head Circumference --      Peak Flow --      Pain Score 01/12/25 0859 8     Pain Loc --      Pain Education --      Exclude from Growth Chart --    No data found.  Updated Vital Signs BP 127/82 (BP Location: Right Arm)   Pulse 66   Temp 97.9 F (36.6 C) (Oral)   Resp 18   SpO2 93%   Visual Acuity Right Eye Distance:   Left Eye Distance:   Bilateral Distance:    Right Eye Near:   Left Eye Near:    Bilateral Near:     Physical Exam Vitals and nursing note reviewed.  Constitutional:      General: He is awake. He is not in acute distress.    Appearance: Normal appearance. He is well-developed and well-groomed. He is not ill-appearing.  Cardiovascular:     Rate and Rhythm: Normal rate and regular rhythm.  Pulmonary:     Effort: Pulmonary effort is normal.     Breath sounds: Normal breath sounds.  Chest:     Chest wall: Tenderness present.  Abdominal:     General: There is no distension.      Palpations: Abdomen is soft.  Tenderness: There is no right CVA tenderness, left CVA tenderness, guarding or rebound.      Comments: Tenderness noted to right lateral chest wall extending into right abdominal wall.  Without bruising or obvious deformity.  Skin:    General: Skin is warm and dry.  Neurological:     General: No focal deficit present.     Mental Status: He is alert and oriented to person, place, and time.  Psychiatric:        Behavior: Behavior is cooperative.      UC Treatments / Results  Labs (all labs ordered are listed, but only abnormal results are displayed) Labs Reviewed - No data to display  EKG   Radiology DG Ribs Unilateral W/Chest Right Result Date: 01/12/2025 CLINICAL DATA:  Right rib pain after fall 3 days ago EXAM: RIGHT RIBS AND CHEST - 3+ VIEW COMPARISON:  November 28, 2022 FINDINGS: No fracture or other bone lesions are seen involving the ribs. There is no evidence of pneumothorax or pleural effusion. Both lungs are clear. Heart size and mediastinal contours are within normal limits. IMPRESSION: Negative. Electronically Signed   By: Lynwood Landy Raddle M.D.   On: 01/12/2025 09:41    Procedures Procedures (including critical care time)  Medications Ordered in UC Medications - No data to display  Initial Impression / Assessment and Plan / UC Course  I have reviewed the triage vital signs and the nursing notes.  Pertinent labs & imaging results that were available during my care of the patient were reviewed by me and considered in my medical decision making (see chart for details).     Patient is overall well-appearing.  Vitals are stable.  X-ray ordered.  I independently interpreted these images and there is no acute fracture or underlying injury noted.  Radiology report confirms this.  Pain likely muscular or related to contusion.  Prescribed tizanidine  for muscle pain and spasms.  Recommended Tylenol  and ibuprofen  as needed for pain.  Discussed  follow-up and return precautions. Final Clinical Impressions(s) / UC Diagnoses   Final diagnoses:  Right-sided chest wall pain  Right flank pain  Fall, initial encounter     Discharge Instructions      Your x-ray does not reveal any underlying fractures or injury. As discussed I believe your pain is likely muscular in nature. You can take tizanidine  every 8 hours as needed for muscle pain and spasms. Otherwise you can alternate between 400 to 600 mg of ibuprofen  or 500 to 1000 mg of Tylenol  every 6-8 hours as needed for pain. Alternate between ice and heat as needed for pain. Follow-up with your primary care provider or return here as needed.     ED Prescriptions     Medication Sig Dispense Auth. Provider   tiZANidine  (ZANAFLEX ) 4 MG tablet Take 1 tablet (4 mg total) by mouth every 8 (eight) hours as needed for muscle spasms. 30 tablet Johnie Flaming A, NP      PDMP not reviewed this encounter.     [1]  Social History Tobacco Use   Smoking status: Every Day    Current packs/day: 0.50    Average packs/day: 0.5 packs/day for 38.1 years (19.0 ttl pk-yrs)    Types: Cigarettes    Start date: 34   Smokeless tobacco: Never   Tobacco comments:    1/2 pack per day   Vaping Use   Vaping status: Never Used  Substance Use Topics   Alcohol  use: No   Drug use: No  Johnie Flaming A, NP 01/12/25 [phone]  "

## 2025-01-12 NOTE — Discharge Instructions (Addendum)
 Your x-ray does not reveal any underlying fractures or injury. As discussed I believe your pain is likely muscular in nature. You can take tizanidine  every 8 hours as needed for muscle pain and spasms. Otherwise you can alternate between 400 to 600 mg of ibuprofen  or 500 to 1000 mg of Tylenol  every 6-8 hours as needed for pain. Alternate between ice and heat as needed for pain. Follow-up with your primary care provider or return here as needed.

## 2025-01-12 NOTE — ED Triage Notes (Signed)
 Fell 3 days ago on ice and landed on right side.  Has pain at right rib area.  States hurts to cough and move certain ways.  Has been taking tylenol  for pain.

## 2025-01-13 ENCOUNTER — Ambulatory Visit: Admitting: Allergy & Immunology

## 2025-01-13 ENCOUNTER — Encounter: Payer: Self-pay | Admitting: Allergy & Immunology

## 2025-01-13 VITALS — BP 120/70 | HR 74 | Temp 97.6°F | Resp 12 | Ht 67.72 in | Wt 219.0 lb

## 2025-01-13 DIAGNOSIS — R0789 Other chest pain: Secondary | ICD-10-CM

## 2025-01-13 DIAGNOSIS — J310 Chronic rhinitis: Secondary | ICD-10-CM

## 2025-01-13 DIAGNOSIS — J4489 Other specified chronic obstructive pulmonary disease: Secondary | ICD-10-CM

## 2025-01-13 DIAGNOSIS — F17200 Nicotine dependence, unspecified, uncomplicated: Secondary | ICD-10-CM

## 2025-01-13 MED ORDER — BUDESONIDE 0.5 MG/2ML IN SUSP: 0.5000 mg | Freq: Every day | RESPIRATORY_TRACT | 1 refills | Status: AC

## 2025-01-13 MED ORDER — ALBUTEROL SULFATE (2.5 MG/3ML) 0.083% IN NEBU: 2.5000 mg | INHALATION_SOLUTION | Freq: Every evening | RESPIRATORY_TRACT | 2 refills | Status: AC

## 2025-01-13 MED ORDER — MELOXICAM 15 MG PO TABS: 15.0000 mg | ORAL_TABLET | Freq: Every day | ORAL | 0 refills | Status: AC

## 2025-01-13 MED ORDER — FORMOTEROL FUMARATE 20 MCG/2ML IN NEBU: 20.0000 ug | INHALATION_SOLUTION | Freq: Every day | RESPIRATORY_TRACT | 5 refills | Status: AC

## 2025-01-13 MED ORDER — ALBUTEROL SULFATE HFA 108 (90 BASE) MCG/ACT IN AERS: 2.0000 | INHALATION_SPRAY | Freq: Four times a day (QID) | RESPIRATORY_TRACT | 0 refills | Status: AC | PRN

## 2025-01-13 NOTE — Progress Notes (Signed)
 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  We looked at pictures of the nebulizer medications and he definitely felt like Pulmicort  was one of the nebulized solutions he uses.  Regardless, he seems to use them only on a as needed basis.  This regimen has kept him off of prednisone  and out of the hospital, so we will just continue with that for now.  Affordability has been an issue for him in the past, so I question whether cost of the drugs are one of the issues with his compliance.  He is getting regular chest CTs which is reassuring.  I did not do a repeat alpha 1 antitrypsin level, as we know his genotype already and he would not be a candidate for replacement anyway given the fact that he is a current smoker.  Thankfully, his level has been normal in the past.  Plan/Recommendations:   1. Moderate persistent asthma, uncomplicated - Lung testing looked a little lower today, but this is likely because of your rib injury.   - You seem to do ok without a daily controller medication.  - Keep up with the chest CT screening.  - Daily controller medication(s): NOTHING - Prior to physical activity: albuterol  2 puffs 10-15 minutes before physical activity. - Rescue medications: albuterol  4 puffs every 4-6 hours as needed or albuterol  nebulizer one vial every 4-6 hours as needed - During respiratory flares: ADD ON Pulmicort  + Perforomist  mixed together via nebulizer TWICE DAILY. - Asthma control goals:  * Full  participation in all desired activities (may need albuterol  before activity) * Albuterol  use two time or less a week on average (not counting use with activity) * Cough interfering with sleep two time or less a month * Oral steroids no more than once a year * No hospitalizations  2. Chronic non-allergic rhinitis - Continue with: Flonase  (fluticasone ) one spray per nostril daily (but use EVERY DAY for the best effect) and Zyrtec  (cetirizine ) 10mg  tablet once daily (DURING WORST TIMES OF THE YEAR ONLY) - Consider nasal saline rinses 1-2 times daily to remove allergens from the nasal cavities as well as help with mucous clearance (this is especially helpful to do before the nasal sprays are given)  3. Return in about 1 year (around 01/13/2026). You can have the follow up appointment with Dr. Iva or a Nurse Practicioner (our Nurse Practitioners are excellent and always have Physician oversight!).    Subjective:   Tony Doyle is a 54 y.o. male presenting today for follow up of  Chief Complaint  Patient presents with   Follow-up    Tony Doyle has a history of the following: Patient Active Problem List   Diagnosis Date Noted   Elevated coronary artery calcium  score 05/31/2024   Coronary artery disease 05/20/2024   Heterozygous alpha 1-antitrypsin deficiency (HCC) 07/08/2023   Asthma with COPD (HCC) 09/12/2020   Hyperlipidemia 12/13/2018   Non-cardiac chest pain 02/22/2014  Smoker 02/22/2014    History obtained from: chart review and patient.  Discussed the use of AI scribe software for clinical note transcription with the patient and/or guardian, who gave verbal consent to proceed.  Tony Doyle is a 54 y.o. male presenting for a follow up visit.  He was last seen in July 2024 by Tony Doyle, one of our nurse practitioners.  At that time, we continued with Pulmicort  mixed with Perforomist  once a day via nebulizer.  Does continue with albuterol  as needed.  For his nonallergic rhinitis,  he was continued on Zyrtec  as well as Flonase .  Smoking cessation was discussed.  He was asked to follow-up in 3 months but presents now around 18 months later.  Since the last visit, he has done well.   Asthma/Respiratory Symptom History: He remains on his medications on a as needed basis.  He definitely has albuterol  that he uses in his nebulizer machine, but is not sure what other medications he uses with his machine.  Regardless, he has not needed any prednisone  and has not been to the emergency room for breathing.  He is largely doing pretty well.  He did recently slip on the ice and has some broken ribs.  He went to urgent care yesterday and was given Zanaflex  to use as needed and encouraged to do Tylenol .  Chest CT results (December 2025) 1. Lung-RADS 3, probably benign findings. Short-term follow-up in 6 months is recommended with repeat low-dose chest CT without contrast (please use the following order, CT CHEST LCS NODULE FOLLOW-UP W/O CM). New solid 4.3 mm right upper lobe pulmonary nodule. 2. One vessel coronary atherosclerosis. 3. Aortic Atherosclerosis (ICD10-I70.0) and Emphysema (ICD10-J43.9).   Allergic Rhinitis Symptom History: He has a nose spray, but does not use it regularly.  He also has Zyrtec  which he does not use on a regular.  He has not needed any prednisone  at all.  Reviewed the note after he saw us  and his x-ray actually did not show any fractures.  They felt this was mostly musculoskeletal.  Otherwise, there have been no changes to his past medical history, surgical history, family history, or social history.    Review of systems otherwise negative other than that mentioned in the HPI.    Objective:   Blood pressure 120/70, pulse 74, temperature 97.6 F (36.4 C), resp. rate 12, height 5' 7.72 (1.72 m), weight 219 lb (99.3 kg), SpO2 95%. Body mass index is 33.58 kg/m.    Physical Exam Vitals reviewed.  Constitutional:      Appearance: He is  well-developed.     Comments: Laughing and boisterous. Asking whether I have his Happy Meal.   HENT:     Head: Normocephalic and atraumatic.     Right Ear: Tympanic membrane, ear canal and external ear normal.     Left Ear: Tympanic membrane, ear canal and external ear normal.     Nose: No nasal deformity, septal deviation, mucosal edema or rhinorrhea.     Right Turbinates: Enlarged, swollen and pale.     Left Turbinates: Enlarged, swollen and pale.     Right Sinus: No maxillary sinus tenderness or frontal sinus tenderness.     Left Sinus: No maxillary sinus tenderness or frontal sinus tenderness.     Comments: No polyps.    Mouth/Throat:     Mouth: Mucous membranes are not pale and not dry.     Pharynx: Uvula midline.  Eyes:     General: Lids are normal. Allergic shiner  present.        Right eye: No discharge.        Left eye: No discharge.     Conjunctiva/sclera: Conjunctivae normal.     Right eye: Right conjunctiva is not injected. No chemosis.    Left eye: Left conjunctiva is not injected. No chemosis.    Pupils: Pupils are equal, round, and reactive to light.  Cardiovascular:     Rate and Rhythm: Normal rate and regular rhythm.     Heart sounds: Normal heart sounds.  Pulmonary:     Effort: Pulmonary effort is normal. No tachypnea, accessory muscle usage or respiratory distress.     Breath sounds: Examination of the right-lower field reveals decreased breath sounds. Examination of the left-lower field reveals decreased breath sounds. Decreased breath sounds present. No wheezing, rhonchi or rales.  Chest:     Chest wall: No tenderness.  Lymphadenopathy:     Cervical: No cervical adenopathy.  Skin:    Coloration: Skin is not pale.     Findings: No abrasion, erythema, petechiae or rash. Rash is not papular, urticarial or vesicular.  Neurological:     Mental Status: He is alert.  Psychiatric:        Behavior: Behavior is cooperative.      Diagnostic studies:     Spirometry: results abnormal (FEV1: 1.62/48%, FVC: 2.60/60%, FEV1/FVC: 62%).    Spirometry consistent with moderate obstructive disease.    Allergy Studies: none      Marty Shaggy, MD  [facility]        "

## 2025-01-13 NOTE — Patient Instructions (Addendum)
 1. Moderate persistent asthma, uncomplicated - Lung testing looked a little lower today, but this is likely because of your rib injury.   - You seem to do ok without a daily controller medication.  - Keep up with the chest CT screening.  - Daily controller medication(s): NOTHING - Prior to physical activity: albuterol  2 puffs 10-15 minutes before physical activity. - Rescue medications: albuterol  4 puffs every 4-6 hours as needed or albuterol  nebulizer one vial every 4-6 hours as needed - During respiratory flares: ADD ON Pulmicort  + Perforomist  mixed together via nebulizer TWICE DAILY. - Asthma control goals:  * Full participation in all desired activities (may need albuterol  before activity) * Albuterol  use two time or less a week on average (not counting use with activity) * Cough interfering with sleep two time or less a month * Oral steroids no more than once a year * No hospitalizations  2. Chronic non-allergic rhinitis - Continue with: Flonase  (fluticasone ) one spray per nostril daily (but use EVERY DAY for the best effect) and Zyrtec  (cetirizine ) 10mg  tablet once daily (DURING WORST TIMES OF THE YEAR ONLY) - Consider nasal saline rinses 1-2 times daily to remove allergens from the nasal cavities as well as help with mucous clearance (this is especially helpful to do before the nasal sprays are given)  3. Return in about 1 year (around 01/13/2026). You can have the follow up appointment with Dr. Iva or a Nurse Practicioner (our Nurse Practitioners are excellent and always have Physician oversight!).    Please inform us  of any Emergency Department visits, hospitalizations, or changes in symptoms. Call us  before going to the ED for breathing or allergy symptoms since we might be able to fit you in for a sick visit. Feel free to contact us  anytime with any questions, problems, or concerns.  It was a pleasure to see you again today!  Websites that have reliable patient  information: 1. American Academy of Asthma, Allergy, and Immunology: www.aaaai.org 2. Food Allergy Research and Education (FARE): foodallergy.org 3. Mothers of Asthmatics: http://www.asthmacommunitynetwork.org 4. American College of Allergy, Asthma, and Immunology: www.acaai.org      Like us  on [employer] and Instagram for our latest updates!      A healthy democracy works best when [employer] participate! Make sure you are registered to vote! If you have moved or changed any of your contact information, you will need to get this updated before voting! Scan the QR codes below to learn more!

## 2025-05-12 ENCOUNTER — Encounter (HOSPITAL_COMMUNITY)

## 2026-01-17 ENCOUNTER — Ambulatory Visit: Admitting: Allergy & Immunology
